# Patient Record
Sex: Female | Born: 1935
Health system: Southern US, Community
[De-identification: ages and names within clinical notes are randomized; demographics above are authoritative.]

## PROBLEM LIST (undated history)

## (undated) DIAGNOSIS — I10 Essential (primary) hypertension: Secondary | ICD-10-CM

## (undated) DIAGNOSIS — N200 Calculus of kidney: Secondary | ICD-10-CM

## (undated) DIAGNOSIS — I2510 Atherosclerotic heart disease of native coronary artery without angina pectoris: Secondary | ICD-10-CM

## (undated) DIAGNOSIS — Z8619 Personal history of other infectious and parasitic diseases: Secondary | ICD-10-CM

## (undated) DIAGNOSIS — M1990 Unspecified osteoarthritis, unspecified site: Secondary | ICD-10-CM

## (undated) DIAGNOSIS — R42 Dizziness and giddiness: Secondary | ICD-10-CM

## (undated) DIAGNOSIS — E785 Hyperlipidemia, unspecified: Secondary | ICD-10-CM

## (undated) DIAGNOSIS — C50919 Malignant neoplasm of unspecified site of unspecified female breast: Secondary | ICD-10-CM

## (undated) DIAGNOSIS — E119 Type 2 diabetes mellitus without complications: Secondary | ICD-10-CM

## (undated) DIAGNOSIS — K449 Diaphragmatic hernia without obstruction or gangrene: Secondary | ICD-10-CM

## (undated) DIAGNOSIS — I493 Ventricular premature depolarization: Secondary | ICD-10-CM

## (undated) DIAGNOSIS — K5790 Diverticulosis of intestine, part unspecified, without perforation or abscess without bleeding: Secondary | ICD-10-CM

## (undated) HISTORY — DX: Malignant neoplasm of unspecified site of unspecified female breast: C50.919

## (undated) HISTORY — DX: Atherosclerotic heart disease of native coronary artery without angina pectoris: I25.10

## (undated) HISTORY — DX: Calculus of kidney: N20.0

## (undated) HISTORY — PX: OTHER SURGICAL HISTORY: SHX169

## (undated) HISTORY — DX: Diaphragmatic hernia without obstruction or gangrene: K44.9

## (undated) HISTORY — PX: JOINT REPLACEMENT: SHX530

## (undated) HISTORY — DX: Ventricular premature depolarization: I49.3

## (undated) HISTORY — DX: Diverticulosis of intestine, part unspecified, without perforation or abscess without bleeding: K57.90

## (undated) HISTORY — DX: Essential (primary) hypertension: I10

## (undated) HISTORY — DX: Type 2 diabetes mellitus without complications: E11.9

## (undated) HISTORY — PX: CHOLECYSTECTOMY: SHX55

## (undated) HISTORY — DX: Hyperlipidemia, unspecified: E78.5

---

## 1999-02-13 DIAGNOSIS — C50919 Malignant neoplasm of unspecified site of unspecified female breast: Secondary | ICD-10-CM

## 1999-02-13 HISTORY — DX: Malignant neoplasm of unspecified site of unspecified female breast: C50.919

## 1999-02-13 HISTORY — PX: BREAST LUMPECTOMY: SHX2

## 1999-04-04 ENCOUNTER — Encounter: Payer: Self-pay | Admitting: Emergency Medicine

## 1999-04-04 ENCOUNTER — Emergency Department (HOSPITAL_COMMUNITY): Admission: EM | Admit: 1999-04-04 | Discharge: 1999-04-04 | Payer: Self-pay | Admitting: Emergency Medicine

## 1999-06-21 ENCOUNTER — Encounter: Admission: RE | Admit: 1999-06-21 | Discharge: 1999-07-18 | Payer: Self-pay | Admitting: Orthopedic Surgery

## 1999-10-18 ENCOUNTER — Encounter: Admission: RE | Admit: 1999-10-18 | Discharge: 1999-10-18 | Payer: Self-pay

## 1999-10-24 ENCOUNTER — Other Ambulatory Visit: Admission: RE | Admit: 1999-10-24 | Discharge: 1999-10-24 | Payer: Self-pay

## 1999-10-24 ENCOUNTER — Encounter: Admission: RE | Admit: 1999-10-24 | Discharge: 1999-10-24 | Payer: Self-pay | Admitting: Gynecology

## 1999-10-31 ENCOUNTER — Encounter: Admission: RE | Admit: 1999-10-31 | Discharge: 1999-10-31 | Payer: Self-pay | Admitting: General Surgery

## 1999-10-31 ENCOUNTER — Encounter: Payer: Self-pay | Admitting: General Surgery

## 1999-11-01 ENCOUNTER — Encounter: Payer: Self-pay | Admitting: General Surgery

## 1999-11-01 ENCOUNTER — Ambulatory Visit (HOSPITAL_BASED_OUTPATIENT_CLINIC_OR_DEPARTMENT_OTHER): Admission: RE | Admit: 1999-11-01 | Discharge: 1999-11-01 | Payer: Self-pay | Admitting: General Surgery

## 1999-11-01 ENCOUNTER — Encounter (INDEPENDENT_AMBULATORY_CARE_PROVIDER_SITE_OTHER): Payer: Self-pay | Admitting: *Deleted

## 1999-11-15 ENCOUNTER — Encounter: Admission: RE | Admit: 1999-11-15 | Discharge: 2000-02-13 | Payer: Self-pay | Admitting: Radiation Oncology

## 1999-11-17 ENCOUNTER — Ambulatory Visit (HOSPITAL_COMMUNITY): Admission: RE | Admit: 1999-11-17 | Discharge: 1999-11-17 | Payer: Self-pay | Admitting: *Deleted

## 1999-11-17 ENCOUNTER — Encounter: Payer: Self-pay | Admitting: *Deleted

## 1999-11-20 ENCOUNTER — Encounter: Payer: Self-pay | Admitting: *Deleted

## 1999-11-20 ENCOUNTER — Ambulatory Visit (HOSPITAL_COMMUNITY): Admission: RE | Admit: 1999-11-20 | Discharge: 1999-11-20 | Payer: Self-pay | Admitting: *Deleted

## 1999-11-21 ENCOUNTER — Encounter: Admission: RE | Admit: 1999-11-21 | Discharge: 2000-02-19 | Payer: Self-pay | Admitting: Dentistry

## 1999-11-22 ENCOUNTER — Encounter (HOSPITAL_COMMUNITY): Payer: Self-pay | Admitting: Dentistry

## 1999-12-04 ENCOUNTER — Ambulatory Visit (HOSPITAL_BASED_OUTPATIENT_CLINIC_OR_DEPARTMENT_OTHER): Admission: RE | Admit: 1999-12-04 | Discharge: 1999-12-04 | Payer: Self-pay | Admitting: General Surgery

## 1999-12-04 ENCOUNTER — Encounter: Payer: Self-pay | Admitting: General Surgery

## 2000-05-04 ENCOUNTER — Encounter: Admission: RE | Admit: 2000-05-04 | Discharge: 2000-08-02 | Payer: Self-pay | Admitting: Radiation Oncology

## 2000-05-14 ENCOUNTER — Encounter: Admission: RE | Admit: 2000-05-14 | Discharge: 2000-05-14 | Payer: Self-pay | Admitting: Radiation Oncology

## 2000-05-27 ENCOUNTER — Ambulatory Visit (HOSPITAL_BASED_OUTPATIENT_CLINIC_OR_DEPARTMENT_OTHER): Admission: RE | Admit: 2000-05-27 | Discharge: 2000-05-27 | Payer: Self-pay | Admitting: General Surgery

## 2000-10-03 ENCOUNTER — Other Ambulatory Visit: Admission: RE | Admit: 2000-10-03 | Discharge: 2000-10-03 | Payer: Self-pay | Admitting: Obstetrics and Gynecology

## 2000-10-21 ENCOUNTER — Encounter: Admission: RE | Admit: 2000-10-21 | Discharge: 2000-10-21 | Payer: Self-pay | Admitting: Surgery

## 2000-10-21 ENCOUNTER — Encounter (HOSPITAL_COMMUNITY): Payer: Self-pay | Admitting: Oncology

## 2000-11-15 ENCOUNTER — Encounter (HOSPITAL_COMMUNITY): Payer: Self-pay | Admitting: Oncology

## 2000-11-15 ENCOUNTER — Ambulatory Visit (HOSPITAL_COMMUNITY): Admission: RE | Admit: 2000-11-15 | Discharge: 2000-11-15 | Payer: Self-pay | Admitting: Oncology

## 2001-02-12 HISTORY — PX: BREAST LUMPECTOMY WITH AXILLARY LYMPH NODE DISSECTION: SHX5756

## 2001-07-16 ENCOUNTER — Ambulatory Visit (HOSPITAL_COMMUNITY): Admission: RE | Admit: 2001-07-16 | Discharge: 2001-07-16 | Payer: Self-pay | Admitting: *Deleted

## 2001-07-30 ENCOUNTER — Encounter: Payer: Self-pay | Admitting: Orthopedic Surgery

## 2001-07-30 ENCOUNTER — Inpatient Hospital Stay (HOSPITAL_COMMUNITY): Admission: RE | Admit: 2001-07-30 | Discharge: 2001-08-07 | Payer: Self-pay | Admitting: Orthopedic Surgery

## 2001-09-19 ENCOUNTER — Encounter (HOSPITAL_COMMUNITY): Payer: Self-pay | Admitting: Oncology

## 2001-09-19 ENCOUNTER — Ambulatory Visit (HOSPITAL_COMMUNITY): Admission: RE | Admit: 2001-09-19 | Discharge: 2001-09-19 | Payer: Self-pay | Admitting: Oncology

## 2001-11-03 ENCOUNTER — Encounter (HOSPITAL_COMMUNITY): Payer: Self-pay | Admitting: Oncology

## 2001-11-03 ENCOUNTER — Encounter: Admission: RE | Admit: 2001-11-03 | Discharge: 2001-11-03 | Payer: Self-pay | Admitting: Oncology

## 2002-09-22 ENCOUNTER — Ambulatory Visit (HOSPITAL_COMMUNITY): Admission: RE | Admit: 2002-09-22 | Discharge: 2002-09-22 | Payer: Self-pay | Admitting: Oncology

## 2002-09-22 ENCOUNTER — Encounter (HOSPITAL_COMMUNITY): Payer: Self-pay | Admitting: Oncology

## 2002-10-02 ENCOUNTER — Encounter (HOSPITAL_COMMUNITY): Payer: Self-pay | Admitting: Oncology

## 2002-10-02 ENCOUNTER — Ambulatory Visit (HOSPITAL_COMMUNITY): Admission: RE | Admit: 2002-10-02 | Discharge: 2002-10-02 | Payer: Self-pay | Admitting: Oncology

## 2002-11-11 ENCOUNTER — Encounter (HOSPITAL_COMMUNITY): Payer: Self-pay | Admitting: Oncology

## 2002-11-11 ENCOUNTER — Encounter: Admission: RE | Admit: 2002-11-11 | Discharge: 2002-11-11 | Payer: Self-pay | Admitting: Oncology

## 2003-09-21 ENCOUNTER — Ambulatory Visit (HOSPITAL_COMMUNITY): Admission: RE | Admit: 2003-09-21 | Discharge: 2003-09-21 | Payer: Self-pay | Admitting: Oncology

## 2003-11-15 ENCOUNTER — Encounter: Admission: RE | Admit: 2003-11-15 | Discharge: 2003-11-15 | Payer: Self-pay | Admitting: Oncology

## 2004-03-17 ENCOUNTER — Ambulatory Visit: Payer: Self-pay | Admitting: Oncology

## 2004-09-15 ENCOUNTER — Ambulatory Visit: Payer: Self-pay | Admitting: Oncology

## 2004-09-18 ENCOUNTER — Ambulatory Visit (HOSPITAL_COMMUNITY): Admission: RE | Admit: 2004-09-18 | Discharge: 2004-09-18 | Payer: Self-pay | Admitting: Oncology

## 2004-11-14 ENCOUNTER — Encounter: Admission: RE | Admit: 2004-11-14 | Discharge: 2004-11-14 | Payer: Self-pay | Admitting: Oncology

## 2005-03-28 ENCOUNTER — Ambulatory Visit: Payer: Self-pay | Admitting: Oncology

## 2005-04-17 ENCOUNTER — Ambulatory Visit (HOSPITAL_COMMUNITY): Admission: RE | Admit: 2005-04-17 | Discharge: 2005-04-17 | Payer: Self-pay | Admitting: Oncology

## 2005-10-09 ENCOUNTER — Ambulatory Visit: Payer: Self-pay | Admitting: Oncology

## 2005-10-11 LAB — CBC WITH DIFFERENTIAL/PLATELET
Basophils Absolute: 0 10*3/uL (ref 0.0–0.1)
EOS%: 3 % (ref 0.0–7.0)
HGB: 12.8 g/dL (ref 11.6–15.9)
LYMPH%: 24.7 % (ref 14.0–48.0)
MCH: 31.8 pg (ref 26.0–34.0)
MCV: 92.7 fL (ref 81.0–101.0)
MONO%: 7.7 % (ref 0.0–13.0)
RBC: 4.04 10*6/uL (ref 3.70–5.32)
RDW: 13.5 % (ref 11.3–14.5)

## 2005-10-11 LAB — COMPREHENSIVE METABOLIC PANEL
AST: 22 U/L (ref 0–37)
Albumin: 4.4 g/dL (ref 3.5–5.2)
Alkaline Phosphatase: 93 U/L (ref 39–117)
BUN: 19 mg/dL (ref 6–23)
Potassium: 4 mEq/L (ref 3.5–5.3)
Total Bilirubin: 0.5 mg/dL (ref 0.3–1.2)

## 2005-10-17 ENCOUNTER — Ambulatory Visit (HOSPITAL_COMMUNITY): Admission: RE | Admit: 2005-10-17 | Discharge: 2005-10-17 | Payer: Self-pay | Admitting: Oncology

## 2005-11-16 ENCOUNTER — Encounter: Admission: RE | Admit: 2005-11-16 | Discharge: 2005-11-16 | Payer: Self-pay | Admitting: Oncology

## 2006-04-08 ENCOUNTER — Ambulatory Visit: Payer: Self-pay | Admitting: Oncology

## 2006-04-11 LAB — CBC WITH DIFFERENTIAL/PLATELET
Basophils Absolute: 0 10*3/uL (ref 0.0–0.1)
Eosinophils Absolute: 0.1 10*3/uL (ref 0.0–0.5)
HCT: 38.3 % (ref 34.8–46.6)
HGB: 13.3 g/dL (ref 11.6–15.9)
LYMPH%: 25.3 % (ref 14.0–48.0)
MCV: 90.6 fL (ref 81.0–101.0)
MONO#: 0.3 10*3/uL (ref 0.1–0.9)
MONO%: 5.6 % (ref 0.0–13.0)
NEUT#: 3.2 10*3/uL (ref 1.5–6.5)
NEUT%: 66.3 % (ref 39.6–76.8)
Platelets: 311 10*3/uL (ref 145–400)
RBC: 4.22 10*6/uL (ref 3.70–5.32)

## 2006-04-11 LAB — COMPREHENSIVE METABOLIC PANEL
Alkaline Phosphatase: 105 U/L (ref 39–117)
BUN: 17 mg/dL (ref 6–23)
CO2: 24 mEq/L (ref 19–32)
Glucose, Bld: 190 mg/dL — ABNORMAL HIGH (ref 70–99)
Sodium: 141 mEq/L (ref 135–145)
Total Bilirubin: 0.7 mg/dL (ref 0.3–1.2)
Total Protein: 7 g/dL (ref 6.0–8.3)

## 2006-04-11 LAB — LACTATE DEHYDROGENASE: LDH: 138 U/L (ref 94–250)

## 2006-10-08 ENCOUNTER — Ambulatory Visit: Payer: Self-pay | Admitting: Oncology

## 2006-10-10 LAB — CBC WITH DIFFERENTIAL/PLATELET
BASO%: 0.3 % (ref 0.0–2.0)
Eosinophils Absolute: 0.2 10*3/uL (ref 0.0–0.5)
HCT: 37 % (ref 34.8–46.6)
MCHC: 35.5 g/dL (ref 32.0–36.0)
MONO#: 0.4 10*3/uL (ref 0.1–0.9)
NEUT#: 3.1 10*3/uL (ref 1.5–6.5)
NEUT%: 61.9 % (ref 39.6–76.8)
Platelets: 334 10*3/uL (ref 145–400)
RBC: 4.07 10*6/uL (ref 3.70–5.32)
WBC: 5 10*3/uL (ref 3.9–10.0)
lymph#: 1.4 10*3/uL (ref 0.9–3.3)

## 2006-10-10 LAB — COMPREHENSIVE METABOLIC PANEL
ALT: 18 U/L (ref 0–35)
CO2: 23 mEq/L (ref 19–32)
Calcium: 10.3 mg/dL (ref 8.4–10.5)
Chloride: 103 mEq/L (ref 96–112)
Glucose, Bld: 147 mg/dL — ABNORMAL HIGH (ref 70–99)
Sodium: 141 mEq/L (ref 135–145)
Total Protein: 6.9 g/dL (ref 6.0–8.3)

## 2006-10-10 LAB — LACTATE DEHYDROGENASE: LDH: 130 U/L (ref 94–250)

## 2006-10-17 ENCOUNTER — Ambulatory Visit (HOSPITAL_COMMUNITY): Admission: RE | Admit: 2006-10-17 | Discharge: 2006-10-17 | Payer: Self-pay | Admitting: Oncology

## 2006-11-18 ENCOUNTER — Encounter: Admission: RE | Admit: 2006-11-18 | Discharge: 2006-11-18 | Payer: Self-pay | Admitting: Oncology

## 2007-02-13 HISTORY — PX: KNEE ARTHROPLASTY: SHX992

## 2007-04-16 ENCOUNTER — Inpatient Hospital Stay (HOSPITAL_COMMUNITY): Admission: RE | Admit: 2007-04-16 | Discharge: 2007-04-21 | Payer: Self-pay | Admitting: Orthopedic Surgery

## 2007-04-21 ENCOUNTER — Encounter: Payer: Self-pay | Admitting: Internal Medicine

## 2007-05-21 ENCOUNTER — Ambulatory Visit: Payer: Self-pay | Admitting: Oncology

## 2007-05-23 LAB — CBC WITH DIFFERENTIAL/PLATELET
BASO%: 0.3 % (ref 0.0–2.0)
EOS%: 2.6 % (ref 0.0–7.0)
HGB: 12.1 g/dL (ref 11.6–15.9)
MCH: 31.3 pg (ref 26.0–34.0)
MCHC: 34.7 g/dL (ref 32.0–36.0)
RDW: 13.8 % (ref 11.3–14.5)
lymph#: 1.3 10*3/uL (ref 0.9–3.3)

## 2007-05-23 LAB — COMPREHENSIVE METABOLIC PANEL
ALT: 14 U/L (ref 0–35)
AST: 15 U/L (ref 0–37)
Albumin: 4.5 g/dL (ref 3.5–5.2)
Calcium: 9.7 mg/dL (ref 8.4–10.5)
Chloride: 103 mEq/L (ref 96–112)
Potassium: 4.2 mEq/L (ref 3.5–5.3)

## 2007-05-28 ENCOUNTER — Encounter: Payer: Self-pay | Admitting: Orthopedic Surgery

## 2007-06-13 ENCOUNTER — Encounter: Payer: Self-pay | Admitting: Orthopedic Surgery

## 2007-07-14 ENCOUNTER — Encounter: Payer: Self-pay | Admitting: Orthopedic Surgery

## 2007-11-20 ENCOUNTER — Encounter: Admission: RE | Admit: 2007-11-20 | Discharge: 2007-11-20 | Payer: Self-pay

## 2007-11-27 ENCOUNTER — Ambulatory Visit: Payer: Self-pay | Admitting: Oncology

## 2007-12-01 ENCOUNTER — Ambulatory Visit (HOSPITAL_COMMUNITY): Admission: RE | Admit: 2007-12-01 | Discharge: 2007-12-01 | Payer: Self-pay | Admitting: Oncology

## 2007-12-01 LAB — COMPREHENSIVE METABOLIC PANEL
ALT: 23 U/L (ref 0–35)
CO2: 24 mEq/L (ref 19–32)
Calcium: 9.9 mg/dL (ref 8.4–10.5)
Chloride: 106 mEq/L (ref 96–112)
Creatinine, Ser: 0.94 mg/dL (ref 0.40–1.20)
Total Protein: 6.4 g/dL (ref 6.0–8.3)

## 2007-12-01 LAB — CBC WITH DIFFERENTIAL/PLATELET
BASO%: 0.3 % (ref 0.0–2.0)
Eosinophils Absolute: 0.2 10*3/uL (ref 0.0–0.5)
HCT: 35.4 % (ref 34.8–46.6)
HGB: 12.1 g/dL (ref 11.6–15.9)
MCHC: 34.1 g/dL (ref 32.0–36.0)
MONO#: 0.4 10*3/uL (ref 0.1–0.9)
NEUT#: 3.9 10*3/uL (ref 1.5–6.5)
NEUT%: 63.7 % (ref 39.6–76.8)
WBC: 6.1 10*3/uL (ref 3.9–10.0)
lymph#: 1.6 10*3/uL (ref 0.9–3.3)

## 2007-12-01 LAB — LACTATE DEHYDROGENASE: LDH: 123 U/L (ref 94–250)

## 2008-02-12 ENCOUNTER — Emergency Department (HOSPITAL_COMMUNITY): Admission: EM | Admit: 2008-02-12 | Discharge: 2008-02-12 | Payer: Self-pay | Admitting: Emergency Medicine

## 2008-02-26 ENCOUNTER — Ambulatory Visit (HOSPITAL_COMMUNITY): Admission: RE | Admit: 2008-02-26 | Discharge: 2008-02-26 | Payer: Self-pay

## 2008-02-26 ENCOUNTER — Ambulatory Visit: Payer: Self-pay | Admitting: Surgery

## 2008-05-27 ENCOUNTER — Ambulatory Visit: Payer: Self-pay | Admitting: Oncology

## 2008-05-31 LAB — COMPREHENSIVE METABOLIC PANEL
ALT: 25 U/L (ref 0–35)
AST: 18 U/L (ref 0–37)
Albumin: 4 g/dL (ref 3.5–5.2)
Alkaline Phosphatase: 93 U/L (ref 39–117)
BUN: 15 mg/dL (ref 6–23)
CO2: 24 mEq/L (ref 19–32)
Calcium: 9.3 mg/dL (ref 8.4–10.5)
Chloride: 106 mEq/L (ref 96–112)
Creatinine, Ser: 0.99 mg/dL (ref 0.40–1.20)
Glucose, Bld: 201 mg/dL — ABNORMAL HIGH (ref 70–99)
Potassium: 4.3 mEq/L (ref 3.5–5.3)
Sodium: 141 mEq/L (ref 135–145)
Total Bilirubin: 0.5 mg/dL (ref 0.3–1.2)
Total Protein: 6.1 g/dL (ref 6.0–8.3)

## 2008-05-31 LAB — CBC WITH DIFFERENTIAL/PLATELET
Basophils Absolute: 0 10*3/uL (ref 0.0–0.1)
Eosinophils Absolute: 0.2 10*3/uL (ref 0.0–0.5)
HCT: 34.2 % — ABNORMAL LOW (ref 34.8–46.6)
LYMPH%: 20.2 % (ref 14.0–49.7)
MCV: 92 fL (ref 79.5–101.0)
MONO#: 0.3 10*3/uL (ref 0.1–0.9)
MONO%: 6.1 % (ref 0.0–14.0)
NEUT#: 4 10*3/uL (ref 1.5–6.5)
NEUT%: 69.5 % (ref 38.4–76.8)
Platelets: 280 10*3/uL (ref 145–400)
WBC: 5.8 10*3/uL (ref 3.9–10.3)

## 2008-05-31 LAB — LACTATE DEHYDROGENASE: LDH: 126 U/L (ref 94–250)

## 2008-11-16 ENCOUNTER — Encounter: Admission: RE | Admit: 2008-11-16 | Discharge: 2008-11-16 | Payer: Self-pay | Admitting: Oncology

## 2009-02-08 ENCOUNTER — Emergency Department (HOSPITAL_COMMUNITY): Admission: EM | Admit: 2009-02-08 | Discharge: 2009-02-09 | Payer: Self-pay | Admitting: Emergency Medicine

## 2009-04-09 IMAGING — US US RENAL PORT
1 series · 14 of 17 positions shown · non-contrast
Comparison: None.

PORTABLE RENAL/URINARY TRACT ULTRASOUND:

CLINICAL DATA: Renal insufficiency
TECHNIQUE: Complete portable ultrasound examination of the urinary tract was
performed including evaluation of the kidneys, renal collecting systems, and
urinary bladder.

[Series 1: unknown · 0.27mm/px · 14 of 17 slices shown]
[im 1/17]
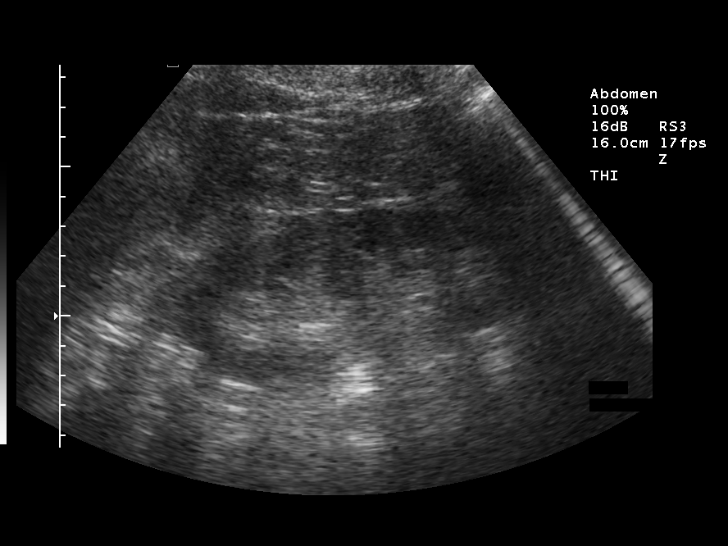
[im 2/17]
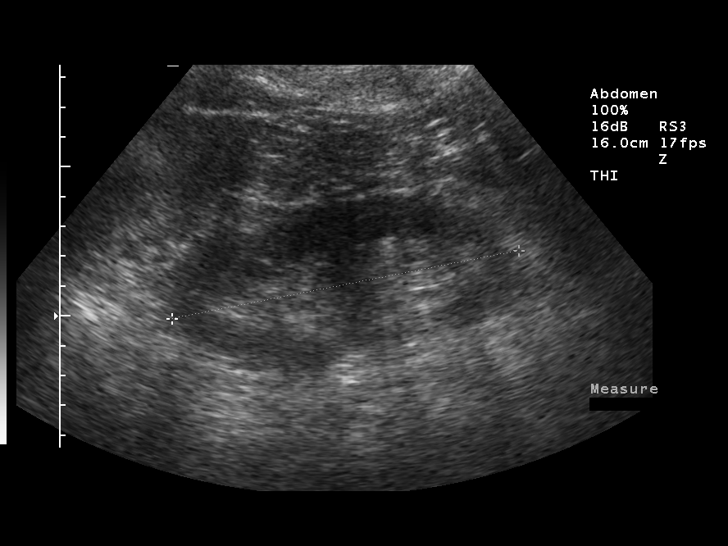
[im 4/17]
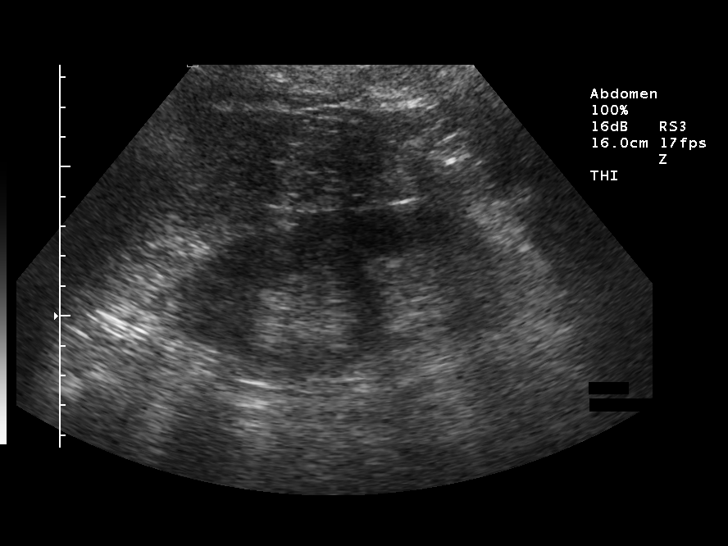
[im 5/17]
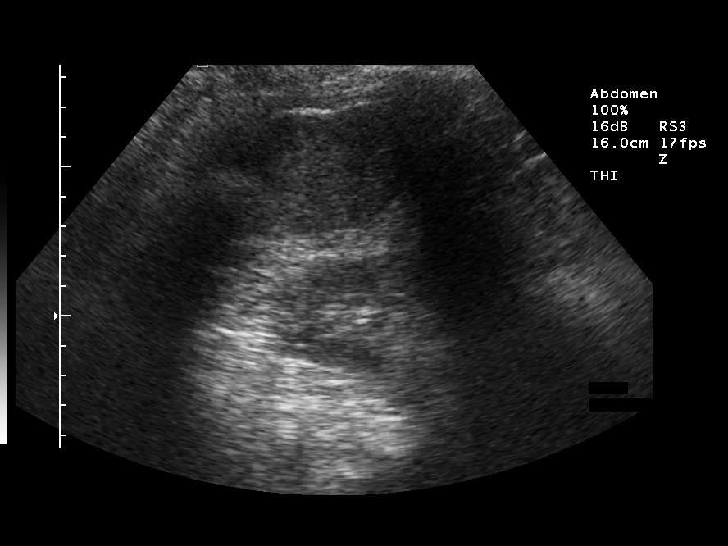
[im 6/17]
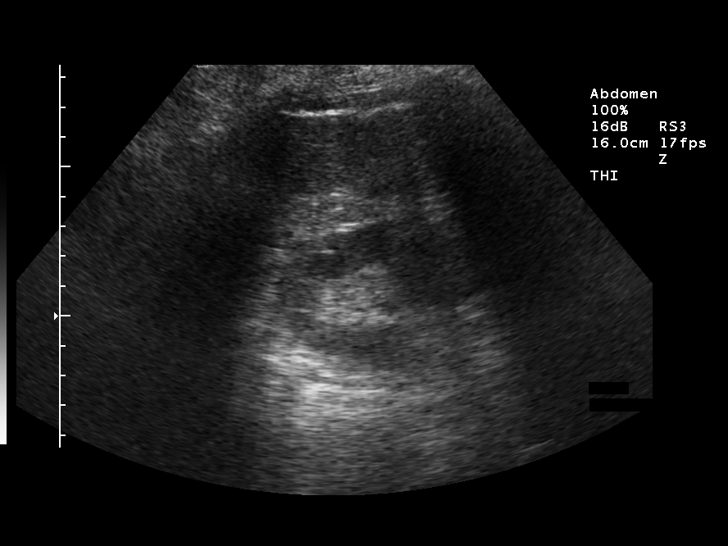
[im 7/17]
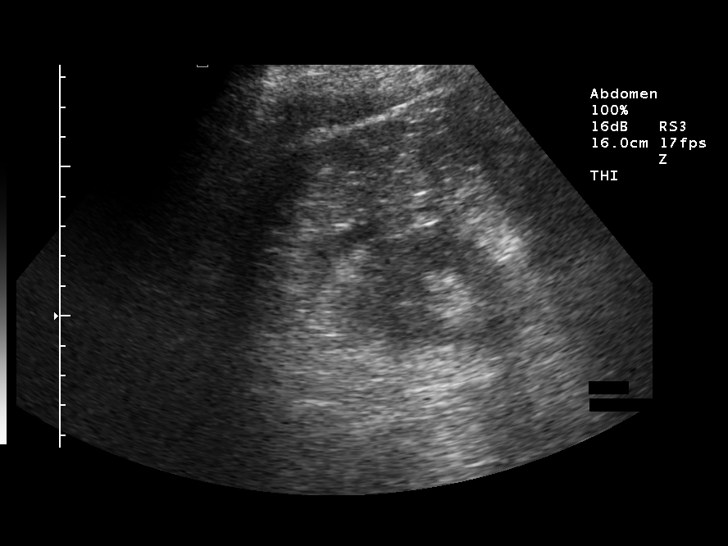
[im 8/17]
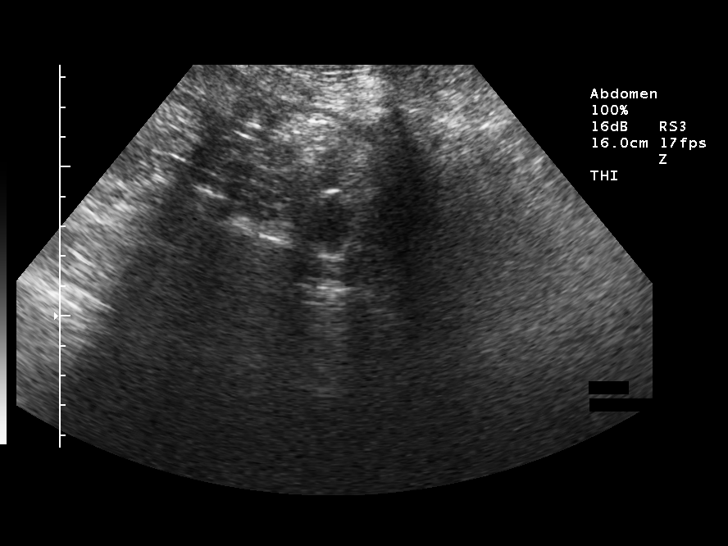
[im 10/17]
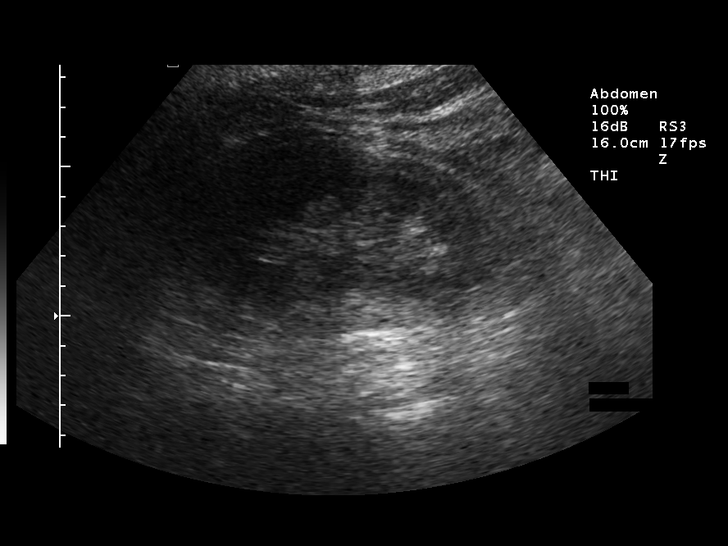
[im 11/17]
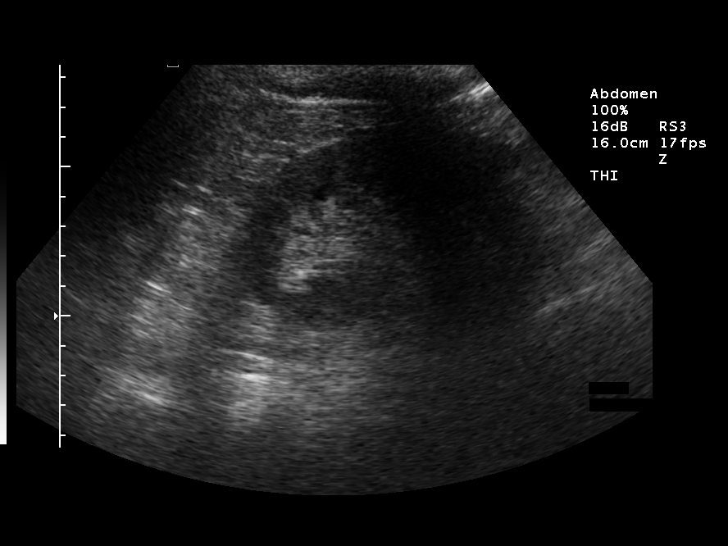
[im 12/17]
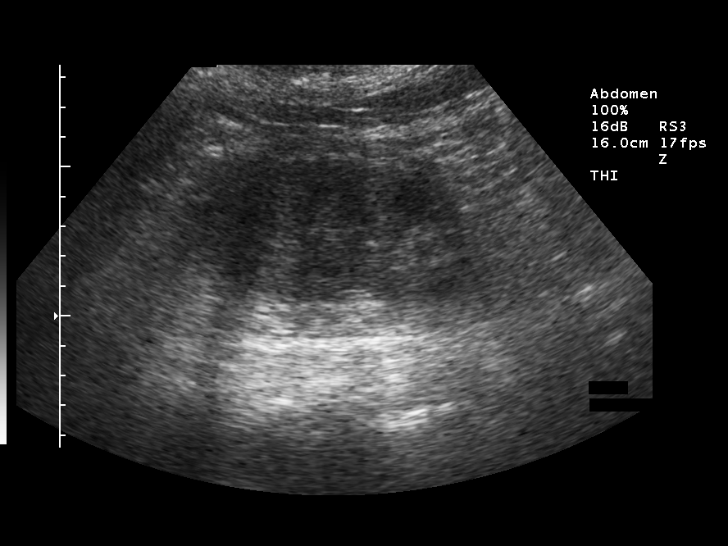
[im 13/17]
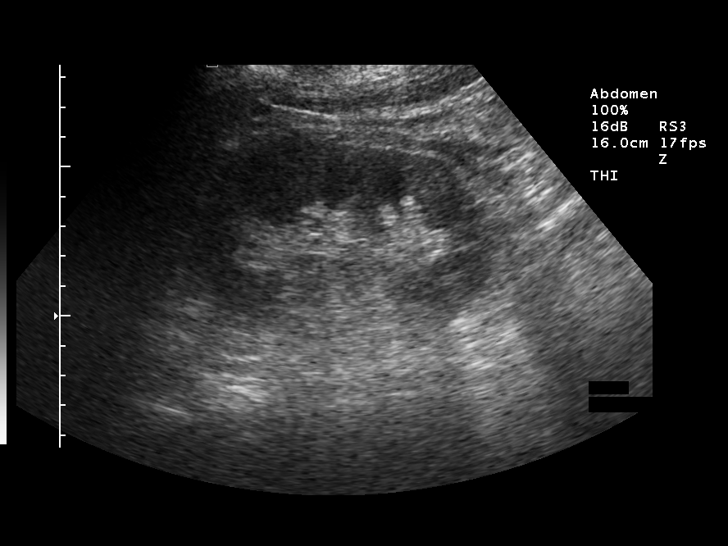
[im 14/17]
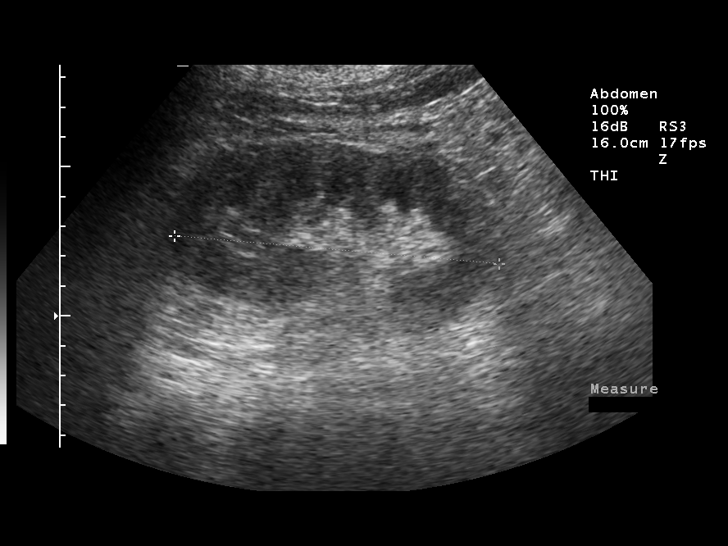
[im 16/17]
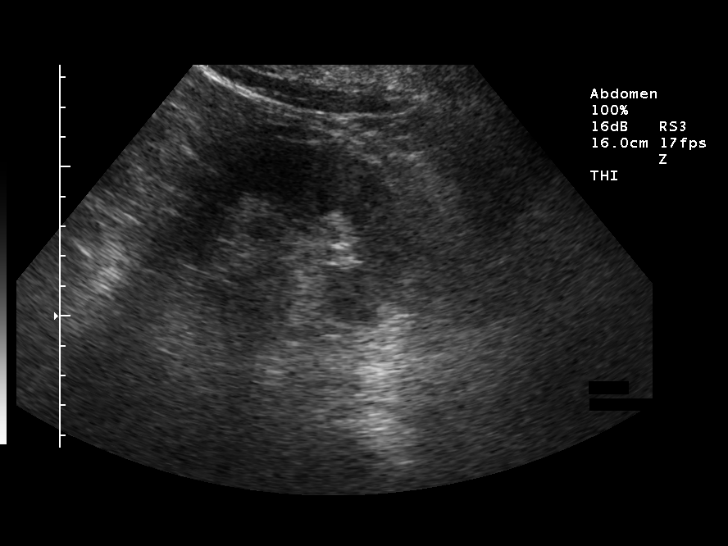
[im 17/17]
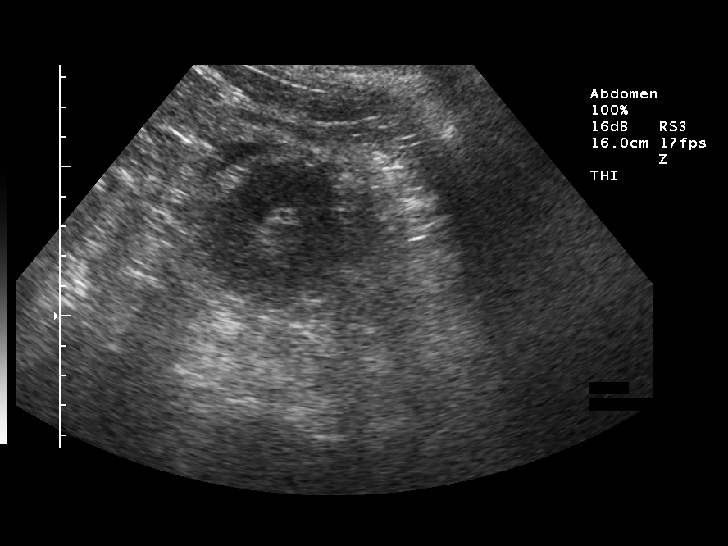

[14 of 17 positions shown; findings below may reference images not displayed]

FINDINGS: The right kidney measures 11.8 cm in long axis. Renal cortical
echogenicity is within normal limits. There is no hydronephrosis. No renal mass.

The left kidney measures 10.9 cm in long axis. Sonographic features are normal.

Midline imaging of the anatomic pelvis shows a Foley balloon with a decompressed
urinary bladder.
IMPRESSION: No evidence for hydronephrosis.

## 2009-05-26 ENCOUNTER — Ambulatory Visit: Payer: Self-pay | Admitting: Oncology

## 2009-05-30 LAB — CBC WITH DIFFERENTIAL/PLATELET
BASO%: 0.3 % (ref 0.0–2.0)
Eosinophils Absolute: 0.1 10*3/uL (ref 0.0–0.5)
MCHC: 34.3 g/dL (ref 31.5–36.0)
MONO#: 0.3 10*3/uL (ref 0.1–0.9)
NEUT#: 3.3 10*3/uL (ref 1.5–6.5)
RBC: 3.81 10*6/uL (ref 3.70–5.45)
RDW: 13.5 % (ref 11.2–14.5)
WBC: 5.1 10*3/uL (ref 3.9–10.3)
lymph#: 1.3 10*3/uL (ref 0.9–3.3)

## 2009-05-30 LAB — COMPREHENSIVE METABOLIC PANEL
ALT: 21 U/L (ref 0–35)
Albumin: 4.3 g/dL (ref 3.5–5.2)
Alkaline Phosphatase: 99 U/L (ref 39–117)
CO2: 25 mEq/L (ref 19–32)
Calcium: 9.4 mg/dL (ref 8.4–10.5)
Chloride: 102 mEq/L (ref 96–112)
Glucose, Bld: 158 mg/dL — ABNORMAL HIGH (ref 70–99)
Potassium: 4 mEq/L (ref 3.5–5.3)
Sodium: 139 mEq/L (ref 135–145)
Total Bilirubin: 0.5 mg/dL (ref 0.3–1.2)
Total Protein: 6.6 g/dL (ref 6.0–8.3)

## 2009-05-30 LAB — LACTATE DEHYDROGENASE: LDH: 125 U/L (ref 94–250)

## 2009-06-16 HISTORY — PX: CARDIOVASCULAR STRESS TEST: SHX262

## 2009-06-16 HISTORY — PX: TRANSTHORACIC ECHOCARDIOGRAM: SHX275

## 2009-07-13 ENCOUNTER — Ambulatory Visit (HOSPITAL_COMMUNITY): Admission: RE | Admit: 2009-07-13 | Discharge: 2009-07-13 | Payer: Self-pay | Admitting: Cardiovascular Disease

## 2009-07-13 HISTORY — PX: CARDIAC CATHETERIZATION: SHX172

## 2009-11-17 ENCOUNTER — Encounter: Admission: RE | Admit: 2009-11-17 | Discharge: 2009-11-17 | Payer: Self-pay | Admitting: Oncology

## 2010-05-01 LAB — URINE MICROSCOPIC-ADD ON

## 2010-05-01 LAB — URINE CULTURE: Colony Count: >=100000

## 2010-05-01 LAB — URINALYSIS, ROUTINE W REFLEX MICROSCOPIC
Bilirubin Urine: NEGATIVE
Glucose, UA: NEGATIVE mg/dL
Hgb urine dipstick: NEGATIVE
Ketones, ur: NEGATIVE mg/dL
Nitrite: NEGATIVE
Protein, ur: NEGATIVE mg/dL
Specific Gravity, Urine: 1.016 (ref 1.005–1.030)
Urobilinogen, UA: 0.2 mg/dL (ref 0.0–1.0)
pH: 5 (ref 5.0–8.0)

## 2010-05-15 LAB — CBC
HCT: 39.3 % (ref 36.0–46.0)
Hemoglobin: 13.4 g/dL (ref 12.0–15.0)
MCV: 95.2 fL (ref 78.0–100.0)
Platelets: 277 10*3/uL (ref 150–400)
WBC: 11.6 10*3/uL — ABNORMAL HIGH (ref 4.0–10.5)

## 2010-05-15 LAB — POCT I-STAT, CHEM 8
BUN: 16 mg/dL (ref 6–23)
Calcium, Ion: 1.25 mmol/L (ref 1.12–1.32)
Creatinine, Ser: 1 mg/dL (ref 0.4–1.2)
Glucose, Bld: 126 mg/dL — ABNORMAL HIGH (ref 70–99)
Hemoglobin: 13.3 g/dL (ref 12.0–15.0)
Sodium: 139 mEq/L (ref 135–145)
TCO2: 26 mmol/L (ref 0–100)

## 2010-05-15 LAB — URINE CULTURE: Colony Count: >=100000

## 2010-05-15 LAB — URINALYSIS, ROUTINE W REFLEX MICROSCOPIC
Bilirubin Urine: NEGATIVE
Glucose, UA: NEGATIVE mg/dL
Hgb urine dipstick: NEGATIVE
Ketones, ur: NEGATIVE mg/dL
Protein, ur: NEGATIVE mg/dL
Urobilinogen, UA: 0.2 mg/dL (ref 0.0–1.0)

## 2010-05-15 LAB — DIFFERENTIAL
Eosinophils Absolute: 0.1 10*3/uL (ref 0.0–0.7)
Eosinophils Relative: 1 % (ref 0–5)
Lymphocytes Relative: 12 % (ref 12–46)
Lymphs Abs: 1.4 10*3/uL (ref 0.7–4.0)
Monocytes Absolute: 0.6 10*3/uL (ref 0.1–1.0)

## 2010-05-15 LAB — URINE MICROSCOPIC-ADD ON

## 2010-06-01 ENCOUNTER — Other Ambulatory Visit (HOSPITAL_COMMUNITY): Payer: Self-pay | Admitting: Oncology

## 2010-06-01 ENCOUNTER — Encounter (HOSPITAL_BASED_OUTPATIENT_CLINIC_OR_DEPARTMENT_OTHER): Payer: Medicare Other | Admitting: Oncology

## 2010-06-01 DIAGNOSIS — Z853 Personal history of malignant neoplasm of breast: Secondary | ICD-10-CM

## 2010-06-01 DIAGNOSIS — C50919 Malignant neoplasm of unspecified site of unspecified female breast: Secondary | ICD-10-CM

## 2010-06-01 LAB — CBC WITH DIFFERENTIAL/PLATELET
EOS%: 2.2 % (ref 0.0–7.0)
Eosinophils Absolute: 0.1 10*3/uL (ref 0.0–0.5)
LYMPH%: 19.9 % (ref 14.0–49.7)
MCH: 32.5 pg (ref 25.1–34.0)
MCV: 96 fL (ref 79.5–101.0)
MONO%: 6.9 % (ref 0.0–14.0)
NEUT#: 3.9 10*3/uL (ref 1.5–6.5)
Platelets: 329 10*3/uL (ref 145–400)
RBC: 3.79 10*6/uL (ref 3.70–5.45)
RDW: 14 % (ref 11.2–14.5)

## 2010-06-01 LAB — COMPREHENSIVE METABOLIC PANEL
AST: 21 U/L (ref 0–37)
Alkaline Phosphatase: 130 U/L — ABNORMAL HIGH (ref 39–117)
BUN: 17 mg/dL (ref 6–23)
Glucose, Bld: 177 mg/dL — ABNORMAL HIGH (ref 70–99)
Potassium: 3.8 mEq/L (ref 3.5–5.3)
Sodium: 142 mEq/L (ref 135–145)
Total Bilirubin: 0.6 mg/dL (ref 0.3–1.2)

## 2010-06-27 NOTE — H&P (Signed)
[name], [name]                  ACCOUNT NO.:  [id number]   MEDICAL RECORD NO.:  [id number]          PATIENT TYPE:  INP   LOCATION:  1620                         FACILITY:  [facility]   PHYSICIAN:  Ollen Gross, M.D.    DATE OF BIRTH:  11/18/1929   DATE OF ADMISSION:  04/16/2007  DATE OF DISCHARGE:                              HISTORY & PHYSICAL   CHIEF COMPLAINT:  Right knee pain.   HISTORY OF PRESENT ILLNESS:  The patient is a 77 year old female who has  been seen by Dr. Lequita Halt.  She has known end-stage arthritis.  She has  been treated conservatively in the past utilizing medications and even  injections.  Unfortunately, she has gone on to have increased pain over  time.  It was felt she had reached the point where she would benefit  undergoing surgical intervention.  Risk and benefits discussed.  The  patient was subsequently admitted to hospital.  She has been seen by her  medical physician, Dr. Vear Clock and felt to be stable for upcoming  surgery.   ALLERGIES:  No known drug allergies.   CURRENT MEDICATIONS:  Benazepril/hydrochlorothiazide, metformin,  Lipitor.   PAST MEDICAL HISTORY:  Hypertension, hypercholesterolemia, hiatal  hernia, history of dysphasia secondary to esophageal strictures,  diabetes mellitus, history of breast cancer.   PAST SURGICAL HISTORY:  Gallbladder surgery, left breast lumpectomy  secondary to cancer, knee replacement on the left per Dr. Priscille Kluver in  2003, also EGD with esophageal dilatation.   FAMILY HISTORY:  Father with history of diabetes.  Mother with history  of heart disease.   SOCIAL HISTORY:  Widowed, retired, nonsmoker, no alcohol.  Two children.  Lives alone, wants look into a skilled nursing facility postoperatively.   REVIEW OF SYSTEMS:  GENERAL:  No fevers, chills, night sweats.  NEUROLOGIC:  No seizure, syncope.  She does get occasional dizziness.  No blackout spells.  RESPIRATORY:  No shortness breath, productive cough or  hemoptysis.  CARDIOVASCULAR:  Chest pain or orthopnea.  GASTROINTESTINAL:  History of difficulty swallowing secondary dysphagia  and esophageal strictures in the past.  No nausea, vomiting, diarrhea,  constipation.  GENITOURINARY:  No dysuria, hematuria or discharge.  MUSCULOSKELETAL:  Right knee.   PHYSICAL EXAMINATION:  VITAL SIGNS:  Pulse 76, respirations 12, blood  pressure 132/68.  GENERAL:  A 77 year old white female, well nourished, well developed, no  acute distress, alert, oriented and cooperative, good historian.  HEENT:  Normocephalic, atraumatic.  Pupils are round and reactive.  Oropharynx clear.  EOMs intact.  NECK:  Supple.  CHEST:  Clear.  HEART:  Regular rate and rhythm, no murmur, S1 and S2.  ABDOMEN:  Soft, nontender, bowel sounds present, __________  EXTREMITIES:  Right knee varus malalignment deformity.  Range of motion  about 150, marked crepitus, tender more medial than lateral.   IMPRESSION:  1. Osteoarthritis, right knee.  2. Hypertension.  3. Hypercholesterolemia.  4. Hiatal hernia.  5. History of dysphagia secondary to esophageal stricture status post      dilatation.  6. Diabetes mellitus.  7. History of breast cancer status  post lumpectomy.   PLAN:  The patient admitted to [facility] to undergo right  total knee arthroplasty.  Surgery will be performed by Dr. Ollen Gross.      Alexzandrew L. Perkins, P.A.C.      Ollen Gross, M.D.  Electronically Signed    ALP/MEDQ  D:  04/17/2007  T:  04/17/2007  Job:  [id number]   cc:   Loma Sender  Fax: [phone]   Ollen Gross, M.D.  Fax: [phone]

## 2010-06-27 NOTE — Op Note (Signed)
NAMEFIDELIS, LOTH                  ACCOUNT NO.:  [id number]   MEDICAL RECORD NO.:  [id number]          PATIENT TYPE:  INP   LOCATION:  1620                         FACILITY:  [facility]   PHYSICIAN:  Ollen Gross, M.D.    DATE OF BIRTH:  [birth year]   DATE OF PROCEDURE:  04/16/2007  DATE OF DISCHARGE:                               OPERATIVE REPORT   PREOPERATIVE DIAGNOSIS:  Osteoarthritis, right knee.   POSTOPERATIVE DIAGNOSIS:  Osteoarthritis, right knee.   PROCEDURE:  Right total knee arthroplasty.   SURGEON:  Ollen Gross, M.D.   ASSISTANT:  Alexzandrew L. Perkins, P.A.C.   ANESTHESIA:  General with postop Marcaine pain pump.   ESTIMATED BLOOD LOSS:  300 mL.   DRAINS:  None.   TOURNIQUET TIME:  No tourniquet utilized.   COMPLICATIONS:  None.   CONDITION:  Stable to recovery.   BRIEF CLINICAL NOTE:  Ms. Lafountain is a 77 year old female with end stage  arthritis of the right knee with progressive worsening pain and  dysfunction.  She has failed nonoperative management including  injections and presents for total knee arthroplasty.   PROCEDURE IN DETAIL:  After the successful administration of general  anesthetic, a tourniquet was placed high on the right thigh, and the  right lower extremity prepped and draped in the usual sterile fashion.  The extremity was wrapped in an Esmarch, knee flexed, and tourniquet  inflated to 300 mmHg.  A midline incision was made with a 10 blade  through the subcutaneous tissue to the level of the extensor mechanism.  A fresh blade was used to make a medial parapatellar arthrotomy.  Soft  tissue of the proximal medial tibia was subperiosteally elevated to the  joint line with the knife and to the semimembranosus bursa with a Cobb  elevator.  The soft tissue laterally was elevated with attention being  paid to avoid the patellar tendon on tibial tubercle.  ?The patella was  subluxed laterally, knee flexed 90 degrees, and ACL and PCL were  removed.  At this point, it was obvious that the tourniquet was not  functioning.  We thus deflated the tourniquet and went without it for  the rest of the case.  A drill was used to create a starting hole in the  distal femur.  The canal was thoroughly irrigated.  The 5 degrees right  valgus alignment guide was placed and referencing off the posterior  condyles, rotation was marked and a block pinned to remove 10 mm off the  distal femur.  Distal femoral resection is made with an oscillating saw.  Sizing blocks are placed, size 3 is the most appropriate.  Rotation is  marked off the epicondylar axis.  Size 3 cutting block is placed and the  anterior, posterior, and chamfer cuts were made.   The tibia was subluxed forward and the menisci removed.  The  extramedullary tibial alignment guide was placed referencing proximally  at the medial aspect of the tibial tubercle and distally along the  second metatarsal axis and the tibial crest.  The block was pinned to  remove  10 mm from the non-deficient lateral side.  Tibial resection is  made with an oscillating saw.  The size 3 is the most appropriate tibial  component and the proximal tibia is prepared with the modular drill and  keel punch for a size 3.  Femoral preparation is completed with an  intercondylar cut.   Size 3 mobile bearing tibial trial, size 3 posterior stabilized femoral  trial, and a 10 mm posterior stabilized rotating platform insert trial  is placed.  With the 10, she hyperextended a little bit and had some AP  play.  We went to 12.5 which allowed for full extension with excellent  varus valgus anterior and posterior balance.  The patella was then  everted and measured to be 23 mm.  Freehand resection was taken to 13  mm, 35 template is placed, lug holes were drilled, trial patella was  placed, and it tracks normally.  Osteophytes were removed off the  posterior femur with the trial in place.  All trials were removed and   the cut bone surfaces are prepared with pulsatile lavage.  The cement  was mixed and once ready for implantation, the size 3 mobile bearing  tibial tray, size 3 posterior stabilized femur, and 35 patella are  cemented into place and the patella was held with a clamp.  The trial  12.5 insert was placed, the knee held in full extension, and all  extruded cement removed.  Once the cement was fully hardened, then the  wound was copiously irrigated with saline solution and the permanent  12.5 mm posterior stabilized rotating platform insert is placed in the  tibial tray.  We then injected FloSeal in the medial and lateral gutters  and suprapatellar area.  A moist sponge is placed and held for about two  minutes.  It is then removed and the bleeding was essentially stopped.  There was minimal bleeding identified.  We then thoroughly irrigated  again with saline and then closed the arthrotomy with interrupted #1  PDS.  Flexion against gravity to 135 degrees.  The subcu is then closed  with interrupted 2-0 Vicryl and subcuticular running 4-0 Monocryl.  The  catheter for the Marcaine pain pump is placed and the pump is initiated.  Steri-Strips and a bulky sterile dressing were applied.  She is then  placed into a knee immobilizer, awakened, and transferred to recovery in  stable condition.      Ollen Gross, M.D.  Electronically Signed     FA/MEDQ  D:  04/16/2007  T:  04/16/2007  Job:  [id number]

## 2010-06-27 NOTE — Consult Note (Signed)
NAMESTEFANIE, [name]                  ACCOUNT NO.:  [id number]   MEDICAL RECORD NO.:  [id number]          PATIENT TYPE:  INP   LOCATION:  1620                         FACILITY:  [facility]   PHYSICIAN:  James L. Deterding, M.D.DATE OF BIRTH:  1929/02/15   DATE OF CONSULTATION:  DATE OF DISCHARGE:                                 CONSULTATION   REASON FOR CONSULT:  Acute kidney injury.   HISTORY OF PRESENT ILLNESS:  This 77 year old female with a history of  diabetes mellitus about 7 years, history of hypertension over 20 years,  breast cancer 6 or 7 years ago, history of left total replacement 6  years ago.  Now with a right total knee replacement for osteoarthritis.  She has a history of also hyperlipidemia.   HOME MEDICATIONS:  1. Benazepril and hydrochlorothiazide 1 per day.  2. Metformin 850 mg twice a day.  3. Lipitor 10 mg a day.  4. Question if she is on Tamoxifen.   Creatinine was 0.89 on April 08, 2007 and now it is 3.1 on April 17, 2007.  She takes ibuprofen regularly, but has not taken it for 10 days  and home blood pressure intraoperatively was 162/100 systolic.  It  dropped to 100 postoperatively last p.m.   No family history of renal disease.  No family history of hearing  defect, inherited eye defects.  There is a family history of stones.  She had __________ in the past and happened over 20 years.  She has  occasional ankle edema, no PND, orthopnea, no chest pain.  Chest  nocturia x1.  She has had some itching now.   PAST MEDICAL HISTORY:  Includes the medicines above.  She has had a  mastectomy.  She has had gallbladder resection 1978.  The mastectomy was  in 1991.  She also had cataracts done also and both knees done.  She has  had a skin cancer resected.   FAMILY HISTORY:  She is 1 of 9 children.  Only 3 are alive now.  She had  2 brothers who died of stroke, one brother who died of heart attack, 1  sister who died of cancer, and another sister died of  complications of  broken hip.  Father died of diabetic complications at age 57.  Mother  died at age 55 of heart disease.  She is widowed, and retired from [employer] at the current time.  She is a nonsmoker, nondrinker.   REVIEW OF SYSTEMS:  HEENT:  She denies headaches.  Denies visual  difficulties.  She wears glasses.  No hearing difficulties.  LUNGS:  She  has had a nonproductive cough without sputum production.  She has had  difficulty swallowing, and frequently she had to have esophageal  dilation.  No diarrhea or constipation.  No history of hepatitis or  jaundice.  SKIN:  Skin cancer unremarkable.  MUSCULOSKELETAL:  She has  arthritis in her shoulders and the knees and some in her hands.  NEUROLOGICAL:  Negative.   PHYSICAL EXAMINATION:  She is oriented x3, very pleasant.  Blood glucose  is 140 to 211.  98.6 temp, 134/55 blood pressure supine.  Urine output  has been 400 mL in the last shift.  HEENT:  Shows no funduscopic abnormalities.  Pharynx unremarkable.  NECK:  Without thyromegaly or masses.  LUNGS:  Reveal a few rhonchi, slightly decreased breath sounds with no  rales or wheezes.  Normal to percussion.  ABDOMEN:  Positive bowel sounds, soft, no organomegaly.  Nontender.  SKIN:  Somewhat doughy.  No significant __________ or supraclavicular  posterior cervical adenopathy.  She in a CPM on her right leg and so  that is not evaluated.  Her distal pulses are 2+/4+.   Preop hemoglobin was 13, postop was 9 this morning.  White count of 9.3.  Platelets 380,000.  Preop creatinine on the 24th was 0.89.  Laboratory  today is sodium 143, potassium 4.5, chloride 106, bicarb 21, creatinine  3.1, BUN of 48, glucose of 148.   ASSESSMENT:  1. Acute kidney injury.  She had blood pressure decrease in the      setting of significant hypertension, therefore intraoperative plan      in the setting of an angiotensin-converting enzyme inhibitor with      diabetes mellitus with fixed  neurovascular distance.  If this is      due to excessive narcotic sedation, __________ anesthesia in the      setting of significant blood loss with hemoglobin down to 13      tonight.  Now she is increasing volume and she appears to be      perfusing well.  She is acidemic and she has increased sodium      level, though her volume is okay at the current time.  We need to      rule out acute interstitial injury to the kidney from drugs and      anesthesia.  The treatment is supportive at this time with volume,      ACR, antihypertensives, and Metformin.  Rule out other causes.      Leave the Foley in and dose __________  and follow up her labs.  2. Hypertension.  Hold off meds.  3. Diabetes mellitus.  4. Status post total knee replacement with blood loss per orthopedics.  5. History of breast cancer.   PLAN:  1. IV fluids.  2. Urinalysis, urine sodium and creatinine.  3. Ultrasound.  4. Glucose control.  5. Followup labs.  6. Decrease sedation.  7. Leave her Foley in.           ______________________________  Llana Aliment. Deterding, M.D.     JLD/MEDQ  D:  04/17/2007  T:  04/17/2007  Job:  [id number]

## 2010-06-27 NOTE — Discharge Summary (Signed)
NAMEFREDDA, [name]                  ACCOUNT NO.:  [id number]   MEDICAL RECORD NO.:  [id number]          PATIENT TYPE:  INP   LOCATION:  1620                         [facility]ACILITY:  [facility]   PHYSICIAN:  Ollen Gross, M.D.    DATE OF BIRTH:  [birth year]   DATE OF ADMISSION:  04/16/2007  DATE OF DISCHARGE:  04/21/2007                               DISCHARGE SUMMARY   ADMITTING DIAGNOSES:  1. Osteoarthritis right knee.  2. Hypertension.  3. Hypercholesterolemia.  4. Hiatal hernia.  5. History of dysphagia secondary to esophageal strictures status post      dilatation.  6. Diabetes mellitus.  7. History of breast cancer status post lumpectomy.   DISCHARGE DIAGNOSES:  1. Osteoarthritis right knee status post right total knee replacement      arthroplasty.  2. Postop acute renal insufficiency/failure improved.  3. Postop blood loss anemia.  4. Status post transfusion without sequelae.   PROCEDURE:  On April 16, 2007, right total knee.  Surgeon was Dr.  Lequita Halt, assistant Alexzandrew L. Perkins, P.A.C.   CONSULTS:  Renal services, James L. Deterding, M.D.   BRIEF HISTORY:  Ms. Canny is a 77 year old female with end-stage  arthritis of the right knee with progressive worsening pain and  dysfunction who failed conservatives and now presents for total knee  arthroplasty.   LABORATORY DATA:  Preop CBC showed a white count of 5.2, red cell count  of 4.29, hemoglobin 13.3, hematocrit 38.7, platelets 307.  PT/INR preop  12.3 and 0.9 with a PTT of 23.  Chem panel slightly elevated glucose of  153.  Remaining chem panel all within normal limits.  Preop UA moderate  leukocyte esterase with 7-10 white cells, 0-2 cells, many bacteria  treated preoperatively with Cipro.  Serial CBCs on admission, hemoglobin  dropped down to 9 then 7.6 and given 2 units of blood and back up to 10,  last H and H 9.0 and 25.6.  Serial pro-times followed, Coumadin  protocol.  Last noted PT/INR 32.9 and 3.1.  Serial chem  panels were  followed.  BUN and creatinine which started out at normal level of 13  and 0.8 respectively shot up to 48 and 3.14 after 1 day thus a renal  consult was called.  She was given fluids.  Serial BMETs were followed.  The BUN came back down to 26 and last noted it had a normal level of 22.  Creatinine drifted down to 2.8 then 2.2, last noted to 1.8, improving.  Low albumin noted postop and it stayed low throughout the whole hospital  course, went up to 2.7, lowest level was 2.2.  Phosphorus was checked  which was normal.  Repeat UA on April 17, 2007, small blood, 0-2 red  cells, otherwise negative.   X-RAYS:  Chest x-ray October 17, 2006, no acute cardiopulmonary  process.   ULTRASOUND:  Renal ultrasound April 18, 2007, no evidence of  hydronephrosis.   EKG April 08, 2007, normal sinus rhythm, nonspecific ST and T-wave  abnormality, lateral T-wave abnormality has improved since last tracing  confirmed by Dr. Deloris Ping.  Nahser.   HOSPITAL COURSE:  The patient was admitted to [facility].  She  tolerated the procedure well and was later transferred to recovery room  on the orthopedic floor, started on PCA and p.o. analgesic pain control  following surgery given 24 hours postop IV antibiotics, had a tough  night with pain, seen in the morning on day 1.  Preop BUN and creatinine  were normal but her creatinine went up from 0.8 to 3.1, BUN shot up from  13 to 48.  She was not on an ACE inhibitor.  She was on metformin for  diabetes so we held that.  We also held her ACE inhibitor and started  increasing her fluids for the IV.  Her output was decent at 400 over 6  hours but we started bolus normal fluid due to the renal insufficiency  and possible failure.  Renal consult was called due to the significant  bump in her creatinine.  She was seen in consultation by Dr. Fayrene Fearing  Deterding who recommended IV fluids, UA which was normal, ultrasound to  rule out any  hydronephrosis and none was seen on the ultrasound.  By the  next day she was doing a little bit better.  Creatinine had slowly turn  around with fluid boluses.  It was down from 3.1 to 2.9.  Dressing  changed on day 2, incision looked good.  Unfortunately her hemoglobin  declined over the first 2 days down to 7.6.  She was given 2 units of  blood.  We discontinued the PCA.  We left her Foley to gravity.  She  tolerated the blood and her hemoglobin came back up to 10.  She started  getting up with therapy more over the weekend and it was felt that she  would require some skilled nursing facility rehab.  Discharge planning  got involved.  She wanted to look at [facility].  She was walking  about 50 feet over the weekend.  Renal function continued to improve on  a daily basis and on morning rounds on Monday after the weekend on April 21, 2007, her creatinine had improved.  Creatinine was back down to 1.8  getting closer to what her preop level was.  BUN was back down to a  normal level.  INR was up to 3.1, she was doing okay, stable and it was  decided the patient would be transferred to [facility].   DISCHARGE PLAN:  1. The patient will be transferred to [facility].  2. Discharge diagnoses please see above.  3. Discharge meds.  Current medications include Coumadin protocol,      please titrate the Coumadin level for target INR between 2.0 and      3.0.  She needs to be on Coumadin for 3 weeks from the date of      surgery of April 16, 2007.  Colace 100 mg p.o. b.i.d., Lipitor 10 mg      daily.  She was on an insulin protocol while in house and recommend      continued insulin sliding scale until her BUN and creatinine have      resolved completely then she can resume her metformin but hold      until her creatinine is normal.  NuIron 150 mg p.o. b.i.d.,      Protonix 40 mg daily, Percocet 5 mg one to two every 4 hours p.r.n.      pain, Tylenol 325 one or two every  4-6 hours as  needed for mild      pain, temperature or headache, Robaxin 500 mg p.o. q. 6-8 hours      p.r.n. spasm, Antivert 25 mg p.o. q. 8 hours p.r.n.  Diet low      sodium heart healthy diabetic diet, modified carb diet.   ACTIVITY:  She will be weightbearing as tolerated to the right lower  extremity, total knee protocol.  Range of motion and strength exercises,  PT and OT, gauge for ambulation, ADLs.  She may start showering however  do not submerge the incision under water.   FOLLOWUP:  She needs to follow up with Dr. Lequita Halt in 2 weeks at the  [facility] office [facility].  Please contact the  office at [phone] to help arrange appointment to transfer this patient  for followup care.   DISPOSITION:  [facility] waiting on confirmation of bed.   CONDITION ON DISCHARGE:  Improving.      Alexzandrew L. Perkins, P.A.C.      Ollen Gross, M.D.  Electronically Signed    ALP/MEDQ  D:  04/21/2007  T:  04/21/2007  Job:  [id number]   cc:   Ollen Gross, M.D.  Fax: [phone]   Loma Sender  Fax: [phone]   Llana Aliment. Deterding, M.D.  Fax: [phone]   [facility]

## 2010-06-30 NOTE — Op Note (Signed)
[place]. [facility]  Patient:    [name], [name]                         MRN: [id number] Proc. Date: 11/01/99 Adm. Date:  [id number] Attending:  Janalyn Rouse CC:         Dr. Loma Sender.   Operative Report  PREOPERATIVE DIAGNOSIS:  Carcinoma of the left breast.  POSTOPERATIVE DIAGNOSIS:  Carcinoma of the left breast.  OPERATION PERFORMED: 1. Left partial mastectomy with needle localization and specimen    mammography. 2. Left axillary sentinel lymph node biopsy.  SURGEON:  Rose Phi. Maple Hudson, M.D.  ANESTHESIA:  General.  INDICATIONS FOR PROCEDURE:  This 77 year old female had presented with an abnormal left breast mammogram.  Stereotactic core biopsy had shown an invasive carcinoma.  She was scheduled for partial mastectomy and sentinel lymph node biopsy.  DESCRIPTION OF PROCEDURE:  Prior to coming to the operating room the patient had 1 mCi of technetium sulfur colloid injected intradermally in the periareolar area.  After suitable general anesthesia was induced, the patient was placed in the supine position with the left arm extended on the arm board.  The left breast, axilla and upper shoulder were prepped and draped in the usual fashion. Scanning of the breast and the axilla with the hand held gamma probe revealed a hot spot in the axilla which I marked.  I then made a curved incision centered on the 3 oclock position where the previously placed wire had been placed.  We then widely excised the wire and surrounding tissue.  Hemostasis obtained with the cautery.  Specimen submitted to the radiology for specimen mammography which confirmed the removal of the lesion.  Then submitted to the pathologist for Touch Prep evaluation and margins.  While that was being done, a short transverse left axillary incision was made with dissection through the subcutaneous tissue to the clavipectoral fascia. The self-retaining retractor was inserted.   Using the Neoprobe as a guide, we dissected out a hot lymph node with counts that were in the 500 to 700 range. This was excised as a sentinel lymph node.  It looked like there were probably two nodes adjacent to it.  I then had one other hot spot that we identified and we got another lymph node which also had counts in the 400 to 500 range. Following removal of these, the axilla was cold.  The nodes were submitted as sentinel lymph nodes for evaluation for metastatic disease. While the pathologist was doing the reports, the incisions were closed with 3-0 Vicryl in the subcuticular, 4-0 Monocryl and Steri-Strips. Pathologist reported that the margins were free and the sentinel nodes were negative.  Dressings were applied and the patient transferred to the recovery room in satisfactory condition having tolerated the procedure well. DD:  11/01/99 TD:  11/02/99 Job: [id number]

## 2010-06-30 NOTE — Op Note (Signed)
[place]. [facility]  Patient:    [name], [name]                           MRN: [id number] Proc. Date: 05/27/00 Attending:  Rose Phi. Maple Hudson, M.D.                           Operative Report  PREOPERATIVE DIAGNOSIS:  Carcinoma of the breast.  POSTOPERATIVE DIAGNOSIS:  Carcinoma of the breast.  OPERATION PERFORMED:  Removal of Port-A-Cath.  SURGEON:  Rose Phi. Maple Hudson, M.D.  ANESTHESIA:  MAC.  DESCRIPTION OF PROCEDURE:  The patient was placed on the operating table and the upper chest wall prepped and draped in the usual fashion.  Local infiltration with 1% Xylocaine was carried out in the old Port-A-Cath incision.  The incision was then made and I exposed the implantable port.  I was able to identify the catheter and slid it out and with traction on the catheter, we divided the two  anchoring sutures.  The catheter then slipped out.  There was no bleeding.  Incision closed with running 5-0 nylon. Dressing applied.  The patient was transferred to the recovery room in satisfactory condition having tolerated the procedure well. DD:  05/27/00 TD:  05/27/00 Job: [id number]

## 2010-06-30 NOTE — Discharge Summary (Signed)
[place]. [facility]  Patient:    [name], [name] Visit Number: [id number] MRN: [id number]          Service Type: SUR Location: 5000 [id number] Attending Physician:  Carolan Shiver Ii Dictated by:   Jamelle Rushing, P.A.-C Admit Date:  07/30/2001 Disc. Date: 08/07/01                             Discharge Summary  ADMISSION DIAGNOSES: 1. End-stage osteoarthritis, right knee. 2. Borderline diabetes, diet-controlled. 3. Hypertension. 4. History of hiatal hernia. 5. History of breast cancer with left-sided mastectomy.  DISCHARGE DIAGNOSES: 1. Right total knee arthroplasty. 2. Symptomatic postoperative blood loss anemia. 3. Elevated blood glucose, status post surgery with history of borderline    diabetes. 4. Hypertension. 5. History of hiatal hernia. 6. History of breast cancer with mastectomy.  HISTORY OF PRESENT ILLNESS:  The patient is a 77 year old white female with a history of left knee problems over the last several years.  The patient started noticing a significant worsening of her left knee pain over the last 3-4 months.  The pain is primarily noted along the medial joint line and described as a sharp shooting pain with radiation up into the thigh.  The pain worsens with any long-term standing or ambulating.  She does have significant night pain.  She does have grinding.  The patient denies any specific injury. She is currently not using any assistive device.  X-ray reveals bone against bone in the medial compartment of the left knee.  DRUG ALLERGIES:  No known drug allergies.  CURRENT MEDICATIONS ON ADMISSION: 1. Tamoxifen 20 mg p.o. q.d. 2. Lotensin 10 mg p.o. qd 3. Lipitor 5 mg pop q.d. 4. Vicodin p.r.n.  SURGICAL PROCEDURE:  On July 30, 2001, the patient was taken to the OR by Dr. Erasmo Leventhal, assisted by Dr. Vear Clock and Maple Hudson, P.A.S. Under general anesthesia, the patient underwent a left LCS total knee replacement,  fully cemented.  Operative time was 1 hour 13 minutes. Tourniquet time was 47 minutes.  One Hemovac drain was left in place.  The patient tolerated the procedure well.  The patient did receive postoperative femoral nerve block for assistance in postop pain management.  The patient was transferred to the recovery room and then to the orthopedic floor in good condition.  CONSULTS:  The following routine consults were requested:  Physical therapy, occupational therapy, case management, rehab.  A medical consult by Dr. Baltazar Najjar was requested due to the fact that the patients primary care physician did not have hospital privileges.  It was to evaluate and treat patients elevated blood glucose levels.  HOSPITAL COURSE:  July 30, 2001, the patient was admitted to [facility] under the care of John L. Dorothyann Gibbs, M.D.  The patient was taken to the OR where a left total knee arthroplasty was performed.  No complications were incurred.  One Hemovac drain was left in place, and the patient was transferred to the recovery room and then to the orthopedic floor in good condition.  The patient was started on Arixtra 2.5 mg subcu q.d. for a total of 10 days for routine DVT prophylaxis.  The patient then incurred a total of eight days on the orthopedic floor in which the patient did incur the following events. 1. Elevated blood glucose.  The patients blood glucose was elevated postop to    the point of  being as high as 172, 191 in the PACU postsurgery.  A medical    consult was requested due to the fact that this patient with borderline    diabetic, and she reported to be just controlling it with diet in the past.    Dr. Leanord Hawking did evaluate her, and she was placed on an insulin sliding-scale    for her elevated blood glucose.  The patient was placed on Glucophage    500 mg p.o. b.i.d. once her blood sugars became a little bit more stable,    and she will be discharged on this medication  with recommendations to    follow up postdischarge with her primary care physician for further    evaluation. 2. Postoperative blood loss anemia.  The patients H&H gradually drifted down    over several days and, on postop day #5, she did drop to 7.9/23.5.  The    patient was complaining of being weak and tired, so she was typed and    crossed and transfused two units of packed red blood cells without any    difficulties.  On date of discharge, her H&H did improve to the point of    elevating to 11.9/35.5. 3. Slow progress with physical therapy.  The patient attempted to work    diligently with physical therapy but due to the fact of her age, multiple    medical issues involved, she did improve steadily to the point were she had    the CPM increased to 70 degrees.  She did work with physical therapy where    she was ambulating with some close supervision approximately 80-100 feet    but, due to the fact that she does live alone, and she is unable to take    care of herself at this particular point, arrangements were made for her    postdischarge care.  No beds were available on the subacute care unit, and    she was too highly progressed for that unit, so arrangements were made for    a skilled nursing facility.  Otherwise, from the general orthopedic standpoint, the patients pain was well controlled on p.o. pain medications.  She did have her appetite improved on a daily basis.  Her leg remained neurological, motor, vascularly intact.  Her wounds remained benign for any signs of infection, and she had no other significant complaints.  On postop day #8, arrangements were made for the patient to be discharged to [facility], and it was felt that the patient was both medically and orthopedically stable for transfer, and she was, in fact, transferred in good condition.  LABORATORY DATA: CBC on August 07, 2001:  WBC 5.5, hemoglobin 11.9, hematocrit 35.5,  platelets 298. August 01, 2001, routine chemistries:  Sodium 138, potassium 3.6, glucose 132, BUN 6 down from a high of 14, creatinine 0.8.  Finger glucose stick at 0600 on  the 26th was 137 EKG on July 25, 2001, was normal sinus rhythm.  ST and T wave abnormalities, consider anterolateral ischemia at a rate of 76 beats per minute.  parade axes 46 minus 12 and 132.  DISCHARGE MEDICATIONS FROM ORTHOPEDIC FLOOR:  1. Colace 100 mg p.o. b.i.d.  2. Trinsicon 1 tab p.o. t.i.d. with meals.  3. Zocor 10 mg p.o. q.d.  4. Tamoxifen 20 mg p.o. q.d.  5. Lotensin 10 m g p.o. q.d.  6. Arixtra 2.5 mg subcu q.24h. Today is day #8 of a total of 10 days.  7. Protonix 40 mg p.o. q.d.  8. Glucophage 500 mg p.o. b.i.d.  9. Laxative of enema of choice p.r.n. 10. Reglan 10 mg p.o. q.8h. p.r.n. 11. Percocet 1-2 tabs q.4-6h. p.r.n. pain. 12. Tylenol 650 mg p.o. q.4h. p.r.n. 13. Restoril 30 mg p.o. q.h.s. p.r.n. 14. Chloraseptic oral spray p.r.n.  DISCHARGE INSTRUCTIONS: Medications to be continued:  1. Colace 100 mg p.o. b.i.d.  2. Trinsicon 1 tab p.o. t.i.d. for a total of 15 days.  3. Zocor 10 mg p.o. q.d.  4. Tamoxifen 20 mg p.o. q.d.  5. Lotensin 10 mg p.o. q.d.  6. Arixtra 2.5 mg subcu x 2 more days and then 1 aspirin 325 mg p.o. q.d. x     30 days.  7. Protonix 40 mg p.o. q.d.  8. Glucophage 500 mg p.o. b.i.d. until patient has follow-up appointment with     her primary care physician for glucose level evaluation.  9. Laxative or enema of choice p.r.n. 10. Percocet 1-2 tabs p.o. q.4-6h. p.r.n. 11. Tylenol 650 mg p.o. q.4h. p.r.n. 12. Restoril 30 mg p.o. q.h.s. p.r.n.  ACTIVITY:  The patient may be weightbearing as tolerated, out of bed daily with physical therapy twice a day.  The patient may shower.  DIET:  The patient should be on a 2000 calorie ADA diet.  WOUND CARE:  The patient should have wound checked daily for any signs of infection.  Wound staples are to be removed one week from  the date of discharge from the hospital.  Please apply Steri-Strips.  SPECIAL INSTRUCTIONS:  The patient should have the use of a CPM for at least eight hours a day, initially start at 70 degrees flexion and increase by 5 degrees a day.  Ultimate goal is 115 degrees.  FOLLOW-UP:  The patient should have arrangements made for a follow-up appointment with Dr. Priscille Kluver two weeks from the date of discharge from [facility].  Please call [phone].  The patient should also have a follow-up appointment with her primary care physician for evaluation of her blood glucose levels within one month from date of discharge from [facility].  CONDITION ON DISCHARGE FROM [facility]:  Listed as improved and good. Dictated by:   Jamelle Rushing, P.A.-C Attending Physician:  Carolan Shiver Ii DD:  08/07/01 TD:  08/07/01 Job: [id number]

## 2010-06-30 NOTE — Op Note (Signed)
[place]. [facility]  Patient:    [name], [name]                         MRN: [id number] Proc. Date: 12/04/99 Adm. Date:  [id number] Disc. Date: [id number] Attending:  Janalyn Rouse                           Operative Report  PREOPERATIVE DIAGNOSIS:  Carcinoma of the left breast.  POSTOPERATIVE DIAGNOSIS:  Carcinoma of the left breast.  OPERATION PERFORMED:  Insertion of Port-A-Cath.  SURGEON:  Rose Phi. Maple Hudson, M.D.  ANESTHESIA:  MAC.  DESCRIPTION OF PROCEDURE:  The patient was placed on the operating table with a roll between the shoulder blades.  Her right upper chest and neck were prepped and draped in the usual sterile fashion.  Under local anesthesia, a right subclavian puncture was carried out without difficulty and the guide wire passed.  Proper positioning of the wire was confirmed by fluoroscopy.  Under local, a transverse incision was made on the anterior chest wall and a pocket developed for the implantable port. We then tunneled between the subclavian area and the newly developed pocket and then passed the catheter through.  The port had been flushed with heparinized saline it was then anchored in place in the previously prepared pocket with two 2-0 Prolene sutures.  The tip of the catheter was cut ____________ fourth interspace.  The dilator and peel-away sheath were passed over the wire and then the wire removed and then the dilator removed and catheter passed through the peel-away sheath.  The sheath was then removed and the proper position of the catheter tip was again confirmed by fluoroscopy.  The chest wall incision was closed with 3-0 Vicryl and a subcuticular 4-0 Monocryl and Steri-Strips used to close all the incisions.  The system was then accessed with a right angle Demetrios Isaacs point needle and the system flushed with heparin and she was to get her next chemotherapy treatment tomorrow.  Dressings were applied and the patient  transferred to the recovery room in satisfactory condition having tolerated the procedure well. DD:  12/04/99 TD:  12/04/99 Job: [id number]

## 2010-06-30 NOTE — Procedures (Signed)
[facility]  Patient:    [name], [name] Visit Number: [id number] MRN: [id number]          Service Type: END Location: ENDO Attending Physician:  Sabino Gasser Dictated by:   Sabino Gasser, M.D. Proc. Date: 07/16/01 Admit Date:  07/16/2001   CC:         Salome Spotted, M.D.  Samul Dada, M.D.  John L. Dorothyann Gibbs, M.D.   Procedure Report  PROCEDURE:  Upper endoscopy.  INDICATIONS:  Gastroesophageal reflux disease.  ANESTHESIA:  Demerol 60, Versed 7 mg.  DESCRIPTION OF PROCEDURE:  With patient mildly sedated in the left lateral decubitus position, the Olympus videoscopic endoscope was inserted into the mouth and passed under direct vision through the esophagus which appeared normal.  We entered into the stomach.  There was a slight tightness of the lower esophageal sphincter, but we entered into the stomach.  Fundus, body, antrum, duodenal bulb, and second portion of duodenum all were visualized and appeared normal.  From this point, the endoscope was slowly withdrawn, taking circumferential views of the entire duodenal mucosa until the endoscope then pulled back into the stomach and placed in retroflexion to view the stomach from below, and a hiatal hernia was seen and photographed.  From below, it was noted that there was a stricture.  It had been dilated by the passage of the endoscope, so there was a small amount of bleeding.  The endoscope was then straightened and withdrawn, taking circumferential views of the remaining gastric and esophageal mucosa.  The patients vital signs and pulse oximeter remained stable.  The patient tolerated the procedure well without apparent complications.  FINDINGS:  Changes of stricture, presumably secondary to reflux, dilated with the endoscope, otherwise unremarkable examination, and a hiatal hernia was seen.  PLAN:  Proceed to colonoscopy, Dictated by:   Sabino Gasser, M.D. Attending Physician:  Sabino Gasser DD:  07/16/01 TD:  07/17/01 Job: [id number]

## 2010-06-30 NOTE — Procedures (Signed)
[facility]  Patient:    [name], [name] Visit Number: [id number] MRN: [id number]          Service Type: END Location: ENDO Attending Physician:  Sabino Gasser Dictated by:   Sabino Gasser, M.D. Proc. Date: 07/16/01 Admit Date:  07/16/2001   CC:         Salome Spotted, M.D.  Samul Dada, M.D.  John L. Dorothyann Gibbs, M.D.   Procedure Report  PROCEDURE:  Colonoscopy.  INDICATIONS:  Colon cancer screening.  ANESTHESIA:  Demerol 40, Versed 3 mg.  DESCRIPTION OF PROCEDURE:  With the patient mildly sedated in the left lateral decubitus position, the Olympus videoscopic colonoscope was inserted into the rectum and passed under direct vision to the cecum, identified by the ileocecal valve and appendiceal orifice, both of which were photographed. From this point, the colonoscope was slowly withdrawn, taking circumferential views of the entire colonic mucosa until we reached the rectum which appeared normal on direct and showed hemorrhoids on retroflexed view.  The endoscope was straightened and withdrawn.  The patients vital signs and pulse oximeter remained stable.  The patient tolerated the procedure well without apparent complications.  FINDINGS: 1. Occasional diverticulum of sigmoid colon. 2. Internal hemorrhoids. 3. Otherwise unremarkable examination.  PLAN:  Follow up with me as needed. Dictated by:   Sabino Gasser, M.D. Attending Physician:  Sabino Gasser DD:  07/16/01 TD:  07/17/01 Job: [id number]

## 2010-06-30 NOTE — Op Note (Signed)
[place]. [facility]  Patient:    [name], [name] Visit Number: [id number] MRN: [id number]          Service Type: SUR Location: 5000 [id number] 01 Attending Physician:  Carolan Shiver Ii Dictated by:   Carlisle Beers. Dorothyann Gibbs, M.D. Admit Date:  07/30/2001                             Operative Report  PREOPERATIVE DIAGNOSIS:  Osteoarthritis, left knee.  POSTOPERATIVE DIAGNOSIS:  Osteoarthritis, left knee.  PROCEDURE:  Left LCS total knee replacement, cemented.  SURGEON:  John L. Dorothyann Gibbs, M.D.  ASSISTANTSJerolyn Shin. Tresa Res, M.D., and Lorin Picket, P.A.-S.  ANESTHESIA:  General.  OPERATIVE TIME:  1 hour 13 minutes.  PATHOLOGY:  The patient is a 77 year old with bone against bone medially and extensive wear elsewhere, with chronic pain unremitting with conservative measures.  DESCRIPTION OF PROCEDURE:  Under general anesthesia, the left leg is prepared with Duraprep and draped as a sterile field.  A midline incision is made, carried medial to parapatellar deep.  The patellar is everted, the femur is sized at a standard.  The first tibial guide is used for proximal tibial resection.  PCL is not preserved.  First femoral guide is used for an intercondylar drill hole.  The second femoral guide is made for anterior and posterior condylar resection with a flexion gap of 17.5 mm.  The intramedullary guide is then used and the distal femoral cuts made with a 17.5 extension gap.  Recessing guide is then used.  Lamina spreaders are inserted. Remnants of the cruciates and menisci are removed.  Osteophytes off the back of the femoral condyle are resected.  Attention is then turned to the tibia, which is sized at a 3, center peg hole with keel is placed.  Trial reduction of a 3 tibia, a 17.5 bearing, and standard femur reveals excellent fit and alignment but with some slight laxity.  A 20 bearing is inserted and the laxity is gone and excellent fit and alignment are  obtained.  The patella is osteotomized, three peg holes placed.  Permanent components are then obtained and bony surfaces are prepared with pulse irrigation and antibiotic solution. All components are then cemented in place after cement hardening, excess is removed.  Tourniquet is let down at 47 minutes.  Multiple small vessels are cauterized while excess cement is removed.  A Hemovac drain is then inserted. The wound is then closed in layers with #1 Tycron, 0 and 2-0 Vicryl, and skin clips.  The patient tolerated the procedure well and returned to recovery in good condition. Dictated by:   Carlisle Beers. Dorothyann Gibbs, M.D. Attending Physician:  Carolan Shiver Ii DD:  07/30/01 TD:  07/31/01 Job: [id number]

## 2010-06-30 NOTE — H&P (Signed)
[facility]  Patient:    [name], [name] Visit Number: [id number] MRN: [id number]          Service Type: END Location: ENDO Attending Physician:  Sabino Gasser Dictated by:   Jamelle Rushing, P.A. Admit Date:  07/16/2001 Discharge Date: 07/16/2001                           History and Physical  DATE OF BIRTH:  February 26, 1929  CHIEF COMPLAINT:  Left knee pain.  HISTORY OF PRESENT ILLNESS:  The patient is a 77 year old white female with a history of problems with her left knee for several years.  The patient has started to notice significantly worsening of her left knee pain over the last three to four months.  The pain has been primarily noted along the medial joint line.  It is described as a sharp, shooting pain with radiation up into the thigh.  The pain worsens with any long-term standing or walking.  She does have night pain.  She does have grinding in the knee.  The patient denies any specific injury.  She is currently not using any assistive device.  X-rays reveal bone against bone in the medial compartment, left knee.  DRUG ALLERGIES:  The patient denies any drug allergies.  CURRENT MEDICATIONS: 1. Tamoxifen 20 mg p.o. q.d. 2. Lotensin 10 mg p.o. q.d. 3. Lipitor 5 mg p.o. q.d. 4. Vicodin p.r.n.  PAST MEDICAL HISTORY: 1. Borderline diabetes currently being evaluated, otherwise controlled by    diet. 2. Hypertension. 3. Hiatal hernia. 4. History of breast cancer with mastectomy on the left.  PAST SURGICAL HISTORY: 1. Gallbladder removal in 1978. 2. Left breast mastectomy in 2001.  The patient denies any complications with the above-mentioned procedures.  SOCIAL HISTORY:  The patient is a 77 year old white female.  Denies any history f smoking or alcohol use.  The patient is widowed.  She does have two grown children.  She lives in a [place] house.  She is currently retired.  FAMILY PHYSICIAN:  Dr. Loma Sender at  [phone].  FAMILY MEDICAL HISTORY:  Mother is deceased from complications of hip fracture.  Father is deceased of unknown causes but did have diabetes.  The patient has two brothers deceased - one from an MI, the second from unknown causes.  Three sisters deceased - one from ovarian cancer, one from complications of Alzheimers, the third from unknown causes.  The patient has two brothers alive - one of them with a significant cardiac history.  One sister alive in good health.  REVIEW OF SYSTEMS:  Positive for upper partial plate.  She does use glasses for reading.  Otherwise, the review of systems categories are negative.  PHYSICAL EXAMINATION:  VITAL SIGNS:  Height 5 feet 3 inches, weight 180 pounds, pulse 88, respirations 12, temperature 98.6, blood pressure 148/70.  GENERAL:  This is a healthy-appearing, well-developed white female, short in stature, slightly centrally obese.  She ambulates without any significant limp.  She is able to get on and off the exam table without any difficulty.  HEENT:  Head was normocephalic, atraumatic, nontender over maxillary or frontal sinuses.  Pupils are equal, round and reactive, accommodating to light.  Extraocular movements intact.  Sclerae are not icteric.  Conjunctivae are pink and moist.  External ears were without deformities, canals patent, TMs pearly gray and intact.  Gross hearing is intact.  Nasal septum was midline.  Mucous  membranes pink and moist, no polyps noted.  Oral buccal mucosa was pink and moist without lesions.  Upper partial plate was in place; the rest of the dentition was in fair repair.  Uvula was midline.  The patient was able to swallow without any difficulty.  NECK:  Supple.  No palpable lymphadenopathy.  Thyroid gland was nontender. The patient had excellent range of motion of her cervical spine without any difficulty or tenderness and no tenderness along the entire spinous process region with percussion.  CHEST:   Lung sounds were clear and equal bilaterally.  HEART:  Regular rate and rhythm, S1 and S2 was auscultated.  ABDOMEN:  Round, slightly obese.  No tenderness to deep palpation.  Unable to palpate any hepatosplenomegaly.  CVA was nontender to percussion.  Bowel sounds were normal active throughout.  EXTREMITIES:  Upper extremities were symmetrically sized and shaped.  She had good range of motion of her shoulders, elbows and wrists without any difficulty.  Motor strength was 5/5.  Right and left hip had full range of motion without any mechanical symptoms or difficulty.  Left knee had significant medial joint line tenderness.  No signs of erythema or ecchymosis or palpable effusion.  She has about 5 degrees of valgus-varus laxity.  Range of motion was 10 to 105 degrees.  She had no anterior or posterior drawer.  Bilateral calves were nontender.  Ankles were symmetric with good dorsi and plantar flexion.  PERIPHERAL VASCULATURE:  Carotid pulses were 2+, no bruits; radial pulses 2+; posterior tibial pulses and dorsalis pedis pulses were 1+.  The patient had no lower extremity edema or venous stasis changes.  NEUROLOGIC:  The patient was conscious, alert, and appropriate, held an easy conversation with the examiner.  Cranial nerves II-XII were grossly intact. Deep tendon reflexes of the upper and lower extremities were symmetrical right to left.  BREAST, RECTAL, GENITOURINARY:  Deferred at this time.  IMPRESSION: 1. End-stage osteoarthritis, right knee. 2. Borderline diabetes, diet controlled. 3. Hypertension. 4. History of hiatal hernia. 5. History of breast cancer with left-sided mastectomy.  PLAN:  The patient will be admitted to [facility] under the care of Dr. Jonny Ruiz L. Rendall.  The patient will undergo all routine labs and tests prior to having a left total knee arthroplasty on July 30, 2001. Dictated by:   Jamelle Rushing, P.A. Attending Physician:  Sabino Gasser DD:   07/22/01 TD:  07/24/01 Job: [id number]

## 2010-06-30 NOTE — Consult Note (Signed)
. [facility]  Patient:    [name], [name] Visit Number: [id number] MRN: [id number]          Service Type: SUR Location: [id number] Attending Physician:  Carolan Shiver Ii Dictated by:   Terald Sleeper, M.D. Admit Date:  07/30/2001                            Consultation Report  REASON FOR CONSULTATION:  A 77 year old female from [place] I have been asked to see postoperatively by Dr. Priscille Kluver.  HISTORY OF PRESENT ILLNESS:  This patient was admitted and had a left total knee this morning for progressive symptoms related to osteoarthritis. She tells me she had increasing pain with ambulation over the last several months. She has no prior history of heart or lung disease. She is a nonsmoker. She had absolutely no cardiopulmonary limitations or problems that I can elicit.  PAST MEDICAL HISTORY: 1. Hypertension on Lotensin. 2. A partial mastectomy in the left breast on tamoxifen followed by Dr.    Arline Asp. 3. A known hiatal hernia. 4. Diabetes diet controlled. 5. Recent endoscopy and colonoscopy by Dr. Virginia Rochester for reasons that are unclear.    She was found to have a stricture of her esophagus secondary to presumed    gastroesophageal reflux disease.  MEDICATIONS PRIOR TO ADMISSION: 1. Lipitor 10 mg q.h.s. 2. Tamoxifen 20 mg q.d. 3. Lotensin 10 mg q.d. 4. Vicodin p.r.n.  SOCIAL HISTORY:  The patient lives an independent life in [place].  REVIEW OF SYSTEMS:  RESPIRATORY:  No cough, no sputum. CARDIOVASCULAR:  No chest pain. GI:  No nausea or no vomiting.  PHYSICAL EXAMINATION:  VITAL SIGNS:  Blood pressure 134/65, pulse 102, respirations 15, CBG is currently 191.  GENERAL:  The patient is in no distress although she did vomit once while I was in the room.  HEENT:  Normal.  RESPIRATORY:  Clear.  CARDIOVASCULAR:  Heart sounds are normal. There is no murmurs, no carotid bruits.  ABDOMEN:  Distended but nontender. There  are no masses.  EXTREMITIES:  Right leg has good pulses.  IMPRESSION: 1. She is stable postop. She will need deep vein thrombosis prophylaxis. 2. Hypertension. This seems stable at the moment. Resume her oral medications    when she is able. 3. She had a fairly extensive recent gastrointestinal workup earlier in June    for what she says are routine reasons although I wonder why she had and    endoscopy. I did note that they found a stricture. 4. Borderline diabetes currently no on any treatment although her CBG    currently is 191.  PLAN: 1. Will leave an insulin sliding scale. 2. I think we will need to address DVT prophylaxis and I will start this this    evening. 3. She has no reason to suspect active coronary artery disease although her    EKG done preop did show ST-T abnormalities in the anterior leads. These are    very nonspecific and not associated with any worrisome history at this    time.  I will follow her postoperatively. Many thanks for asking me to see her. Dictated by:   Terald Sleeper, M.D. Attending Physician:  Carolan Shiver Ii DD:  07/30/01 TD:  07/31/01 Job: [id number]

## 2010-10-10 ENCOUNTER — Other Ambulatory Visit (HOSPITAL_COMMUNITY): Payer: Self-pay | Admitting: Oncology

## 2010-10-10 DIAGNOSIS — Z1231 Encounter for screening mammogram for malignant neoplasm of breast: Secondary | ICD-10-CM

## 2010-11-03 LAB — COMPREHENSIVE METABOLIC PANEL
BUN: 13
CO2: 29
Calcium: 10
Chloride: 103
Creatinine, Ser: 0.89
GFR calc non Af Amer: >60
Total Bilirubin: 0.8

## 2010-11-03 LAB — CBC
HCT: 38.7
MCHC: 34.3
MCV: 90.3
RBC: 4.29
WBC: 5.2

## 2010-11-03 LAB — PROTIME-INR
INR: 0.9
Prothrombin Time: 12.3

## 2010-11-03 LAB — URINALYSIS, ROUTINE W REFLEX MICROSCOPIC
Bilirubin Urine: NEGATIVE
Ketones, ur: NEGATIVE
Nitrite: NEGATIVE
Protein, ur: NEGATIVE
Urobilinogen, UA: 0.2

## 2010-11-03 LAB — APTT: aPTT: 23 — ABNORMAL LOW

## 2010-11-03 LAB — URINE MICROSCOPIC-ADD ON

## 2010-11-06 LAB — COMPREHENSIVE METABOLIC PANEL
Albumin: 2.5 — ABNORMAL LOW
Alkaline Phosphatase: 115
BUN: 30 — ABNORMAL HIGH
Chloride: 112
Creatinine, Ser: 2.27 — ABNORMAL HIGH
Glucose, Bld: 131 — ABNORMAL HIGH
Potassium: 3.7
Total Bilirubin: 0.9
Total Protein: 5 — ABNORMAL LOW

## 2010-11-06 LAB — CBC
HCT: 25.8 — ABNORMAL LOW
HCT: 28.5 — ABNORMAL LOW
HCT: 28.9 — ABNORMAL LOW
Hemoglobin: 10 — ABNORMAL LOW
Hemoglobin: 10.1 — ABNORMAL LOW
Hemoglobin: 9 — ABNORMAL LOW
MCHC: 34.7
MCHC: 35.1
MCHC: 35.2
MCV: 89.1
MCV: 91
Platelets: 218
Platelets: 259
Platelets: 280
RBC: 3.24 — ABNORMAL LOW
RDW: 13.1
RDW: 13.7
RDW: 13.9
WBC: 7.7
WBC: 9.3

## 2010-11-06 LAB — BASIC METABOLIC PANEL
BUN: 48 — ABNORMAL HIGH
CO2: 24
Calcium: 7.7 — ABNORMAL LOW
Creatinine, Ser: 2.89 — ABNORMAL HIGH
Creatinine, Ser: 3.14 — ABNORMAL HIGH
GFR calc Af Amer: 17 — ABNORMAL LOW
GFR calc non Af Amer: 14 — ABNORMAL LOW
Glucose, Bld: 145 — ABNORMAL HIGH
Potassium: 4.5

## 2010-11-06 LAB — RENAL FUNCTION PANEL
Albumin: 2.2 — ABNORMAL LOW
BUN: 22
BUN: 26 — ABNORMAL HIGH
CO2: 23
CO2: 24
Calcium: 7.8 — ABNORMAL LOW
Calcium: 7.9 — ABNORMAL LOW
Calcium: 7.9 — ABNORMAL LOW
Chloride: 112
Chloride: 116 — ABNORMAL HIGH
Creatinine, Ser: 1.82 — ABNORMAL HIGH
Creatinine, Ser: 2.03 — ABNORMAL HIGH
GFR calc Af Amer: 29 — ABNORMAL LOW
GFR calc Af Amer: 33 — ABNORMAL LOW
GFR calc non Af Amer: 24 — ABNORMAL LOW
GFR calc non Af Amer: 27 — ABNORMAL LOW
Glucose, Bld: 114 — ABNORMAL HIGH
Glucose, Bld: 143 — ABNORMAL HIGH
Phosphorus: 2.8
Sodium: 142

## 2010-11-06 LAB — DIFFERENTIAL
Basophils Absolute: 0
Basophils Relative: 0
Monocytes Absolute: 0.7
Neutro Abs: 6
Neutrophils Relative %: 80 — ABNORMAL HIGH

## 2010-11-06 LAB — URINALYSIS, ROUTINE W REFLEX MICROSCOPIC
Ketones, ur: NEGATIVE
Leukocytes, UA: NEGATIVE
Nitrite: NEGATIVE
Protein, ur: NEGATIVE

## 2010-11-06 LAB — TYPE AND SCREEN
ABO/RH(D): O POS
Antibody Screen: NEGATIVE

## 2010-11-06 LAB — HEPATIC FUNCTION PANEL
ALT: 10
AST: 26
Albumin: 2.6 — ABNORMAL LOW
Bilirubin, Direct: 0.1

## 2010-11-06 LAB — PROTIME-INR
INR: 1.5
INR: 1.7 — ABNORMAL HIGH
INR: 2 — ABNORMAL HIGH
Prothrombin Time: 13.7
Prothrombin Time: 20.2 — ABNORMAL HIGH
Prothrombin Time: 23.5 — ABNORMAL HIGH

## 2010-11-06 LAB — IRON AND TIBC
Saturation Ratios: 11 — ABNORMAL LOW
TIBC: 229 — ABNORMAL LOW

## 2010-11-06 LAB — CREATININE, URINE, RANDOM: Creatinine, Urine: 65.2

## 2010-11-06 LAB — PHOSPHORUS: Phosphorus: 2.8

## 2010-11-17 LAB — URINALYSIS, ROUTINE W REFLEX MICROSCOPIC
Bilirubin Urine: NEGATIVE
Glucose, UA: NEGATIVE mg/dL
Nitrite: POSITIVE — AB
Specific Gravity, Urine: 1.018 (ref 1.005–1.030)
pH: 5.5 (ref 5.0–8.0)

## 2010-11-17 LAB — URINE MICROSCOPIC-ADD ON

## 2010-11-17 LAB — POCT I-STAT, CHEM 8
Calcium, Ion: 1.21 mmol/L (ref 1.12–1.32)
Glucose, Bld: 108 mg/dL — ABNORMAL HIGH (ref 70–99)
HCT: 41 % (ref 36.0–46.0)
Hemoglobin: 13.9 g/dL (ref 12.0–15.0)
Potassium: 4.2 mEq/L (ref 3.5–5.1)

## 2010-11-20 ENCOUNTER — Ambulatory Visit
Admission: RE | Admit: 2010-11-20 | Discharge: 2010-11-20 | Disposition: A | Discharge status: Home / Self Care | Payer: BC Managed Care – PPO | Source: Ambulatory Visit | Attending: Oncology | Admitting: Oncology

## 2010-11-20 DIAGNOSIS — Z1231 Encounter for screening mammogram for malignant neoplasm of breast: Secondary | ICD-10-CM

## 2011-02-01 IMAGING — CT CT ABDOMEN W/ CM
3 of 5 series · 15 of 32 positions shown, 19 images · IV contrast (agent unspecified)
Comparison: 04/18/2007

CT ABDOMEN

CLINICAL DATA: Left-sided abdominal pain.  Nausea.

CT ABDOMEN AND PELVIS WITH CONTRAST
TECHNIQUE: Multidetector CT imaging of the abdomen and pelvis was
performed using the standard protocol following bolus
administration of intravenous contrast.
Contrast: 100 ml [id number]

[Series 2: routine abdomen · axial · 0.85mm/px · z∈[-318,-88]mm · 3 of 93 slices shown, 7 images]
[im 24/93  soft-tissue]
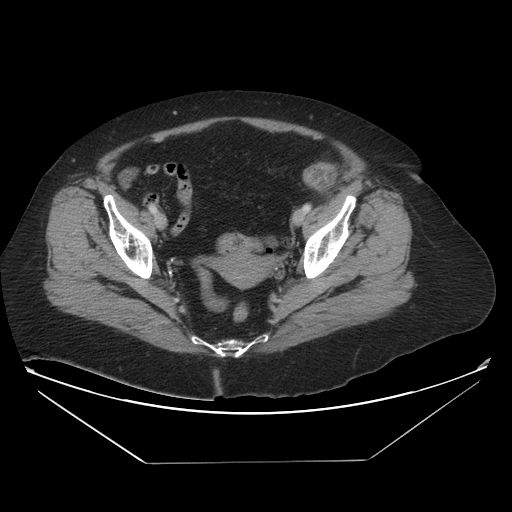
[im 24/93  lung]
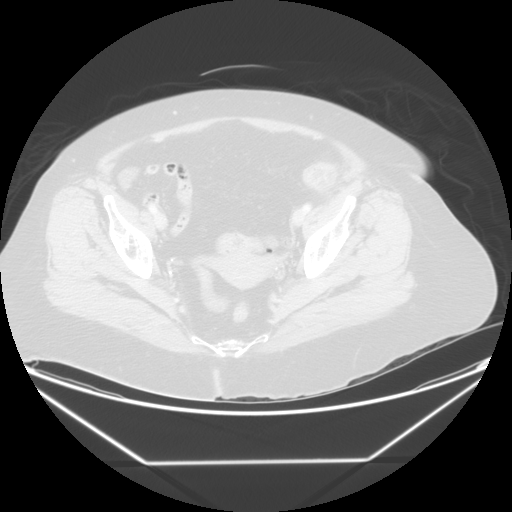
[im 24/93  bone]
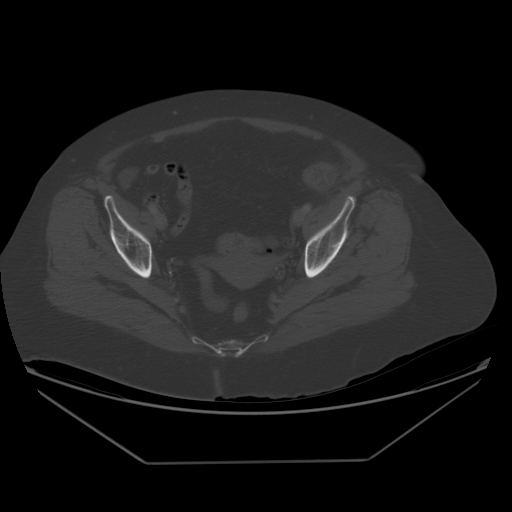
[im 47/93  soft-tissue]
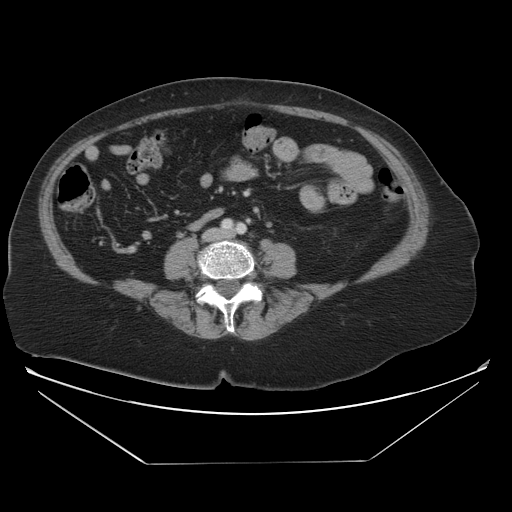
[im 47/93  lung]
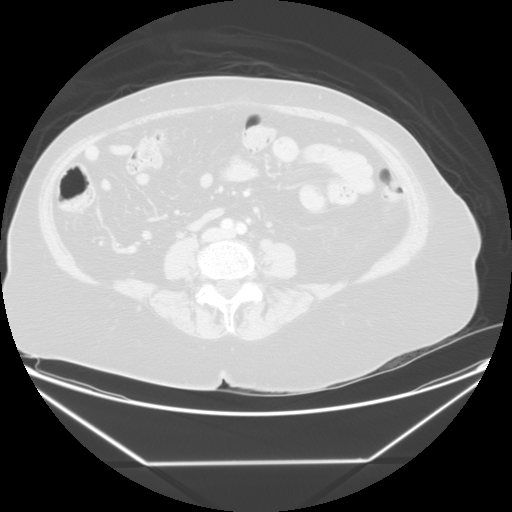
[im 70/93  soft-tissue]
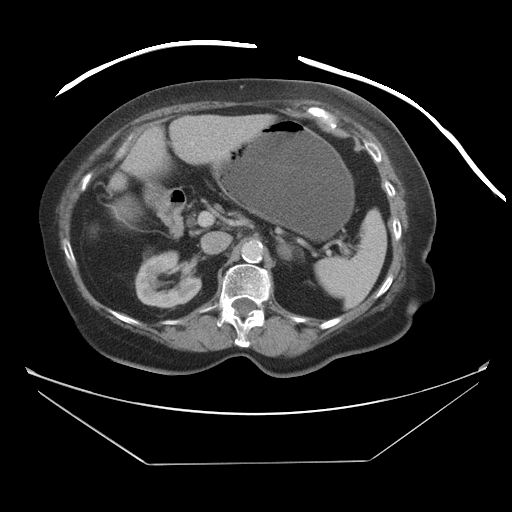
[im 70/93  lung]
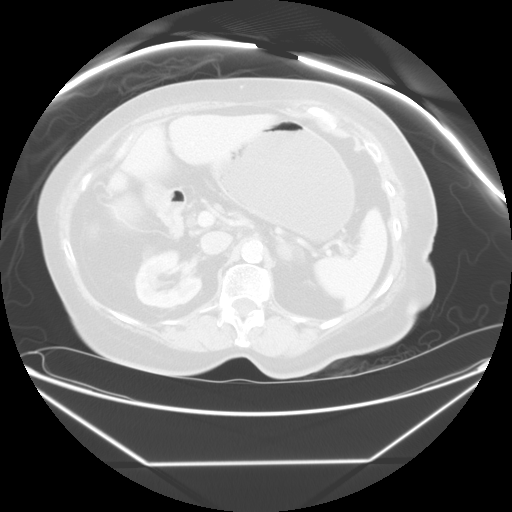

[Series 400: reformatted · sagittal · 0.93mm/px · 8 of 208 slices shown (1 of 2)]
[im 19/208  soft-tissue]
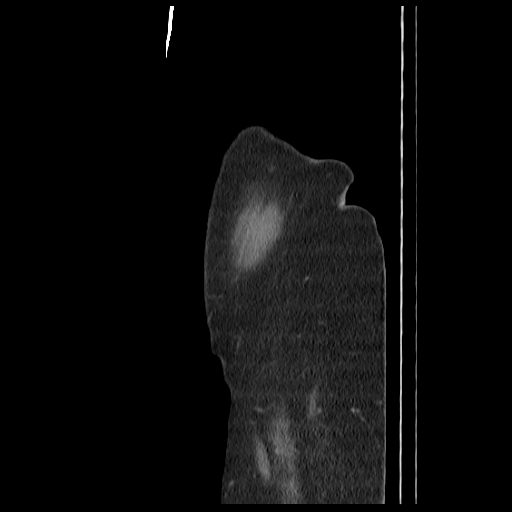
[im 38/208  soft-tissue]
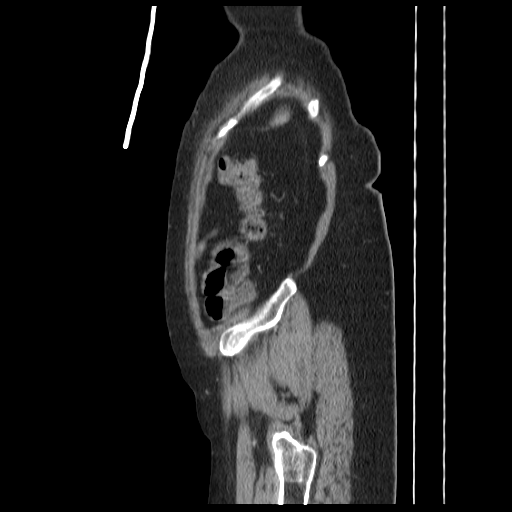
[im 76/208  soft-tissue]
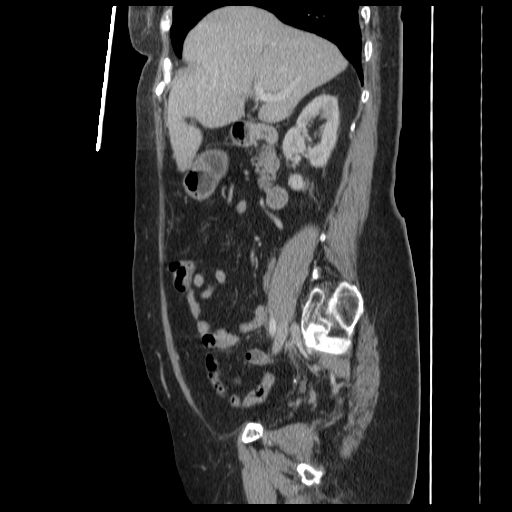
[im 95/208  soft-tissue]
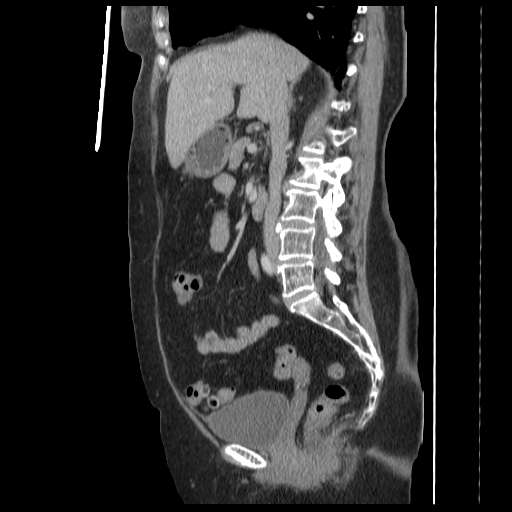
[im 113/208  soft-tissue]
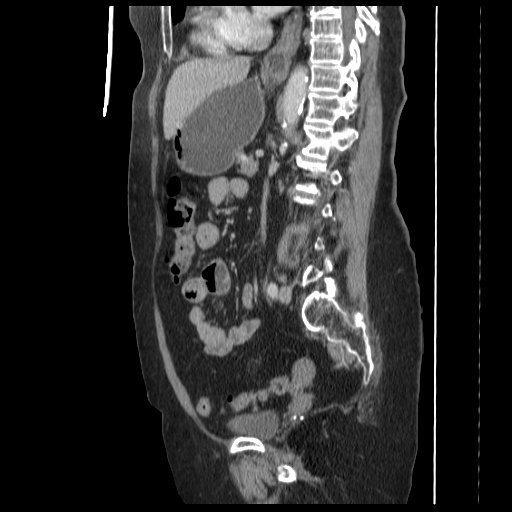
[im 132/208  soft-tissue]
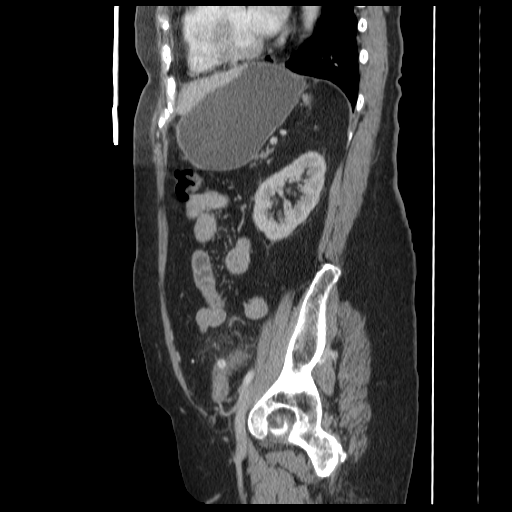
[im 170/208  soft-tissue]
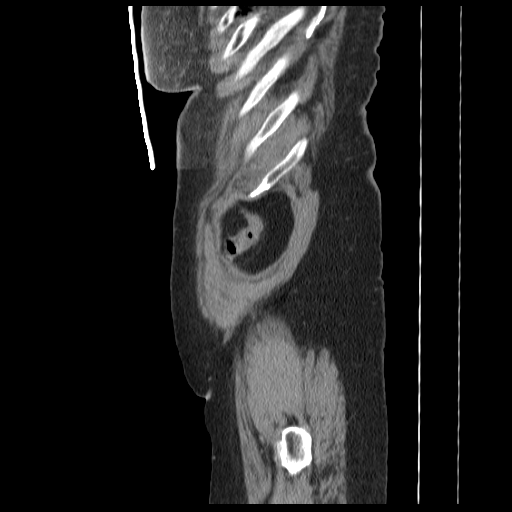
[im 189/208  soft-tissue]
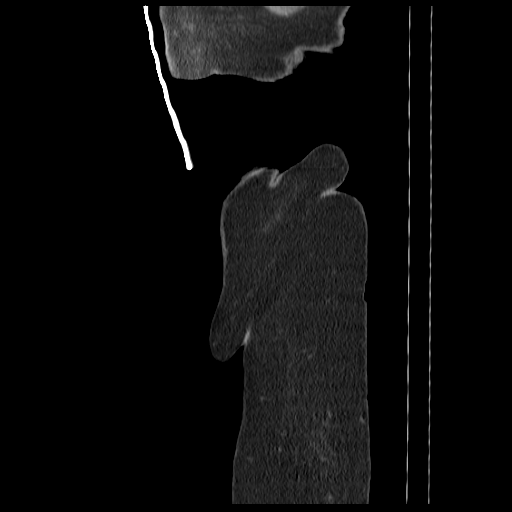

[Series 401: reformatted · coronal · 0.93mm/px · 4 of 160 slices shown (2 of 2)]
[im 18/160  soft-tissue]
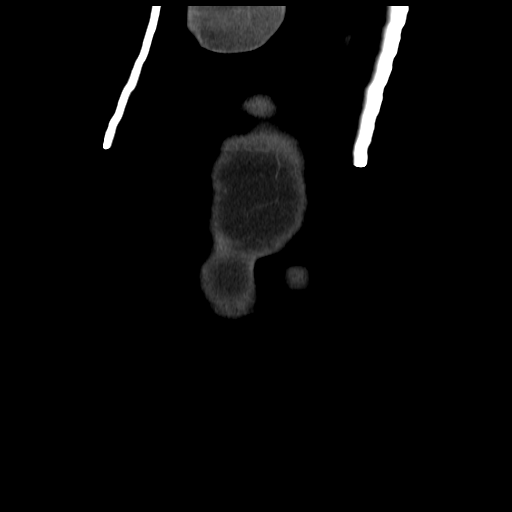
[im 36/160  soft-tissue]
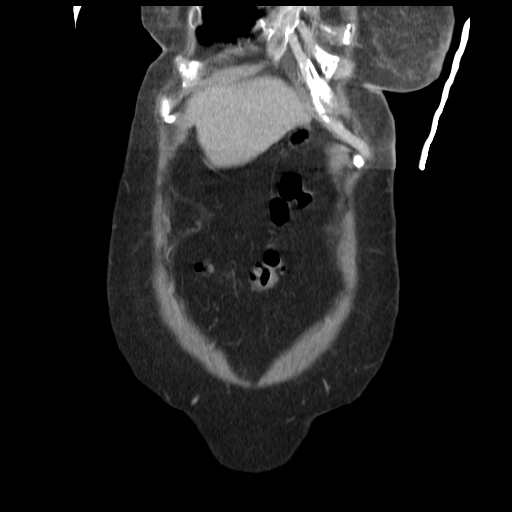
[im 54/160  soft-tissue]
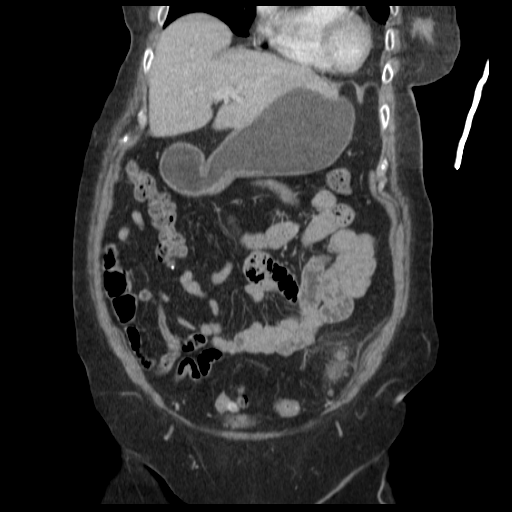
[im 71/160  soft-tissue]
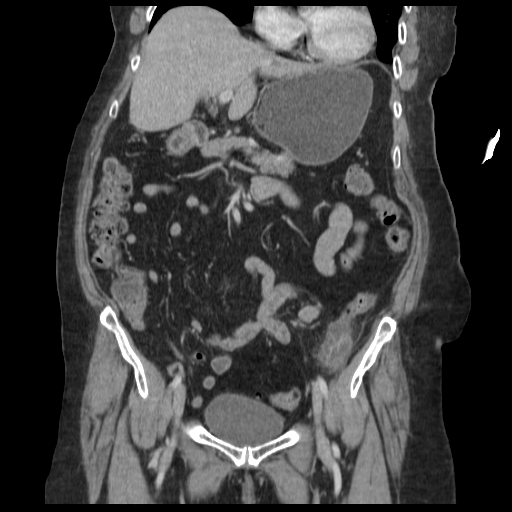

[15 of 32 positions shown; findings below may reference images not displayed]

FINDINGS: Subsegmental atelectasis noted in both lower lobes.

A small hiatal hernia is present.  Density in the left lateral
breast is likely incidental given the recent benign mammographic
assessment.

The liver, spleen, and pancreas appear unremarkable.  Thin mass of
the medial limb left adrenal gland measures 2.2 x 1.1 cm and has a
washout percentage of 39%, which is nonspecific.

There is a small peripheral cyst of the right kidney.  The left
kidney appears unremarkable.

Atherosclerosis of the abdominal aorta is present.

No pathologic retroperitoneal or porta hepatis adenopathy is
identified.

No dilated upper abdominal bowel noted.
IMPRESSION: 1.  Small hiatal hernia.
2.  Nonspecific small left adrenal mass is statistically likely
representing adenoma but technically nonspecific.

CT PELVIS
FINDINGS: There is abnormal mesenteric stranding around a
thickened segment of the proximal sigmoid colon, with multiple
diverticula, compatible with acute proximal sigmoid diverticulitis.
Given the somewhat focal and segmental nature, follow-up imaging or
endoscopy may be warranted to exclude the possibility of underlying
tumor.  However, benign diverticulitis is favored.

Numerous sigmoid diverticula are present.  No dilated small bowel
noted.  No free pelvic fluid or abscess is discretely identified.
No definite extraluminal gas noted.

Uterine contour appears normal.
IMPRESSION: 1.  Diverticulitis of the proximal sigmoid colon.  Follow-up
imaging or follow-up endoscopy may be warranted to exclude the
unlikely possibility of underlying tumor.

## 2011-05-03 DIAGNOSIS — E119 Type 2 diabetes mellitus without complications: Secondary | ICD-10-CM | POA: Diagnosis not present

## 2011-05-03 DIAGNOSIS — I10 Essential (primary) hypertension: Secondary | ICD-10-CM | POA: Diagnosis not present

## 2011-05-03 DIAGNOSIS — E782 Mixed hyperlipidemia: Secondary | ICD-10-CM | POA: Diagnosis not present

## 2011-08-02 DIAGNOSIS — I10 Essential (primary) hypertension: Secondary | ICD-10-CM | POA: Diagnosis not present

## 2011-08-02 DIAGNOSIS — E119 Type 2 diabetes mellitus without complications: Secondary | ICD-10-CM | POA: Diagnosis not present

## 2011-09-07 DIAGNOSIS — E785 Hyperlipidemia, unspecified: Secondary | ICD-10-CM | POA: Diagnosis not present

## 2011-09-07 DIAGNOSIS — I10 Essential (primary) hypertension: Secondary | ICD-10-CM | POA: Diagnosis not present

## 2011-10-11 DIAGNOSIS — E785 Hyperlipidemia, unspecified: Secondary | ICD-10-CM | POA: Diagnosis not present

## 2011-10-11 DIAGNOSIS — E119 Type 2 diabetes mellitus without complications: Secondary | ICD-10-CM | POA: Diagnosis not present

## 2011-10-11 DIAGNOSIS — E782 Mixed hyperlipidemia: Secondary | ICD-10-CM | POA: Diagnosis not present

## 2011-10-11 DIAGNOSIS — T887XXA Unspecified adverse effect of drug or medicament, initial encounter: Secondary | ICD-10-CM | POA: Diagnosis not present

## 2011-10-16 ENCOUNTER — Other Ambulatory Visit: Payer: Self-pay | Admitting: Oncology

## 2011-10-16 DIAGNOSIS — Z1231 Encounter for screening mammogram for malignant neoplasm of breast: Secondary | ICD-10-CM

## 2011-11-22 ENCOUNTER — Ambulatory Visit
Admission: RE | Admit: 2011-11-22 | Discharge: 2011-11-22 | Disposition: A | Discharge status: Home / Self Care | Payer: Medicare Other | Source: Ambulatory Visit | Attending: Oncology | Admitting: Oncology

## 2011-11-22 DIAGNOSIS — Z1231 Encounter for screening mammogram for malignant neoplasm of breast: Secondary | ICD-10-CM | POA: Diagnosis not present

## 2011-11-22 NOTE — Progress Notes (Signed)
Quick Note:  Please notify patient and call/fax these results to patient's doctors. ______ 

## 2011-11-26 ENCOUNTER — Telehealth: Payer: Self-pay | Admitting: Medical Oncology

## 2011-11-28 ENCOUNTER — Telehealth: Payer: Self-pay | Admitting: Medical Oncology

## 2011-11-28 NOTE — Telephone Encounter (Signed)
I called pt per Dr.Murinson to inform her that her mammogram is normal.

## 2011-12-25 DIAGNOSIS — Z85828 Personal history of other malignant neoplasm of skin: Secondary | ICD-10-CM | POA: Diagnosis not present

## 2011-12-25 DIAGNOSIS — L570 Actinic keratosis: Secondary | ICD-10-CM | POA: Diagnosis not present

## 2012-01-01 DIAGNOSIS — I10 Essential (primary) hypertension: Secondary | ICD-10-CM | POA: Diagnosis not present

## 2012-01-01 DIAGNOSIS — E782 Mixed hyperlipidemia: Secondary | ICD-10-CM | POA: Diagnosis not present

## 2012-01-17 DIAGNOSIS — E785 Hyperlipidemia, unspecified: Secondary | ICD-10-CM | POA: Diagnosis not present

## 2012-01-17 DIAGNOSIS — I10 Essential (primary) hypertension: Secondary | ICD-10-CM | POA: Diagnosis not present

## 2012-01-31 DIAGNOSIS — I10 Essential (primary) hypertension: Secondary | ICD-10-CM | POA: Diagnosis not present

## 2012-04-08 NOTE — Telephone Encounter (Signed)
Opened by accident

## 2012-04-17 DIAGNOSIS — E785 Hyperlipidemia, unspecified: Secondary | ICD-10-CM | POA: Diagnosis not present

## 2012-04-17 DIAGNOSIS — I10 Essential (primary) hypertension: Secondary | ICD-10-CM | POA: Diagnosis not present

## 2012-09-05 DIAGNOSIS — M169 Osteoarthritis of hip, unspecified: Secondary | ICD-10-CM | POA: Diagnosis not present

## 2012-09-05 DIAGNOSIS — M7500 Adhesive capsulitis of unspecified shoulder: Secondary | ICD-10-CM | POA: Diagnosis not present

## 2012-09-05 DIAGNOSIS — M1610 Unilateral primary osteoarthritis, unspecified hip: Secondary | ICD-10-CM | POA: Diagnosis not present

## 2012-10-01 DIAGNOSIS — M25559 Pain in unspecified hip: Secondary | ICD-10-CM | POA: Diagnosis not present

## 2012-10-06 DIAGNOSIS — M19019 Primary osteoarthritis, unspecified shoulder: Secondary | ICD-10-CM | POA: Diagnosis not present

## 2012-10-14 DIAGNOSIS — E785 Hyperlipidemia, unspecified: Secondary | ICD-10-CM | POA: Diagnosis not present

## 2012-10-14 DIAGNOSIS — I10 Essential (primary) hypertension: Secondary | ICD-10-CM | POA: Diagnosis not present

## 2012-10-27 ENCOUNTER — Other Ambulatory Visit: Payer: Self-pay

## 2012-10-27 DIAGNOSIS — Z1231 Encounter for screening mammogram for malignant neoplasm of breast: Secondary | ICD-10-CM

## 2012-11-04 DIAGNOSIS — M1610 Unilateral primary osteoarthritis, unspecified hip: Secondary | ICD-10-CM | POA: Diagnosis not present

## 2012-11-04 DIAGNOSIS — M169 Osteoarthritis of hip, unspecified: Secondary | ICD-10-CM | POA: Diagnosis not present

## 2012-11-07 DIAGNOSIS — M19019 Primary osteoarthritis, unspecified shoulder: Secondary | ICD-10-CM | POA: Diagnosis not present

## 2012-11-07 DIAGNOSIS — M169 Osteoarthritis of hip, unspecified: Secondary | ICD-10-CM | POA: Diagnosis not present

## 2012-11-07 DIAGNOSIS — M1610 Unilateral primary osteoarthritis, unspecified hip: Secondary | ICD-10-CM | POA: Diagnosis not present

## 2012-11-25 ENCOUNTER — Ambulatory Visit
Admission: RE | Admit: 2012-11-25 | Discharge: 2012-11-25 | Disposition: A | Discharge status: Home / Self Care | Payer: BC Managed Care – PPO | Source: Ambulatory Visit

## 2012-11-25 DIAGNOSIS — Z1231 Encounter for screening mammogram for malignant neoplasm of breast: Secondary | ICD-10-CM

## 2012-12-03 DIAGNOSIS — Z23 Encounter for immunization: Secondary | ICD-10-CM | POA: Diagnosis not present

## 2012-12-15 ENCOUNTER — Encounter: Payer: Self-pay | Admitting: Cardiovascular Disease

## 2012-12-15 ENCOUNTER — Ambulatory Visit (INDEPENDENT_AMBULATORY_CARE_PROVIDER_SITE_OTHER): Payer: Medicare Other | Admitting: Cardiovascular Disease

## 2012-12-15 VITALS — BP 148/72 | HR 81 | Resp 16 | Ht 63.0 in | Wt 169.3 lb

## 2012-12-15 DIAGNOSIS — I498 Other specified cardiac arrhythmias: Secondary | ICD-10-CM

## 2012-12-15 DIAGNOSIS — I2510 Atherosclerotic heart disease of native coronary artery without angina pectoris: Secondary | ICD-10-CM | POA: Diagnosis not present

## 2012-12-15 DIAGNOSIS — E785 Hyperlipidemia, unspecified: Secondary | ICD-10-CM

## 2012-12-15 DIAGNOSIS — R42 Dizziness and giddiness: Secondary | ICD-10-CM

## 2012-12-15 DIAGNOSIS — I10 Essential (primary) hypertension: Secondary | ICD-10-CM | POA: Diagnosis not present

## 2012-12-15 DIAGNOSIS — R002 Palpitations: Secondary | ICD-10-CM | POA: Diagnosis not present

## 2012-12-15 DIAGNOSIS — E119 Type 2 diabetes mellitus without complications: Secondary | ICD-10-CM

## 2012-12-15 NOTE — Patient Instructions (Signed)
Wear the Event Monitor for 30 days.  We will call with the test results as they come in.  Your physician recommends that you schedule a follow-up appointment in: 45 days after you have worn the Event Monitor for 30 days.

## 2012-12-16 ENCOUNTER — Encounter: Payer: Self-pay | Admitting: Cardiovascular Disease

## 2012-12-16 DIAGNOSIS — I498 Other specified cardiac arrhythmias: Secondary | ICD-10-CM | POA: Insufficient documentation

## 2012-12-16 DIAGNOSIS — E119 Type 2 diabetes mellitus without complications: Secondary | ICD-10-CM | POA: Insufficient documentation

## 2012-12-16 DIAGNOSIS — I2510 Atherosclerotic heart disease of native coronary artery without angina pectoris: Secondary | ICD-10-CM | POA: Insufficient documentation

## 2012-12-16 DIAGNOSIS — R7303 Prediabetes: Secondary | ICD-10-CM | POA: Insufficient documentation

## 2012-12-16 DIAGNOSIS — I10 Essential (primary) hypertension: Secondary | ICD-10-CM | POA: Insufficient documentation

## 2012-12-16 DIAGNOSIS — R42 Dizziness and giddiness: Secondary | ICD-10-CM | POA: Insufficient documentation

## 2012-12-16 DIAGNOSIS — E785 Hyperlipidemia, unspecified: Secondary | ICD-10-CM | POA: Insufficient documentation

## 2012-12-16 NOTE — Assessment & Plan Note (Signed)
Excellent control.   

## 2012-12-16 NOTE — Assessment & Plan Note (Signed)
He does not have any convincing symptoms to suggest coronary disease. Note that in 2011 her nuclear study showed a "false positive" inferior wall perfusion abnormality. Coronary angiography she did not have any stenoses in the vessels feeding the inferior wall, but had a incidentally found 50% stenosis in the LAD artery. Risk factor prevention is the main of therapy with lipid lowering and careful treatment of hyperglycemia. She is now borderline obese and is encouraged to show greater commitment to diet and exercise.

## 2012-12-16 NOTE — Assessment & Plan Note (Signed)
The blood pressure was borderline high on arrival today but when I rechecked it her blood pressure was 21308 mm Hg. no adjustments are made to her medications. Consider replacing the ACE inhibitor with a beta blocker for better arrhythmia control.

## 2012-12-16 NOTE — Assessment & Plan Note (Signed)
On previous visits I thought that her episodes of dizziness were most consistent with vertigo. The description that she revised today does not sound so convincing. The episodes are extremely brief and are not associated with changes in position of her body or her head. There is no associated "spinning sensation" and she does not have nausea. I wonder whether she may having random brief episodes of cerebral hyperperfusion related to arrhythmia. Her electrocardiogram today shows a very high frequency of premature ventricular beats. I've recommended that she wear an event monitor. We'll compare her symptoms with any arrhythmia recording. She is unaware of any palpitations and cannot tell that she is having very frequent PVCs today. I did not recommend any changes for her medications, but empirical therapy with a beta blocker can be tried after her event monitor is completed.

## 2012-12-16 NOTE — Progress Notes (Signed)
Patient ID: [name], female   DOB: [birth year], 79 y.o.   MRN: [id number]      Reason for office visit Presyncope  [name] is currently doing well. Recently she has began having more frequent episodes of dizziness, occasionally severe enough to be categorized as presyncope. The episodes generally happen when she is sitting down and are not associated with changes in position. She does not describe frank vertigo or nausea. The spells generally last for just a few seconds. They were bothering her a year ago that subsided and have only just recently restarted. She has never experienced full loss of consciousness. Denies chest pain or shortness of breath. She has occasional swelling in her legs that is consistently worse in the left ankle compared to the right. Labs checked by Dr. Vear Clock in September.   No Known Allergies  Current Outpatient Prescriptions  Medication Sig Dispense Refill  . aspirin EC 81 MG tablet Take 81 mg by mouth daily.      Marland Kitchen atorvastatin (LIPITOR) 10 MG tablet Take 10 mg by mouth daily.      . benazepril (LOTENSIN) 10 MG tablet Take 10 mg by mouth daily.      . meclizine (ANTIVERT) 25 MG tablet Take 25 mg by mouth 3 (three) times daily as needed.      . metFORMIN (GLUCOPHAGE) 850 MG tablet Take 850 mg by mouth 2 (two) times daily with a meal.       No current facility-administered medications for this visit.    Past Medical History  Diagnosis Date  . Chest pain   . Hypertension   . Hyperlipidemia   . Diabetes mellitus   . CAD (coronary artery disease)     Asymptomatic  . Hyperlipidemia 12/16/2012    Past Surgical History  Procedure Laterality Date  . Cardiac catheterization  07/13/2009    Recommendation - medical therapy  . Transthoracic echocardiogram  06/16/2009    EF >55%, no significant valvular abnormalities.  . Cardiovascular stress test  06/16/2009    Mild ischemia demonstrated in the basal inferior, mid inferior, and apical inferior regions.    . Gallbladder surgery  1986  . Breast lumpectomy with axillary lymph node dissection  2003    left    Family History  Problem Relation Age of Onset  . Heart attack Mother   . Cancer Mother   . Stroke Father   . Heart attack Brother   . Stroke Brother   . Stroke Brother     History   Social History  . Marital Status: Widowed    Spouse Name: N/A    Number of Children: N/A  . Years of Education: N/A   Occupational History  . Not on file.   Social History Main Topics  . Smoking status: Never Smoker   . Smokeless tobacco: Not on file  . Alcohol Use: No  . Drug Use: No  . Sexual Activity: Not on file   Other Topics Concern  . Not on file   Social History Narrative  . No narrative on file    Review of systems: The patient specifically denies any chest pain at rest or with exertion, dyspnea at rest or with exertion, orthopnea, paroxysmal nocturnal dyspnea, palpitations, focal neurological deficits, intermittent claudication, unexplained weight gain, cough, hemoptysis or wheezing.  The patient also denies abdominal pain, nausea, vomiting, dysphagia, diarrhea, constipation, polyuria, polydipsia, dysuria, hematuria, frequency, urgency, abnormal bleeding or bruising, fever, chills, unexpected weight changes, mood swings, change in  skin or hair texture, change in voice quality, auditory or visual problems, allergic reactions or rashes, new musculoskeletal complaints other than usual "aches and pains".   PHYSICAL EXAM BP 148/72  Pulse 81  Resp 16  Ht 5\' 3"  (1.6 m)  Wt 169 lb 4.8 oz (76.794 kg)  BMI 30.00 kg/m2  General: Alert, oriented x3, no distress Head: no evidence of trauma, PERRL, EOMI, no exophtalmos or lid lag, no myxedema, no xanthelasma; normal ears, nose and oropharynx Neck: normal jugular venous pulsations and no hepatojugular reflux; brisk carotid pulses without delay and no carotid bruits Chest: clear to auscultation, no signs of consolidation by percussion  or palpation, normal fremitus, symmetrical and full respiratory excursions Cardiovascular: normal position and quality of the apical impulse, bigeminal rhythm, normal first and second heart sounds, early peaking grade 2/6 systolic ejection murmur in the aortic focus, no rubs or gallops Abdomen: no tenderness or distention, no masses by palpation, no abnormal pulsatility or arterial bruits, normal bowel sounds, no hepatosplenomegaly Extremities: no clubbing, cyanosis or edema; 2+ radial, ulnar and brachial pulses bilaterally; 2+ right femoral, posterior tibial and dorsalis pedis pulses; 2+ left femoral, posterior tibial and dorsalis pedis pulses; no subclavian or femoral bruits Neurological: grossly nonfocal   EKG: Sinus rhythm with ventricular bigeminy. Slightly delayed R wave progression across the anterior precordium and borderline left axis deviation, otherwise normal tracing.  The PVCs are monomorphic with a left bundle branch block morphology and a leftward horizontal axis. A distinct retrograde P waves seen after each PVC  Lipid Panel  August 2013 total cholesterol 139, triglycerides 78, HDL 53, LDL 70 hemoglobin W4X 6.2%  BMET    Component Value Date/Time   NA 142 06/01/2010 0916   K 3.8 06/01/2010 0916   CL 101 06/01/2010 0916   CO2 25 06/01/2010 0916   GLUCOSE 177* 06/01/2010 0916   BUN 17 06/01/2010 0916   CREATININE 1.04 06/01/2010 0916   CALCIUM 10.1 06/01/2010 0916   GFRNONAA 27* 04/21/2007 0450   GFRAA  Value: 33        The eGFR has been calculated using the MDRD equation. This calculation has not been validated in all clinical* 04/21/2007 0450     ASSESSMENT AND PLAN Dizziness of unknown cause On previous visits I thought that her episodes of dizziness were most consistent with vertigo. The description that she revised today does not sound so convincing. The episodes are extremely brief and are not associated with changes in position of her body or her head. There is no associated  "spinning sensation" and she does not have nausea. I wonder whether she may having random brief episodes of cerebral hyperperfusion related to arrhythmia. Her electrocardiogram today shows a very high frequency of premature ventricular beats. I've recommended that she wear an event monitor. We'll compare her symptoms with any arrhythmia recording. She is unaware of any palpitations and cannot tell that she is having very frequent PVCs today. I did not recommend any changes for her medications, but empirical therapy with a beta blocker can be tried after her event monitor is completed.  CAD (coronary artery disease) He does not have any convincing symptoms to suggest coronary disease. Note that in 2011 her nuclear study showed a "false positive" inferior wall perfusion abnormality. Coronary angiography she did not have any stenoses in the vessels feeding the inferior wall, but had a incidentally found 50% stenosis in the LAD artery. Risk factor prevention is the main of therapy with lipid lowering and careful  treatment of hyperglycemia. She is now borderline obese and is encouraged to show greater commitment to diet and exercise.  Hyperlipidemia Her most recent lipid profile that I have available for review is more than a year-old which with excellent parameters. We'll obtain the more recent results from Dr. Vear Clock' office  HTN (hypertension) The blood pressure was borderline high on arrival today but when I rechecked it her blood pressure was 16109 mm Hg. no adjustments are made to her medications. Consider replacing the ACE inhibitor with a beta blocker for better arrhythmia control.   DM2 (diabetes mellitus, type 2) Excellent control   Orders Placed This Encounter  Procedures  . EKG 12-Lead   No orders of the defined types were placed in this encounter.    Junious Silk, MD, [facility] [phone] office [phone] pager

## 2012-12-16 NOTE — Assessment & Plan Note (Signed)
Her most recent lipid profile that I have available for review is more than a year-old which with excellent parameters. We'll obtain the more recent results from Dr. Vear Clock' office

## 2012-12-22 ENCOUNTER — Encounter: Payer: Self-pay | Admitting: Cardiovascular Disease

## 2012-12-24 ENCOUNTER — Telehealth: Payer: Self-pay | Admitting: Cardiovascular Disease

## 2012-12-24 NOTE — Telephone Encounter (Signed)
lmsg for pt to call and schedule her f/u appt w/ dr. Salena Saner.  Needs to see him after wearing monitor for 30 days...around week of December 15.

## 2013-01-13 DIAGNOSIS — E119 Type 2 diabetes mellitus without complications: Secondary | ICD-10-CM | POA: Diagnosis not present

## 2013-01-13 DIAGNOSIS — I10 Essential (primary) hypertension: Secondary | ICD-10-CM | POA: Diagnosis not present

## 2013-01-13 DIAGNOSIS — E785 Hyperlipidemia, unspecified: Secondary | ICD-10-CM | POA: Diagnosis not present

## 2013-01-23 ENCOUNTER — Telehealth: Payer: Self-pay | Admitting: Cardiovascular Disease

## 2013-01-23 ENCOUNTER — Ambulatory Visit (INDEPENDENT_AMBULATORY_CARE_PROVIDER_SITE_OTHER): Payer: Medicare Other | Admitting: Cardiovascular Disease

## 2013-01-23 VITALS — BP 140/70 | HR 60 | Resp 16 | Ht 63.0 in | Wt 166.0 lb

## 2013-01-23 DIAGNOSIS — I498 Other specified cardiac arrhythmias: Secondary | ICD-10-CM | POA: Diagnosis not present

## 2013-01-23 MED ORDER — ATENOLOL 25 MG PO TABS: 25.0000 mg | ORAL_TABLET | Freq: Every day | ORAL | Status: DC

## 2013-01-23 NOTE — Telephone Encounter (Signed)
Was in this morning to see Dr.Croitoru and he said that he was going to give her a prescription for Atenolol 25mg  and went to pick it up and wasn't there .Marland Kitchen Was'nt sure that we have her the pharmacy that she goes to which is [facility] [phone] Please call .Marland KitchenThanks

## 2013-01-23 NOTE — Telephone Encounter (Signed)
Returned call.  Left message to call back and Rx sent to pharmacy.  Stop benazepril.  Call back w/ questions.

## 2013-01-23 NOTE — Patient Instructions (Addendum)
  Stop taking your benazepril and start taking Atenolol 25 mg once daily. Make an appt. To see Dr. Royann Shivers in 3 Months

## 2013-02-02 ENCOUNTER — Telehealth: Payer: Self-pay | Admitting: Cardiovascular Disease

## 2013-02-02 NOTE — Telephone Encounter (Signed)
Call to daughter and no answer at work.  Call to home and left message to call back tomorrow before 4pm.  Call to pt and verified x 2.  Pt informed per MD and verbalized understanding.  Pt also given orthostatic precautions, advised increase water intake and elevate LEs when sitting or lying down.  Pt verbalized understanding and agreed w/ plan.  Pt will call back in 24-48 hrs w/ update.  Pt repeated dose of atenolol to take, which is 1/2 tab.

## 2013-02-02 NOTE — Telephone Encounter (Signed)
Returned call and pt verified x 2 w/ Arline Asp, pt's daughter.  Stated the medicine pt was given the other week has caused ankle swelling.  Stated pt also has dizziness and can't look up or down.  Stated HR is also low.  Not sure of pt's BP.  Stated pt's regular doctor's nurse lives near them and checked HR and did not tell her what the value was.  RN asked if pt has BP monitor at home to check and daughter stated she does, but would need help checking it.  Informed RN needs values of BP/HR to help give the best advice.  Daughter stated pt has gone to town w/ her son and she will try to contact her to see if she has the values the nurse got.  Stated the nurse seems to think the milligrams are too high.  Informed daughter RN can forward note to MD for advice, but w/o values of HR and BP it is difficult to advise over the phone.  Informed pt could also some in for BP check.  Daughter verbalized understanding and will call pt to get values if possible and call back.  RN did give orthostatic precautions and advised increase in water intake.  Message forwarded to Dr. Royann Shivers.

## 2013-02-02 NOTE — Telephone Encounter (Signed)
Have her take half of a tablet only - this is the smallest strength it comes in. May need to stop it, but would like to know her BP and HR before deciding that. Need to "wean off" anyway, so for time being just cut in half.

## 2013-02-02 NOTE — Telephone Encounter (Signed)
Please call-thinks she think the Atenolol is making her feet swelll and heart rate slower.

## 2013-02-03 ENCOUNTER — Telehealth: Payer: Self-pay | Admitting: Cardiovascular Disease

## 2013-02-03 NOTE — Telephone Encounter (Signed)
^^  Late Entry^^  Pt stated her niece checked HR b/c she didn't have a way to check BP and HR was in the 40s.  Pt denied having BP monitor at home.

## 2013-02-03 NOTE — Telephone Encounter (Signed)
Spoke to Ms [facility] states today's blood pressure was sitting 150/50; standing 158/56  Unable to obtain pulse rate.  Arline Asp states patient did not mention if she was dizzy or had problems with looking up or down.  Informed Arline Asp to continue with current treatment call if any problems

## 2013-02-03 NOTE — Telephone Encounter (Signed)
Wants to talk to nurse to give her BP readings on mother. Please call

## 2013-02-04 ENCOUNTER — Encounter: Payer: Self-pay | Admitting: Cardiovascular Disease

## 2013-02-04 NOTE — Progress Notes (Signed)
Patient ID: [name], female   DOB: 08-14-1929, 79 y.o.   MRN: [id number]      Reason for office visit Followup cardiac event monitor  [name] wore a 30 day event monitor for frequent dizziness.  The monitor did not show any evidence of sustained arrhythmia but does show very frequent PVCs, often a pattern of bigeminy or trigeminy. It is hard to say whether or not the arrhythmia is the direct cause of her dizziness. Her episodes of dizziness do occur randomly and lasts for only a few seconds which would be consistent with possible "effective bradycardia" from ventricular bigeminy.   No Known Allergies  Current Outpatient Prescriptions  Medication Sig Dispense Refill  . aspirin EC 81 MG tablet Take 81 mg by mouth daily.      Marland Kitchen atorvastatin (LIPITOR) 10 MG tablet Take 10 mg by mouth daily.      . meclizine (ANTIVERT) 25 MG tablet Take 25 mg by mouth 3 (three) times daily as needed.      . metFORMIN (GLUCOPHAGE) 850 MG tablet Take 850 mg by mouth 2 (two) times daily with a meal.      . atenolol (TENORMIN) 25 MG tablet Take 1 tablet (25 mg total) by mouth daily.  30 tablet  5   No current facility-administered medications for this visit.    Past Medical History  Diagnosis Date  . Chest pain   . Hypertension   . Hyperlipidemia   . Diabetes mellitus   . CAD (coronary artery disease)     Asymptomatic  . Hyperlipidemia 12/16/2012    Past Surgical History  Procedure Laterality Date  . Cardiac catheterization  07/13/2009    Recommendation - medical therapy  . Transthoracic echocardiogram  06/16/2009    EF >55%, no significant valvular abnormalities.  . Cardiovascular stress test  06/16/2009    Mild ischemia demonstrated in the basal inferior, mid inferior, and apical inferior regions.  . Gallbladder surgery  1986  . Breast lumpectomy with axillary lymph node dissection  2003    left    Family History  Problem Relation Age of Onset  . Heart attack Mother   . Cancer Mother     . Stroke Father   . Heart attack Brother   . Stroke Brother   . Stroke Brother     History   Social History  . Marital Status: Widowed    Spouse Name: N/A    Number of Children: N/A  . Years of Education: N/A   Occupational History  . Not on file.   Social History Main Topics  . Smoking status: Never Smoker   . Smokeless tobacco: Not on file  . Alcohol Use: No  . Drug Use: No  . Sexual Activity: Not on file   Other Topics Concern  . Not on file   Social History Narrative  . No narrative on file    Review of systems: The patient specifically denies any chest pain at rest or with exertion, dyspnea at rest or with exertion, orthopnea, paroxysmal nocturnal dyspnea, palpitations, focal neurological deficits, intermittent claudication, unexplained weight gain, cough, hemoptysis or wheezing.  The patient also denies abdominal pain, nausea, vomiting, dysphagia, diarrhea, constipation, polyuria, polydipsia, dysuria, hematuria, frequency, urgency, abnormal bleeding or bruising, fever, chills, unexpected weight changes, mood swings, change in skin or hair texture, change in voice quality, auditory or visual problems, allergic reactions or rashes, new musculoskeletal complaints other than usual "aches and pains".   PHYSICAL EXAM BP  140/70  Pulse 60  Ht 5\' 3"  (1.6 m)  Wt 166 lb (75.297 kg)  BMI 29.41 kg/m2 The patient specifically denies any chest pain at rest or with exertion, dyspnea at rest or with exertion, orthopnea, paroxysmal nocturnal dyspnea, palpitations, focal neurological deficits, intermittent claudication, unexplained weight gain, cough, hemoptysis or wheezing.  The patient also denies abdominal pain, nausea, vomiting, dysphagia, diarrhea, constipation, polyuria, polydipsia, dysuria, hematuria, frequency, urgency, abnormal bleeding or bruising, fever, chills, unexpected weight changes, mood swings, change in skin or hair texture, change in voice quality, auditory or  visual problems, allergic reactions or rashes, new musculoskeletal complaints other than usual "aches and pains".  PHYSICAL EXAM  BP 148/72  Pulse 81  Resp 16  Ht 5\' 3"  (1.6 m)  Wt 169 lb 4.8 oz (76.794 kg)  BMI 30.00 kg/m2  General: Alert, oriented x3, no distress  Head: no evidence of trauma, PERRL, EOMI, no exophtalmos or lid lag, no myxedema, no xanthelasma; normal ears, nose and oropharynx  Neck: normal jugular venous pulsations and no hepatojugular reflux; brisk carotid pulses without delay and no carotid bruits  Chest: clear to auscultation, no signs of consolidation by percussion or palpation, normal fremitus, symmetrical and full respiratory excursions  Cardiovascular: normal position and quality of the apical impulse, bigeminal rhythm, normal first and second heart sounds, early peaking grade 2/6 systolic ejection murmur in the aortic focus, no rubs or gallops  Abdomen: no tenderness or distention, no masses by palpation, no abnormal pulsatility or arterial bruits, normal bowel sounds, no hepatosplenomegaly  Extremities: no clubbing, cyanosis or edema; 2+ radial, ulnar and brachial pulses bilaterally; 2+ right femoral, posterior tibial and dorsalis pedis pulses; 2+ left femoral, posterior tibial and dorsalis pedis pulses; no subclavian or femoral bruits  Neurological: grossly nonfocal    EKG: Sinus rhythm with ventricular bigeminy. Slightly delayed R wave progression across the anterior precordium and borderline left axis deviation, otherwise normal tracing.   The PVCs are monomorphic with a left bundle branch block morphology and a leftward horizontal axis. A distinct retrograde P waves seen after each PVC   BMET    Component Value Date/Time   NA 142 06/01/2010 0916   K 3.8 06/01/2010 0916   CL 101 06/01/2010 0916   CO2 25 06/01/2010 0916   GLUCOSE 177* 06/01/2010 0916   BUN 17 06/01/2010 0916   CREATININE 1.04 06/01/2010 0916   CALCIUM 10.1 06/01/2010 0916   GFRNONAA 27*  04/21/2007 0450   GFRAA  Value: 33        The eGFR has been calculated using the MDRD equation. This calculation has not been validated in all clinical* 04/21/2007 0450     ASSESSMENT AND PLAN It is hard to know for sure whether her ventricular arrhythmia is responsible for her episodes of dizziness. In the past it  seemed more likely that she has true vertigo. We'll try empirical treatment with a low dose of a beta blocker to see if this improves her symptoms.  Junious Silk, MD, [facility] [phone] office [phone] pager

## 2013-02-23 ENCOUNTER — Telehealth: Payer: Self-pay | Admitting: Cardiovascular Disease

## 2013-02-23 NOTE — Telephone Encounter (Signed)
Message in Appt notes for provider to see triage note from today for information from Dr. Sallyanne Kuster.

## 2013-02-23 NOTE — Telephone Encounter (Signed)
When she comes in, please give samples of Bystolic 5 mg, to take 2.5 mg (1/2 tablet) daily INSTEAD of atenolol

## 2013-02-23 NOTE — Telephone Encounter (Signed)
Returned call and pt verified x 2.  Pt stated she went to the doctor this morning b/c she thought she had the flu.  Stated he told her she didn't have the flu and thinks it is her medicine.  Pt stated her medicine was changed and she is supposed to be taking a 1/2 of pill, atenolol 25 mg.  Pt denied having monitor to check BP at home.  Stated her nephew came to check it yesterday and she can't remember what he told her it was.  Pt stated it was good though.  Pt c/o feeling tired and having HAs.    Pt informed she will need to come in for BP check to determine if meds need to be adjusted.  Pt verbalized understanding and agreed w/ plan.  Appt offered as early as today and pt stated her son is off on Wednesday and Thursday and she wants an early AM appt.  First available early AM appt is on Thursday and pt scheduled appt for Thursday at [facility] w/ Kerin Ransom, PA-C.  Pt agreed to call back if symptoms worsen and she finds another ride for a sooner appt.

## 2013-02-23 NOTE — Telephone Encounter (Signed)
Please call-went to the doctor thought she had the flu.The doctor said she did not have the flu,thinks ishe having side effect from her Atenolol.

## 2013-02-24 ENCOUNTER — Ambulatory Visit (INDEPENDENT_AMBULATORY_CARE_PROVIDER_SITE_OTHER): Payer: Medicare Other | Admitting: Cardiovascular Disease

## 2013-02-24 VITALS — BP 160/90 | HR 72 | Ht 64.0 in | Wt 166.0 lb

## 2013-02-24 DIAGNOSIS — I2510 Atherosclerotic heart disease of native coronary artery without angina pectoris: Secondary | ICD-10-CM

## 2013-02-24 DIAGNOSIS — I10 Essential (primary) hypertension: Secondary | ICD-10-CM

## 2013-02-24 DIAGNOSIS — R42 Dizziness and giddiness: Secondary | ICD-10-CM | POA: Diagnosis not present

## 2013-02-24 DIAGNOSIS — I499 Cardiac arrhythmia, unspecified: Secondary | ICD-10-CM

## 2013-02-24 DIAGNOSIS — I498 Other specified cardiac arrhythmias: Secondary | ICD-10-CM

## 2013-02-24 MED ORDER — BENAZEPRIL HCL 10 MG PO TABS: 10.0000 mg | ORAL_TABLET | Freq: Every day | ORAL | Status: DC

## 2013-02-24 NOTE — Patient Instructions (Signed)
Your physician recommends that you schedule a follow-up appointment in: 1 year with Dr Timmie Foerster  STOP TAKING ATENOLOL  START BENAZEPRIL 10 MG DAILY

## 2013-02-26 ENCOUNTER — Encounter: Payer: Medicare Other | Admitting: Cardiology

## 2013-02-27 ENCOUNTER — Encounter: Payer: Self-pay | Admitting: Cardiovascular Disease

## 2013-02-27 NOTE — Progress Notes (Signed)
Patient ID: [name], female   DOB: Sep 14, 1929, 80 y.o.   MRN: [id number]      Reason for office visit Dizziness, PVCs  Mrs. Myszka wore a 30 day event monitor for frequent dizziness.  The monitor did not show any evidence of sustained arrhythmia but did show very frequent PVCs, often a pattern of bigeminy or trigeminy. It is hard to say whether or not the arrhythmia is the direct cause of her dizziness. Her episodes of dizziness do occur randomly and lasts for only a few seconds which would be consistent with possible "effective bradycardia" from ventricular bigeminy. Attempted palliative therapy with betablockers has actually made her feel worse - weak and depleted of energy. Even cutting the dose in half did not help. She has occasional PVCs while in the office, but appears oblivious to them    No Known Allergies  Current Outpatient Prescriptions  Medication Sig Dispense Refill  . aspirin EC 81 MG tablet Take 81 mg by mouth daily.      Marland Kitchen atorvastatin (LIPITOR) 10 MG tablet Take 10 mg by mouth daily.      . meclizine (ANTIVERT) 25 MG tablet Take 25 mg by mouth 3 (three) times daily as needed.      . metFORMIN (GLUCOPHAGE) 850 MG tablet Take 850 mg by mouth 2 (two) times daily with a meal.      . benazepril (LOTENSIN) 10 MG tablet Take 1 tablet (10 mg total) by mouth daily.  30 tablet  11   No current facility-administered medications for this visit.    Past Medical History  Diagnosis Date  . Chest pain   . Hypertension   . Hyperlipidemia   . Diabetes mellitus   . CAD (coronary artery disease)     Asymptomatic  . Hyperlipidemia 12/16/2012    Past Surgical History  Procedure Laterality Date  . Cardiac catheterization  07/13/2009    Recommendation - medical therapy  . Transthoracic echocardiogram  06/16/2009    EF >55%, no significant valvular abnormalities.  . Cardiovascular stress test  06/16/2009    Mild ischemia demonstrated in the basal inferior, mid inferior, and  apical inferior regions.  . Gallbladder surgery  1986  . Breast lumpectomy with axillary lymph node dissection  2003    left    Family History  Problem Relation Age of Onset  . Heart attack Mother   . Cancer Mother   . Stroke Father   . Heart attack Brother   . Stroke Brother   . Stroke Brother     History   Social History  . Marital Status: Widowed    Spouse Name: N/A    Number of Children: N/A  . Years of Education: N/A   Occupational History  . Not on file.   Social History Main Topics  . Smoking status: Never Smoker   . Smokeless tobacco: Not on file  . Alcohol Use: No  . Drug Use: No  . Sexual Activity: Not on file   Other Topics Concern  . Not on file   Social History Narrative  . No narrative on file    Review of systems: The patient specifically denies any chest pain at rest or with exertion, dyspnea at rest or with exertion, orthopnea, paroxysmal nocturnal dyspnea, palpitations, focal neurological deficits, intermittent claudication, unexplained weight gain, cough, hemoptysis or wheezing.  The patient also denies abdominal pain, nausea, vomiting, dysphagia, diarrhea, constipation, polyuria, polydipsia, dysuria, hematuria, frequency, urgency, abnormal bleeding or bruising, fever, chills,  unexpected weight changes, mood swings, change in skin or hair texture, change in voice quality, auditory or visual problems, allergic reactions or rashes, new musculoskeletal complaints other than usual "aches and pains".  PHYSICAL EXAM BP 160/90  Pulse 72  Ht 5' 4"  (1.626 m)  Wt 75.297 kg (166 lb)  BMI 28.48 kg/m2 General: Alert, oriented x3, no distress  Head: no evidence of trauma, PERRL, EOMI, no exophtalmos or lid lag, no myxedema, no xanthelasma; normal ears, nose and oropharynx  Neck: normal jugular venous pulsations and no hepatojugular reflux; brisk carotid pulses without delay and no carotid bruits  Chest: clear to auscultation, no signs of consolidation by  percussion or palpation, normal fremitus, symmetrical and full respiratory excursions  Cardiovascular: normal position and quality of the apical impulse, bigeminal rhythm, normal first and second heart sounds, early peaking grade 2/6 systolic ejection murmur in the aortic focus, no rubs or gallops  Abdomen: no tenderness or distention, no masses by palpation, no abnormal pulsatility or arterial bruits, normal bowel sounds, no hepatosplenomegaly  Extremities: no clubbing, cyanosis or edema; 2+ radial, ulnar and brachial pulses bilaterally; 2+ right femoral, posterior tibial and dorsalis pedis pulses; 2+ left femoral, posterior tibial and dorsalis pedis pulses; no subclavian or femoral bruits  Neurological: grossly nonfocal    EKG: Sinus rhythm with ventricular bigeminy. Slightly delayed R wave progression across the anterior precordium and borderline left axis deviation, otherwise normal tracing.  The PVCs are monomorphic with a left bundle branch block morphology and a leftward horizontal axis. A distinct retrograde P waves seen after each PVC  BMET    Component Value Date/Time   NA 142 06/01/2010 0916   K 3.8 06/01/2010 0916   CL 101 06/01/2010 0916   CO2 25 06/01/2010 0916   GLUCOSE 177* 06/01/2010 0916   BUN 17 06/01/2010 0916   CREATININE 1.04 06/01/2010 0916   CALCIUM 10.1 06/01/2010 0916   GFRNONAA 27* 04/21/2007 0450   GFRAA  Value: 33        The eGFR has been calculated using the MDRD equation. This calculation has not been validated in all clinical* 04/21/2007 0450     ASSESSMENT AND PLAN Empirical betablocker therapy has not helped and actually makes her feel worse. Will stop the betablocker. Findings today again suggest that the PVCs are not directly related to her complaints. Will put her back on the benazepril that she previously was taking for HTN.  Patient Instructions  Your physician recommends that you schedule a follow-up appointment in: 1 year with Dr Timmie Foerster  STOP TAKING  ATENOLOL  START BENAZEPRIL 10 MG DAILY     Orders Placed This Encounter  Procedures  . EKG 12-Lead   Meds ordered this encounter  Medications  . benazepril (LOTENSIN) 10 MG tablet    Sig: Take 1 tablet (10 mg total) by mouth daily.    Dispense:  30 tablet    Refill:  [address] [place], [facility] [phone] office [phone] pager

## 2013-03-05 DIAGNOSIS — M1610 Unilateral primary osteoarthritis, unspecified hip: Secondary | ICD-10-CM | POA: Diagnosis not present

## 2013-03-05 DIAGNOSIS — M169 Osteoarthritis of hip, unspecified: Secondary | ICD-10-CM | POA: Diagnosis not present

## 2013-03-17 DIAGNOSIS — L821 Other seborrheic keratosis: Secondary | ICD-10-CM | POA: Diagnosis not present

## 2013-03-17 DIAGNOSIS — D485 Neoplasm of uncertain behavior of skin: Secondary | ICD-10-CM | POA: Diagnosis not present

## 2013-03-17 DIAGNOSIS — Z85828 Personal history of other malignant neoplasm of skin: Secondary | ICD-10-CM | POA: Diagnosis not present

## 2013-03-17 DIAGNOSIS — L570 Actinic keratosis: Secondary | ICD-10-CM | POA: Diagnosis not present

## 2013-03-17 DIAGNOSIS — D235 Other benign neoplasm of skin of trunk: Secondary | ICD-10-CM | POA: Diagnosis not present

## 2013-04-13 DIAGNOSIS — Z85828 Personal history of other malignant neoplasm of skin: Secondary | ICD-10-CM | POA: Diagnosis not present

## 2013-04-13 DIAGNOSIS — C44121 Squamous cell carcinoma of skin of unspecified eyelid, including canthus: Secondary | ICD-10-CM | POA: Diagnosis not present

## 2013-04-14 DIAGNOSIS — E119 Type 2 diabetes mellitus without complications: Secondary | ICD-10-CM | POA: Diagnosis not present

## 2013-04-14 DIAGNOSIS — I10 Essential (primary) hypertension: Secondary | ICD-10-CM | POA: Diagnosis not present

## 2013-04-23 ENCOUNTER — Ambulatory Visit: Payer: Medicare Other | Admitting: Cardiovascular Disease

## 2013-05-01 DIAGNOSIS — Z85828 Personal history of other malignant neoplasm of skin: Secondary | ICD-10-CM | POA: Diagnosis not present

## 2013-05-01 DIAGNOSIS — C44121 Squamous cell carcinoma of skin of unspecified eyelid, including canthus: Secondary | ICD-10-CM | POA: Diagnosis not present

## 2013-07-14 DIAGNOSIS — E785 Hyperlipidemia, unspecified: Secondary | ICD-10-CM | POA: Diagnosis not present

## 2013-07-14 DIAGNOSIS — I10 Essential (primary) hypertension: Secondary | ICD-10-CM | POA: Diagnosis not present

## 2013-07-14 DIAGNOSIS — T887XXA Unspecified adverse effect of drug or medicament, initial encounter: Secondary | ICD-10-CM | POA: Diagnosis not present

## 2013-10-13 DIAGNOSIS — I10 Essential (primary) hypertension: Secondary | ICD-10-CM | POA: Diagnosis not present

## 2013-10-13 DIAGNOSIS — E119 Type 2 diabetes mellitus without complications: Secondary | ICD-10-CM | POA: Diagnosis not present

## 2013-10-13 DIAGNOSIS — E782 Mixed hyperlipidemia: Secondary | ICD-10-CM | POA: Diagnosis not present

## 2013-10-22 ENCOUNTER — Other Ambulatory Visit: Payer: Self-pay

## 2013-10-22 DIAGNOSIS — Z1231 Encounter for screening mammogram for malignant neoplasm of breast: Secondary | ICD-10-CM

## 2013-11-27 ENCOUNTER — Ambulatory Visit
Admission: RE | Admit: 2013-11-27 | Discharge: 2013-11-27 | Disposition: A | Discharge status: Home / Self Care | Payer: Medicare Other | Source: Ambulatory Visit

## 2013-11-27 DIAGNOSIS — Z1231 Encounter for screening mammogram for malignant neoplasm of breast: Secondary | ICD-10-CM | POA: Diagnosis not present

## 2014-01-12 DIAGNOSIS — I10 Essential (primary) hypertension: Secondary | ICD-10-CM | POA: Diagnosis not present

## 2014-02-24 ENCOUNTER — Ambulatory Visit (INDEPENDENT_AMBULATORY_CARE_PROVIDER_SITE_OTHER): Payer: Medicare Other | Admitting: Cardiovascular Disease

## 2014-02-24 ENCOUNTER — Encounter: Payer: Self-pay | Admitting: Cardiovascular Disease

## 2014-02-24 VITALS — BP 141/81 | HR 74 | Resp 16 | Ht 63.0 in | Wt 163.5 lb

## 2014-02-24 DIAGNOSIS — I10 Essential (primary) hypertension: Secondary | ICD-10-CM

## 2014-02-24 DIAGNOSIS — I2510 Atherosclerotic heart disease of native coronary artery without angina pectoris: Secondary | ICD-10-CM | POA: Diagnosis not present

## 2014-02-24 DIAGNOSIS — E785 Hyperlipidemia, unspecified: Secondary | ICD-10-CM | POA: Diagnosis not present

## 2014-02-24 DIAGNOSIS — I498 Other specified cardiac arrhythmias: Secondary | ICD-10-CM

## 2014-02-24 NOTE — Patient Instructions (Signed)
Dr. Sallyanne Kuster recommends that you schedule a follow-up appointment in: as needed

## 2014-02-24 NOTE — Progress Notes (Signed)
Patient ID: [name], female   DOB: 12/05/1929, 81 y.o.   MRN: [id number]     Reason for office visit PVCs, coronary risk factor management  [name] feels well and is no longer troubled by dizziness. No other cardiovascular complaints today.  She has incessant PVCs on exam (5 of 12 beats on her 10 seconds ECG), but is completely unaware of them today, making it less likely that they were responsible for past complaints of dizziness.  Coronary angio in 2011 showed mild luminal irregularities, no flow limiting stenoses. Normal echo same year.  No Known Allergies  Current Outpatient Prescriptions  Medication Sig Dispense Refill  . aspirin EC 81 MG tablet Take 81 mg by mouth daily.    Marland Kitchen atorvastatin (LIPITOR) 10 MG tablet Take 10 mg by mouth daily.    . benazepril-hydrochlorthiazide (LOTENSIN HCT) 10-12.5 MG per tablet Take 1 tablet by mouth daily.     . meclizine (ANTIVERT) 25 MG tablet Take 25 mg by mouth 3 (three) times daily as needed.    . metFORMIN (GLUCOPHAGE) 850 MG tablet Take 850 mg by mouth 2 (two) times daily with a meal.     No current facility-administered medications for this visit.    Past Medical History  Diagnosis Date  . Chest pain   . Hypertension   . Hyperlipidemia   . Diabetes mellitus   . CAD (coronary artery disease)     Asymptomatic  . Hyperlipidemia 12/16/2012    Past Surgical History  Procedure Laterality Date  . Cardiac catheterization  07/13/2009    Recommendation - medical therapy  . Transthoracic echocardiogram  06/16/2009    EF >55%, no significant valvular abnormalities.  . Cardiovascular stress test  06/16/2009    Mild ischemia demonstrated in the basal inferior, mid inferior, and apical inferior regions.  . Gallbladder surgery  1986  . Breast lumpectomy with axillary lymph node dissection  2003    left    Family History  Problem Relation Age of Onset  . Heart attack Mother   . Cancer Mother   . Stroke Father   . Heart attack  Brother   . Stroke Brother   . Stroke Brother     History   Social History  . Marital Status: Widowed    Spouse Name: N/A    Number of Children: N/A  . Years of Education: N/A   Occupational History  . Not on file.   Social History Main Topics  . Smoking status: Never Smoker   . Smokeless tobacco: Not on file  . Alcohol Use: No  . Drug Use: No  . Sexual Activity: Not on file   Other Topics Concern  . Not on file   Social History Narrative    Review of systems: The patient specifically denies any chest pain at rest or with exertion, dyspnea at rest or with exertion, orthopnea, paroxysmal nocturnal dyspnea, syncope, palpitations, focal neurological deficits, intermittent claudication, lower extremity edema, unexplained weight gain, cough, hemoptysis or wheezing.  The patient also denies abdominal pain, nausea, vomiting, dysphagia, diarrhea, constipation, polyuria, polydipsia, dysuria, hematuria, frequency, urgency, abnormal bleeding or bruising, fever, chills, unexpected weight changes, mood swings, change in skin or hair texture, change in voice quality, auditory or visual problems, allergic reactions or rashes, new musculoskeletal complaints other than usual "aches and pains".   PHYSICAL EXAM BP 141/81 mmHg  Pulse 74  Resp 16  Ht 5' 3"  (1.6 m)  Wt 163 lb 8 oz (74.163 kg)  BMI 28.97 kg/m2  General: Alert, oriented x3, no distress Head: no evidence of trauma, PERRL, EOMI, no exophtalmos or lid lag, no myxedema, no xanthelasma; normal ears, nose and oropharynx Neck: normal jugular venous pulsations and no hepatojugular reflux; brisk carotid pulses without delay and no carotid bruits Chest: clear to auscultation, no signs of consolidation by percussion or palpation, normal fremitus, symmetrical and full respiratory excursions Cardiovascular: normal position and quality of the apical impulse, regular rhythm baseline with very frequent ectopy, normal first and second heart  sounds, no murmurs, rubs or gallops Abdomen: no tenderness or distention, no masses by palpation, no abnormal pulsatility or arterial bruits, normal bowel sounds, no hepatosplenomegaly Extremities: no clubbing, cyanosis or edema; 2+ radial, ulnar and brachial pulses bilaterally; 2+ right femoral, posterior tibial and dorsalis pedis pulses; 2+ left femoral, posterior tibial and dorsalis pedis pulses; no subclavian or femoral bruits Neurological: grossly nonfocal   EKG: NSR with frequent monomorphic PVCs, subtly inverted T waves in V3-V6  Lipid Panel REPORTS LIPIDS WITH PCP WERE OK LAST MONTHS  BMET    Component Value Date/Time   NA 142 06/01/2010 0916   K 3.8 06/01/2010 0916   CL 101 06/01/2010 0916   CO2 25 06/01/2010 0916   GLUCOSE 177* 06/01/2010 0916   BUN 17 06/01/2010 0916   CREATININE 1.04 06/01/2010 0916   CALCIUM 10.1 06/01/2010 0916   GFRNONAA 27* 04/21/2007 0450   GFRAA * 04/21/2007 0450    33        The eGFR has been calculated using the MDRD equation. This calculation has not been validated in all clinical     ASSESSMENT AND PLAN  In the absence of significant structural heart disease or symptoms, there is no indication for specific therapy for her PVCs. Empirical beta blockers were tried last year and made her feel worse.  Orders Placed This Encounter  Procedures  . EKG 12-Lead   Meds ordered this encounter  Medications  . benazepril-hydrochlorthiazide (LOTENSIN HCT) 10-12.5 MG per tablet    Sig: Take 1 tablet by mouth daily.     Holli Humbles, MD, [place] [phone] office [phone] pager

## 2014-02-25 ENCOUNTER — Encounter: Payer: Self-pay | Admitting: Cardiovascular Disease

## 2014-03-16 DIAGNOSIS — X32XXXA Exposure to sunlight, initial encounter: Secondary | ICD-10-CM | POA: Diagnosis not present

## 2014-03-16 DIAGNOSIS — D045 Carcinoma in situ of skin of trunk: Secondary | ICD-10-CM | POA: Diagnosis not present

## 2014-03-16 DIAGNOSIS — D485 Neoplasm of uncertain behavior of skin: Secondary | ICD-10-CM | POA: Diagnosis not present

## 2014-03-16 DIAGNOSIS — L089 Local infection of the skin and subcutaneous tissue, unspecified: Secondary | ICD-10-CM | POA: Diagnosis not present

## 2014-03-16 DIAGNOSIS — L989 Disorder of the skin and subcutaneous tissue, unspecified: Secondary | ICD-10-CM | POA: Diagnosis not present

## 2014-03-16 DIAGNOSIS — Z85828 Personal history of other malignant neoplasm of skin: Secondary | ICD-10-CM | POA: Diagnosis not present

## 2014-03-16 DIAGNOSIS — L570 Actinic keratosis: Secondary | ICD-10-CM | POA: Diagnosis not present

## 2014-03-30 DIAGNOSIS — D045 Carcinoma in situ of skin of trunk: Secondary | ICD-10-CM | POA: Diagnosis not present

## 2014-07-02 DIAGNOSIS — M1612 Unilateral primary osteoarthritis, left hip: Secondary | ICD-10-CM | POA: Diagnosis not present

## 2014-07-19 ENCOUNTER — Telehealth: Payer: Self-pay

## 2014-07-19 NOTE — Telephone Encounter (Signed)
Patient is cleared for Left Hip: THA w/wo autograft/allograft by Dr Gaynelle Arabian scheduled for 11/03/2014 per Dr Sallyanne Kuster - low risk for major CV complications, Note very frequent asymptomatic PVCs that do not require therapy.  Clearance form faxed to Abigail Butts at [facility].

## 2014-07-27 DIAGNOSIS — Z85828 Personal history of other malignant neoplasm of skin: Secondary | ICD-10-CM | POA: Diagnosis not present

## 2014-07-27 DIAGNOSIS — L309 Dermatitis, unspecified: Secondary | ICD-10-CM | POA: Diagnosis not present

## 2014-07-27 DIAGNOSIS — I10 Essential (primary) hypertension: Secondary | ICD-10-CM | POA: Diagnosis not present

## 2014-07-27 DIAGNOSIS — D044 Carcinoma in situ of skin of scalp and neck: Secondary | ICD-10-CM | POA: Diagnosis not present

## 2014-08-06 DIAGNOSIS — M1612 Unilateral primary osteoarthritis, left hip: Secondary | ICD-10-CM | POA: Diagnosis not present

## 2014-10-19 ENCOUNTER — Ambulatory Visit: Payer: Self-pay | Admitting: Orthopedic Surgery

## 2014-10-19 NOTE — Progress Notes (Signed)
Preoperative surgical orders have been place into the Epic hospital system for [place] on 10/19/2014, 7:45 AM  by Mickel Crow for surgery on 11-03-2014.  Preop Total Hip - Anterior Approach orders including IV Tylenol, and IV Decadron as long as there are no contraindications to the above medications. Arlee Muslim, PA-C

## 2014-10-28 ENCOUNTER — Encounter (HOSPITAL_COMMUNITY): Payer: Self-pay

## 2014-10-28 ENCOUNTER — Encounter (HOSPITAL_COMMUNITY)
Admission: RE | Admit: 2014-10-28 | Discharge: 2014-10-28 | Disposition: A | Discharge status: Home / Self Care | Payer: Medicare Other | Source: Ambulatory Visit | Attending: Orthopedic Surgery | Admitting: Orthopedic Surgery

## 2014-10-28 DIAGNOSIS — Z01818 Encounter for other preprocedural examination: Secondary | ICD-10-CM | POA: Insufficient documentation

## 2014-10-28 DIAGNOSIS — M1612 Unilateral primary osteoarthritis, left hip: Secondary | ICD-10-CM | POA: Diagnosis not present

## 2014-10-28 HISTORY — DX: Personal history of other infectious and parasitic diseases: Z86.19

## 2014-10-28 HISTORY — DX: Dizziness and giddiness: R42

## 2014-10-28 HISTORY — DX: Unspecified osteoarthritis, unspecified site: M19.90

## 2014-10-28 LAB — PROTIME-INR
INR: 1 (ref 0.00–1.49)
Prothrombin Time: 13.4 seconds (ref 11.6–15.2)

## 2014-10-28 LAB — COMPREHENSIVE METABOLIC PANEL
ALBUMIN: 4.1 g/dL (ref 3.5–5.0)
ALK PHOS: 121 U/L (ref 38–126)
ALT: 27 U/L (ref 14–54)
ANION GAP: 9 (ref 5–15)
AST: 32 U/L (ref 15–41)
BILIRUBIN TOTAL: 0.7 mg/dL (ref 0.3–1.2)
BUN: 18 mg/dL (ref 6–20)
CALCIUM: 9.5 mg/dL (ref 8.9–10.3)
CO2: 25 mmol/L (ref 22–32)
Chloride: 105 mmol/L (ref 101–111)
Creatinine, Ser: 1.19 mg/dL — ABNORMAL HIGH (ref 0.44–1.00)
GFR, EST AFRICAN AMERICAN: 48 mL/min — AB (ref >60)
GFR, EST NON AFRICAN AMERICAN: 41 mL/min — AB (ref >60)
GLUCOSE: 123 mg/dL — AB (ref 65–99)
Potassium: 4.6 mmol/L (ref 3.5–5.1)
Sodium: 139 mmol/L (ref 135–145)
TOTAL PROTEIN: 6.9 g/dL (ref 6.5–8.1)

## 2014-10-28 LAB — URINALYSIS, ROUTINE W REFLEX MICROSCOPIC
Bilirubin Urine: NEGATIVE
Glucose, UA: NEGATIVE mg/dL
HGB URINE DIPSTICK: NEGATIVE
Ketones, ur: NEGATIVE mg/dL
Nitrite: NEGATIVE
Protein, ur: NEGATIVE mg/dL
SPECIFIC GRAVITY, URINE: 1.023 (ref 1.005–1.030)
UROBILINOGEN UA: 0.2 mg/dL (ref 0.0–1.0)
pH: 5 (ref 5.0–8.0)

## 2014-10-28 LAB — CBC
HEMATOCRIT: 36.3 % (ref 36.0–46.0)
HEMOGLOBIN: 12.2 g/dL (ref 12.0–15.0)
MCH: 32.6 pg (ref 26.0–34.0)
MCHC: 33.6 g/dL (ref 30.0–36.0)
MCV: 97.1 fL (ref 78.0–100.0)
Platelets: 280 10*3/uL (ref 150–400)
RBC: 3.74 MIL/uL — ABNORMAL LOW (ref 3.87–5.11)
RDW: 14 % (ref 11.5–15.5)
WBC: 5.8 10*3/uL (ref 4.0–10.5)

## 2014-10-28 LAB — URINE MICROSCOPIC-ADD ON

## 2014-10-28 LAB — SURGICAL PCR SCREEN
MRSA, PCR: NEGATIVE
Staphylococcus aureus: NEGATIVE

## 2014-10-28 LAB — APTT: aPTT: 24 seconds (ref 24–37)

## 2014-10-28 NOTE — Patient Instructions (Addendum)
YOUR PROCEDURE IS SCHEDULED ON :11/02/13  REPORT TO [facility] MAIN ENTRANCE FOLLOW SIGNS TO EAST ELEVATOR - GO TO 3rd FLOOR CHECK IN AT 3 EAST NURSES STATION (SHORT STAY) AT: 6:30 AM  CALL THIS NUMBER IF YOU HAVE PROBLEMS THE MORNING OF SURGERY [phone]  REMEMBER:ONLY 1 PER PERSON MAY GO TO SHORT STAY WITH YOU TO GET READY THE MORNING OF YOUR SURGERY  DO NOT EAT FOOD OR DRINK LIQUIDS AFTER MIDNIGHT  TAKE THESE MEDICINES THE MORNING OF SURGERY: NONE  YOU MAY NOT HAVE ANY METAL ON YOUR BODY INCLUDING HAIR PINS AND PIERCING'S. DO NOT WEAR JEWELRY, MAKEUP, LOTIONS, POWDERS OR PERFUMES. DO NOT WEAR NAIL POLISH. DO NOT SHAVE 48 HRS PRIOR TO SURGERY. MEN MAY SHAVE FACE AND NECK.  DO NOT [place]. [facility] IS NOT RESPONSIBLE FOR VALUABLES.  CONTACTS, DENTURES OR PARTIALS MAY NOT BE WORN TO SURGERY. LEAVE SUITCASE IN CAR. CAN BE BROUGHT TO ROOM AFTER SURGERY.  PATIENTS DISCHARGED THE DAY OF SURGERY WILL NOT BE ALLOWED TO DRIVE HOME.  PLEASE READ OVER THE FOLLOWING INSTRUCTION SHEETS _________________________________________________________________________________                                            [facility] - PREPARING FOR SURGERY  Before surgery, you can play an important role.  Because skin is not sterile, your skin needs to be as free of germs as possible.  You can reduce the number of germs on your skin by washing with CHG (chlorahexidine gluconate) soap before surgery.  CHG is an antiseptic cleaner which kills germs and bonds with the skin to continue killing germs even after washing. Please DO NOT use if you have an allergy to CHG or antibacterial soaps.  If your skin becomes reddened/irritated stop using the CHG and inform your nurse when you arrive at Short Stay. Do not shave (including legs and underarms) for at least 48 hours prior to the first CHG shower.  You may shave your face. Please follow these instructions  carefully:   1.  Shower with CHG Soap the night before surgery and the  morning of Surgery.   2.  If you choose to wash your hair, wash your hair first as usual with your  normal  Shampoo.   3.  After you shampoo, rinse your hair and body thoroughly to remove the  shampoo.                                         4.  Use CHG as you would any other liquid soap.  You can apply chg directly  to the skin and wash . Gently wash with scrungie or clean wascloth    5.  Apply the CHG Soap to your body ONLY FROM THE NECK DOWN.   Do not use on open                           Wound or open sores. Avoid contact with eyes, ears mouth and genitals (private parts).                        Genitals (private parts) with your normal soap.  6.  Wash thoroughly, paying special attention to the area where your surgery  will be performed.   7.  Thoroughly rinse your body with warm water from the neck down.   8.  DO NOT shower/wash with your normal soap after using and rinsing off  the CHG Soap .                9.  Pat yourself dry with a clean towel.             10.  Wear clean night clothes to bed after shower             11.  Place clean sheets on your bed the night of your first shower and do not  sleep with pets.  Day of Surgery : Do not apply any lotions/deodorants the morning of surgery.  Please wear clean clothes to the hospital/surgery center.  FAILURE TO FOLLOW THESE INSTRUCTIONS MAY RESULT IN THE CANCELLATION OF YOUR SURGERY    PATIENT SIGNATURE_________________________________  ______________________________________________________________________     WHAT IS A BLOOD TRANSFUSION? Blood Transfusion Information  A transfusion is the replacement of blood or some of its parts. Blood is made up of multiple cells which provide different functions.  Red blood cells carry oxygen and are used for blood loss replacement.  White blood cells fight against infection.  Platelets  control bleeding.  Plasma helps clot blood.  Other blood products are available for specialized needs, such as hemophilia or other clotting disorders. BEFORE THE TRANSFUSION  Who gives blood for transfusions?   Healthy volunteers who are fully evaluated to make sure their blood is safe. This is blood bank blood. Transfusion therapy is the safest it has ever been in the practice of medicine. Before blood is taken from a donor, a complete history is taken to make sure that person has no history of diseases nor engages in risky social behavior (examples are intravenous drug use or sexual activity with multiple partners). The donor's travel history is screened to minimize risk of transmitting infections, such as malaria. The donated blood is tested for signs of infectious diseases, such as HIV and hepatitis. The blood is then tested to be sure it is compatible with you in order to minimize the chance of a transfusion reaction. If you or a relative donates blood, this is often done in anticipation of surgery and is not appropriate for emergency situations. It takes many days to process the donated blood. RISKS AND COMPLICATIONS Although transfusion therapy is very safe and saves many lives, the main dangers of transfusion include:  1. Getting an infectious disease. 2. Developing a transfusion reaction. This is an allergic reaction to something in the blood you were given. Every precaution is taken to prevent this. The decision to have a blood transfusion has been considered carefully by your caregiver before blood is given. Blood is not given unless the benefits outweigh the risks. AFTER THE TRANSFUSION  Right after receiving a blood transfusion, you will usually feel much better and more energetic. This is especially true if your red blood cells have gotten low (anemic). The transfusion raises the level of the red blood cells which carry oxygen, and this usually causes an energy increase.  The nurse  administering the transfusion will monitor you carefully for complications. HOME CARE INSTRUCTIONS  No special instructions are needed after a transfusion. You may find your energy is better. Speak with your caregiver about any limitations on activity for underlying diseases you may  have. SEEK MEDICAL CARE IF:   Your condition is not improving after your transfusion.  You develop redness or irritation at the intravenous (IV) site. SEEK IMMEDIATE MEDICAL CARE IF:  Any of the following symptoms occur over the next 12 hours:  Shaking chills.  You have a temperature by mouth above 102 F (38.9 C), not controlled by medicine.  Chest, back, or muscle pain.  People around you feel you are not acting correctly or are confused.  Shortness of breath or difficulty breathing.  Dizziness and fainting.  You get a rash or develop hives.  You have a decrease in urine output.  Your urine turns a dark color or changes to pink, red, or brown. Any of the following symptoms occur over the next 10 days:  You have a temperature by mouth above 102 F (38.9 C), not controlled by medicine.  Shortness of breath.  Weakness after normal activity.  The white part of the eye turns yellow (jaundice).  You have a decrease in the amount of urine or are urinating less often.  Your urine turns a dark color or changes to pink, red, or brown. Document Released: 01/27/2000 Document Revised: 04/23/2011 Document Reviewed: 09/15/2007 ExitCare Patient Information 2014 [place].  _______________________________________________________________________  Incentive Spirometer  An incentive spirometer is a tool that can help keep your lungs clear and active. This tool measures how well you are filling your lungs with each breath. Taking long deep breaths may help reverse or decrease the chance of developing breathing (pulmonary) problems (especially infection) following:  A long period of time when you are  unable to move or be active. BEFORE THE PROCEDURE   If the spirometer includes an indicator to show your best effort, your nurse or respiratory therapist will set it to a desired goal.  If possible, sit up straight or lean slightly forward. Try not to slouch.  Hold the incentive spirometer in an upright position. INSTRUCTIONS FOR USE  3. Sit on the edge of your bed if possible, or sit up as far as you can in bed or on a chair. 4. Hold the incentive spirometer in an upright position. 5. Breathe out normally. 6. Place the mouthpiece in your mouth and seal your lips tightly around it. 7. Breathe in slowly and as deeply as possible, raising the piston or the ball toward the top of the column. 8. Hold your breath for 3-5 seconds or for as long as possible. Allow the piston or ball to fall to the bottom of the column. 9. Remove the mouthpiece from your mouth and breathe out normally. 10. Rest for a few seconds and repeat Steps 1 through 7 at least 10 times every 1-2 hours when you are awake. Take your time and take a few normal breaths between deep breaths. 11. The spirometer may include an indicator to show your best effort. Use the indicator as a goal to work toward during each repetition. 12. After each set of 10 deep breaths, practice coughing to be sure your lungs are clear. If you have an incision (the cut made at the time of surgery), support your incision when coughing by placing a pillow or rolled up towels firmly against it. Once you are able to get out of bed, walk around indoors and cough well. You may stop using the incentive spirometer when instructed by your caregiver.  RISKS AND COMPLICATIONS  Take your time so you do not get dizzy or light-headed.  If you are in pain, you  may need to take or ask for pain medication before doing incentive spirometry. It is harder to take a deep breath if you are having pain. AFTER USE  Rest and breathe slowly and easily.  It can be helpful to  keep track of a log of your progress. Your caregiver can provide you with a simple table to help with this. If you are using the spirometer at home, follow these instructions: [place] IF:   You are having difficultly using the spirometer.  You have trouble using the spirometer as often as instructed.  Your pain medication is not giving enough relief while using the spirometer.  You develop fever of 100.5 F (38.1 C) or higher. SEEK IMMEDIATE MEDICAL CARE IF:   You cough up bloody sputum that had not been present before.  You develop fever of 102 F (38.9 C) or greater.  You develop worsening pain at or near the incision site. MAKE SURE YOU:   Understand these instructions.  Will watch your condition.  Will get help right away if you are not doing well or get worse. Document Released: 06/11/2006 Document Revised: 04/23/2011 Document Reviewed: 08/12/2006 [facility] Patient Information 2014 [place], [place].   ________________________________________________________________________

## 2014-10-28 NOTE — Progress Notes (Signed)
Abnormal UA faxed to Dr.Aluisio 

## 2014-11-02 ENCOUNTER — Ambulatory Visit: Payer: Self-pay | Admitting: Orthopedic Surgery

## 2014-11-02 NOTE — H&P (Signed)
[name] DOB: 08/16/1929 Widowed / Language: English / Race: White Female Date of Admission:  11/03/2014 CC:  Left Hip Pain History of Present Illness The patient is a 81 year old female who comes in for a preoperative History and Physical. The patient is scheduled for a left total hip arthroplasty (anterior) to be performed by Dr. Dione Plover. Aluisio, MD at [facility] on 11/03/2014. The patient is a 81 year old female who presented for follow up of their hip. The patient is being followed for their left hip pain (and (L) groin pain) and osteoarthritis. Symptoms reported include: pain at night (having trouble sleeping. Pt reports having to get up multiple times a night to ambulate to get a little relief), stiffness, pain with weightbearing and difficulty ambulating (and difficulty standing). The patient feels that they are doing poorly and report their pain level to be severe. The following medication has been used for pain control: ibuprofen (qpm). The patient has reported symptom improvement with: activity modification, NSAIDs and rest while they have not gotten any relief of their symptoms with: Cortisone injections. The hip had gotten much worse over time. She recently saw Richardson Landry and it was noted that she had bone on bone arthritis, which had progressed significantly. Symptoms are much worse than they were. She is at a stage now where hips taken over her life. She is ready to proceed with surgery.  They have been treated conservatively in the past for the above stated problem and despite conservative measures, they continue to have progressive pain and severe functional limitations and dysfunction. They have failed non-operative management including home exercise, medications. It is felt that they would benefit from undergoing total joint replacement. Risks and benefits of the procedure have been discussed with the patient and they elect to proceed with surgery. There are no active  contraindications to surgery such as ongoing infection or rapidly progressive neurological disease.  Problem List/Past Medical Primary osteoarthritis of left hip (M16.12) Osteoarthritis, Shoulder (715.91) High blood pressure Hypercholesterolemia Breast Cancer Left-sided Diabetes Mellitus, Type II Shingles Vertigo  Allergies No Known Drug Allergies  Family History Diabetes Mellitus brother Cancer brother Heart disease in female family member before age 3 Osteoarthritis mother Heart Disease First Degree Relatives. mother and brother Cerebrovascular Accident mother and brother Depression sister  Social History Tobacco / smoke exposure no Pain Contract no Illicit drug use no Tobacco use Never smoker. never smoker Number of flights of stairs before winded 2-3 Marital status widowed Living situation live alone Current work status retired Engineer, agricultural (Currently) no Drug/Alcohol Rehab (Previously) no Children 2 Alcohol use never consumed alcohol Exercise Exercises rarely; does other  Medication History Lipitor (Oral) Specific dose unknown - Active. Aspirin (81MG  Tablet, Oral) Active. MetFORMIN HCl (Oral) Specific dose unknown - Active. Benazepril-Hydrochlorothiazide (Oral) Specific dose unknown - Active.  Past Surgical History Breast Biopsy left Total Knee Replacement bilateral; 2003, 2012 Gallbladder Surgery Date: 1975. open Breast Cancer Surgery - Left Side Date: 2001.  Review of Systems General Not Present- Chills, Fatigue, Fever, Memory Loss, Night Sweats, Weight Gain and Weight Loss. Skin Not Present- Eczema, Hives, Itching, Lesions and Rash. HEENT Not Present- Dentures, Double Vision, Headache, Hearing Loss, Tinnitus and Visual Loss. Respiratory Not Present- Allergies, Chronic Cough, Coughing up blood, Shortness of breath at rest and Shortness of breath with exertion. Cardiovascular Not Present- Chest Pain, Difficulty  Breathing Lying Down, Murmur, Palpitations, Racing/skipping heartbeats and Swelling. Gastrointestinal Not Present- Abdominal Pain, Bloody Stool, Constipation, Diarrhea, Difficulty Swallowing,  Heartburn, Jaundice, Loss of appetitie, Nausea and Vomiting. Female Genitourinary Not Present- Blood in Urine, Discharge, Flank Pain, Incontinence, Painful Urination, Urgency, Urinary frequency, Urinary Retention, Urinating at Night and Weak urinary stream. Musculoskeletal Present- Morning Stiffness and Muscle Pain. Not Present- Back Pain, Joint Pain, Joint Swelling, Muscle Weakness and Spasms. Neurological Not Present- Blackout spells, Difficulty with balance, Dizziness, Paralysis, Tremor and Weakness. Psychiatric Not Present- Insomnia.  Vitals  Weight: 160 lb Height: 64in Weight was reported by patient. Height was reported by patient. Body Surface Area: 1.78 m Body Mass Index: 27.46 kg/m  BP: 142/78 (Sitting, Right Arm, Standard)  Physical Exam General Mental Status -Alert, cooperative and good historian. General Appearance-pleasant, Not in acute distress. Orientation-Oriented X3. Build & Nutrition-Well nourished and Well developed.  Head and Neck Head-normocephalic, atraumatic . Neck Global Assessment - supple, no bruit auscultated on the right, no bruit auscultated on the left. Note: Upper dentures  Eye Vision-Wears corrective lenses. Pupil - Bilateral-Regular and Round. Motion - Bilateral-EOMI.  Chest and Lung Exam Auscultation Breath sounds - clear at anterior chest wall and clear at posterior chest wall. Adventitious sounds - No Adventitious sounds.  Cardiovascular Auscultation Rhythm - Regular rate and rhythm. Heart Sounds - S1 WNL and S2 WNL. Murmurs & Other Heart Sounds - Auscultation of the heart reveals - No Murmurs.  Abdomen Palpation/Percussion Tenderness - Abdomen is non-tender to palpation. Rigidity (guarding) - Abdomen is  soft. Auscultation Auscultation of the abdomen reveals - Bowel sounds normal.  Female Genitourinary Note: Not done, not pertinent to present illness   Musculoskeletal Note: On exam, she is alert and oriented, no apparent distress. Evaluation of her left hip, flexion 90, no internal rotation, about 10 to 20 of external rotation, 20 abduction. Right hip has normal range of motion.  RADIOGRAPHS Radiographs, show severe bone on bone arthritis of left hip with marginal osteophyte formation and subchondral cystic formation.  Assessment & Plan  Primary osteoarthritis of left hip (M16.12) Note:Surgical Plans: Left Total Hip Replacement - Anterior Approach  Disposition: Home if able to make arrangements  PCP: Dr. Hardin Negus - Patient has been seen preoperatively and felt to be stable for surgery. Cards: Dr. Sallyanne Kuster - "Low risk for major CV complications. Note very frequent asymptomatic PVC's that do not require therapy."  Topical TXA  Anesthesia Issues: NONE  Comments: AVOID LEFT ARM FOR IV'S AND BP'S  Signed electronically by Joelene Millin, III PA-C

## 2014-11-03 ENCOUNTER — Inpatient Hospital Stay (HOSPITAL_COMMUNITY): Payer: Medicare Other

## 2014-11-03 ENCOUNTER — Encounter (HOSPITAL_COMMUNITY)
Admission: RE | Disposition: A | Discharge status: Skilled Nursing Facility | Payer: Self-pay | Source: Ambulatory Visit | Attending: Orthopedic Surgery

## 2014-11-03 ENCOUNTER — Inpatient Hospital Stay (HOSPITAL_COMMUNITY): Payer: Medicare Other | Admitting: Anesthesiology

## 2014-11-03 ENCOUNTER — Inpatient Hospital Stay (HOSPITAL_COMMUNITY)
Admission: RE | Admit: 2014-11-03 | Discharge: 2014-11-05 | DRG: 470 | Disposition: A | Discharge status: Skilled Nursing Facility | Payer: Medicare Other | Source: Ambulatory Visit | Attending: Orthopedic Surgery | Admitting: Orthopedic Surgery

## 2014-11-03 ENCOUNTER — Encounter (HOSPITAL_COMMUNITY): Payer: Self-pay | Admitting: *Deleted

## 2014-11-03 DIAGNOSIS — Z8619 Personal history of other infectious and parasitic diseases: Secondary | ICD-10-CM | POA: Diagnosis not present

## 2014-11-03 DIAGNOSIS — Z853 Personal history of malignant neoplasm of breast: Secondary | ICD-10-CM | POA: Diagnosis not present

## 2014-11-03 DIAGNOSIS — Z79899 Other long term (current) drug therapy: Secondary | ICD-10-CM | POA: Diagnosis not present

## 2014-11-03 DIAGNOSIS — M6281 Muscle weakness (generalized): Secondary | ICD-10-CM | POA: Diagnosis not present

## 2014-11-03 DIAGNOSIS — Z471 Aftercare following joint replacement surgery: Secondary | ICD-10-CM | POA: Diagnosis not present

## 2014-11-03 DIAGNOSIS — M25552 Pain in left hip: Secondary | ICD-10-CM | POA: Diagnosis not present

## 2014-11-03 DIAGNOSIS — Z01812 Encounter for preprocedural laboratory examination: Secondary | ICD-10-CM

## 2014-11-03 DIAGNOSIS — E785 Hyperlipidemia, unspecified: Secondary | ICD-10-CM | POA: Diagnosis present

## 2014-11-03 DIAGNOSIS — I10 Essential (primary) hypertension: Secondary | ICD-10-CM | POA: Diagnosis not present

## 2014-11-03 DIAGNOSIS — I2510 Atherosclerotic heart disease of native coronary artery without angina pectoris: Secondary | ICD-10-CM | POA: Diagnosis present

## 2014-11-03 DIAGNOSIS — E119 Type 2 diabetes mellitus without complications: Secondary | ICD-10-CM | POA: Diagnosis present

## 2014-11-03 DIAGNOSIS — M169 Osteoarthritis of hip, unspecified: Secondary | ICD-10-CM | POA: Diagnosis present

## 2014-11-03 DIAGNOSIS — Z7901 Long term (current) use of anticoagulants: Secondary | ICD-10-CM | POA: Diagnosis not present

## 2014-11-03 DIAGNOSIS — M1612 Unilateral primary osteoarthritis, left hip: Principal | ICD-10-CM | POA: Diagnosis present

## 2014-11-03 DIAGNOSIS — Z7982 Long term (current) use of aspirin: Secondary | ICD-10-CM

## 2014-11-03 DIAGNOSIS — Z96642 Presence of left artificial hip joint: Secondary | ICD-10-CM | POA: Diagnosis not present

## 2014-11-03 DIAGNOSIS — Z96653 Presence of artificial knee joint, bilateral: Secondary | ICD-10-CM | POA: Diagnosis present

## 2014-11-03 DIAGNOSIS — R2689 Other abnormalities of gait and mobility: Secondary | ICD-10-CM | POA: Diagnosis not present

## 2014-11-03 DIAGNOSIS — Z96649 Presence of unspecified artificial hip joint: Secondary | ICD-10-CM

## 2014-11-03 HISTORY — PX: TOTAL HIP ARTHROPLASTY: SHX124

## 2014-11-03 LAB — GLUCOSE, CAPILLARY
GLUCOSE-CAPILLARY: 101 mg/dL — AB (ref 65–99)
Glucose-Capillary: 145 mg/dL — ABNORMAL HIGH (ref 65–99)
Glucose-Capillary: 154 mg/dL — ABNORMAL HIGH (ref 65–99)
Glucose-Capillary: 149 mg/dL — ABNORMAL HIGH (ref 65–99)

## 2014-11-03 LAB — TYPE AND SCREEN
ABO/RH(D): O POS
Antibody Screen: NEGATIVE

## 2014-11-03 SURGERY — ARTHROPLASTY, HIP, TOTAL, ANTERIOR APPROACH
Anesthesia: Spinal | Site: Hip | Laterality: Left

## 2014-11-03 MED ORDER — LACTATED RINGERS IV SOLN: INTRAVENOUS | Status: DC

## 2014-11-03 MED ORDER — PHENOL 1.4 % MT LIQD: 1.0000 | OROMUCOSAL | Status: DC | PRN

## 2014-11-03 MED ORDER — METHOCARBAMOL 500 MG PO TABS
500.0000 mg | ORAL_TABLET | Freq: Four times a day (QID) | ORAL | Status: DC | PRN
  Administered 2014-11-05: 500 mg via ORAL
  Filled 2014-11-03: qty 1

## 2014-11-03 MED ORDER — MENTHOL 3 MG MT LOZG: 1.0000 | LOZENGE | OROMUCOSAL | Status: DC | PRN

## 2014-11-03 MED ORDER — CEFAZOLIN SODIUM-DEXTROSE 2-3 GM-% IV SOLR
2.0000 g | INTRAVENOUS | Status: AC
  Administered 2014-11-03: 2 g via INTRAVENOUS

## 2014-11-03 MED ORDER — ACETAMINOPHEN 325 MG PO TABS
650.0000 mg | ORAL_TABLET | Freq: Four times a day (QID) | ORAL | Status: DC | PRN
  Administered 2014-11-04 – 2014-11-05 (×3): 650 mg via ORAL
  Filled 2014-11-03 (×3): qty 2

## 2014-11-03 MED ORDER — FENTANYL CITRATE (PF) 100 MCG/2ML IJ SOLN
INTRAMUSCULAR | Status: AC
  Filled 2014-11-03: qty 4

## 2014-11-03 MED ORDER — MECLIZINE HCL 25 MG PO TABS
25.0000 mg | ORAL_TABLET | Freq: Three times a day (TID) | ORAL | Status: DC | PRN
  Filled 2014-11-03: qty 1

## 2014-11-03 MED ORDER — CEFAZOLIN SODIUM-DEXTROSE 2-3 GM-% IV SOLR
2.0000 g | Freq: Four times a day (QID) | INTRAVENOUS | Status: AC
  Administered 2014-11-03 (×2): 2 g via INTRAVENOUS
  Filled 2014-11-03 (×2): qty 50

## 2014-11-03 MED ORDER — BUPIVACAINE HCL (PF) 0.25 % IJ SOLN
INTRAMUSCULAR | Status: AC
  Filled 2014-11-03: qty 30

## 2014-11-03 MED ORDER — ONDANSETRON HCL 4 MG/2ML IJ SOLN
INTRAMUSCULAR | Status: DC | PRN
  Administered 2014-11-03: 4 mg via INTRAVENOUS

## 2014-11-03 MED ORDER — PROPOFOL 10 MG/ML IV BOLUS
INTRAVENOUS | Status: DC | PRN
Start: 2014-11-03 — End: 2014-11-03
  Administered 2014-11-03 (×2): 5 mg via INTRAVENOUS
  Administered 2014-11-03 (×3): 10 mg via INTRAVENOUS

## 2014-11-03 MED ORDER — DIPHENHYDRAMINE HCL 12.5 MG/5ML PO ELIX: 12.5000 mg | ORAL_SOLUTION | ORAL | Status: DC | PRN

## 2014-11-03 MED ORDER — MORPHINE SULFATE (PF) 2 MG/ML IV SOLN
1.0000 mg | INTRAVENOUS | Status: DC | PRN
  Administered 2014-11-03: 1 mg via INTRAVENOUS
  Filled 2014-11-03 (×3): qty 1

## 2014-11-03 MED ORDER — ACETAMINOPHEN 10 MG/ML IV SOLN
INTRAVENOUS | Status: AC
  Filled 2014-11-03: qty 100

## 2014-11-03 MED ORDER — ACETAMINOPHEN 650 MG RE SUPP: 650.0000 mg | Freq: Four times a day (QID) | RECTAL | Status: DC | PRN

## 2014-11-03 MED ORDER — DEXAMETHASONE SODIUM PHOSPHATE 10 MG/ML IJ SOLN
10.0000 mg | Freq: Once | INTRAMUSCULAR | Status: AC
  Administered 2014-11-04: 10 mg via INTRAVENOUS
  Filled 2014-11-03: qty 1

## 2014-11-03 MED ORDER — SODIUM CHLORIDE 0.9 % IR SOLN
Status: DC | PRN
  Administered 2014-11-03: 1000 mL

## 2014-11-03 MED ORDER — MEPERIDINE HCL 50 MG/ML IJ SOLN: 6.2500 mg | INTRAMUSCULAR | Status: DC | PRN

## 2014-11-03 MED ORDER — PROPOFOL 10 MG/ML IV BOLUS
INTRAVENOUS | Status: AC
  Filled 2014-11-03: qty 20

## 2014-11-03 MED ORDER — LACTATED RINGERS IV SOLN
INTRAVENOUS | Status: DC | PRN
  Administered 2014-11-03: 10:00:00 via INTRAVENOUS

## 2014-11-03 MED ORDER — ONDANSETRON HCL 4 MG PO TABS: 4.0000 mg | ORAL_TABLET | Freq: Four times a day (QID) | ORAL | Status: DC | PRN

## 2014-11-03 MED ORDER — ACETAMINOPHEN 10 MG/ML IV SOLN
1000.0000 mg | Freq: Once | INTRAVENOUS | Status: AC
  Administered 2014-11-03: 1000 mg via INTRAVENOUS
  Filled 2014-11-03: qty 100

## 2014-11-03 MED ORDER — POLYETHYLENE GLYCOL 3350 17 G PO PACK: 17.0000 g | PACK | Freq: Every day | ORAL | Status: DC | PRN

## 2014-11-03 MED ORDER — ACETAMINOPHEN 500 MG PO TABS
1000.0000 mg | ORAL_TABLET | Freq: Four times a day (QID) | ORAL | Status: AC
  Administered 2014-11-03 – 2014-11-04 (×4): 1000 mg via ORAL
  Filled 2014-11-03 (×4): qty 2

## 2014-11-03 MED ORDER — ONDANSETRON HCL 4 MG/2ML IJ SOLN: 4.0000 mg | Freq: Four times a day (QID) | INTRAMUSCULAR | Status: DC | PRN

## 2014-11-03 MED ORDER — DOCUSATE SODIUM 100 MG PO CAPS
100.0000 mg | ORAL_CAPSULE | Freq: Two times a day (BID) | ORAL | Status: DC
  Administered 2014-11-03 – 2014-11-05 (×4): 100 mg via ORAL

## 2014-11-03 MED ORDER — ATORVASTATIN CALCIUM 10 MG PO TABS
10.0000 mg | ORAL_TABLET | Freq: Every day | ORAL | Status: DC
  Administered 2014-11-04 – 2014-11-05 (×2): 10 mg via ORAL
  Filled 2014-11-03 (×2): qty 1

## 2014-11-03 MED ORDER — APIXABAN 2.5 MG PO TABS
2.5000 mg | ORAL_TABLET | Freq: Two times a day (BID) | ORAL | Status: DC
  Administered 2014-11-04 – 2014-11-05 (×3): 2.5 mg via ORAL
  Filled 2014-11-03 (×5): qty 1

## 2014-11-03 MED ORDER — CEFAZOLIN SODIUM-DEXTROSE 2-3 GM-% IV SOLR
INTRAVENOUS | Status: AC
  Filled 2014-11-03: qty 50

## 2014-11-03 MED ORDER — TRANEXAMIC ACID 1000 MG/10ML IV SOLN
2000.0000 mg | INTRAVENOUS | Status: DC | PRN
  Administered 2014-11-03: 2000 mg via INTRAVENOUS

## 2014-11-03 MED ORDER — BUPIVACAINE HCL (PF) 0.75 % IJ SOLN
INTRAMUSCULAR | Status: DC | PRN
  Administered 2014-11-03: 15 mg

## 2014-11-03 MED ORDER — LACTATED RINGERS IV SOLN
INTRAVENOUS | Status: DC | PRN
  Administered 2014-11-03 (×2): via INTRAVENOUS

## 2014-11-03 MED ORDER — METOCLOPRAMIDE HCL 10 MG PO TABS: 5.0000 mg | ORAL_TABLET | Freq: Three times a day (TID) | ORAL | Status: DC | PRN

## 2014-11-03 MED ORDER — 0.9 % SODIUM CHLORIDE (POUR BTL) OPTIME
TOPICAL | Status: DC | PRN
  Administered 2014-11-03: 1000 mL

## 2014-11-03 MED ORDER — LIDOCAINE HCL (CARDIAC) 20 MG/ML IV SOLN
INTRAVENOUS | Status: AC
  Filled 2014-11-03: qty 5

## 2014-11-03 MED ORDER — FLEET ENEMA 7-19 GM/118ML RE ENEM: 1.0000 | ENEMA | Freq: Once | RECTAL | Status: DC | PRN

## 2014-11-03 MED ORDER — DEXTROSE 5 % IV SOLN
500.0000 mg | Freq: Four times a day (QID) | INTRAVENOUS | Status: DC | PRN
  Filled 2014-11-03: qty 5

## 2014-11-03 MED ORDER — FENTANYL CITRATE (PF) 100 MCG/2ML IJ SOLN: 25.0000 ug | INTRAMUSCULAR | Status: DC | PRN

## 2014-11-03 MED ORDER — CHLORHEXIDINE GLUCONATE 4 % EX LIQD: 60.0000 mL | Freq: Once | CUTANEOUS | Status: DC

## 2014-11-03 MED ORDER — METOCLOPRAMIDE HCL 5 MG/ML IJ SOLN: 5.0000 mg | Freq: Three times a day (TID) | INTRAMUSCULAR | Status: DC | PRN

## 2014-11-03 MED ORDER — SODIUM CHLORIDE 0.9 % IV SOLN
INTRAVENOUS | Status: DC
  Administered 2014-11-03: 14:00:00 via INTRAVENOUS

## 2014-11-03 MED ORDER — DEXAMETHASONE SODIUM PHOSPHATE 10 MG/ML IJ SOLN
INTRAMUSCULAR | Status: AC
  Filled 2014-11-03: qty 1

## 2014-11-03 MED ORDER — DEXAMETHASONE SODIUM PHOSPHATE 10 MG/ML IJ SOLN
10.0000 mg | Freq: Once | INTRAMUSCULAR | Status: AC
  Administered 2014-11-03: 10 mg via INTRAVENOUS

## 2014-11-03 MED ORDER — PHENYLEPHRINE HCL 10 MG/ML IJ SOLN
INTRAMUSCULAR | Status: DC | PRN
  Administered 2014-11-03 (×2): 80 ug via INTRAVENOUS
  Administered 2014-11-03: 40 ug via INTRAVENOUS
  Administered 2014-11-03: 80 ug via INTRAVENOUS
  Administered 2014-11-03: 40 ug via INTRAVENOUS

## 2014-11-03 MED ORDER — DOCUSATE SODIUM 100 MG PO CAPS: 100.0000 mg | ORAL_CAPSULE | Freq: Two times a day (BID) | ORAL | Status: DC

## 2014-11-03 MED ORDER — PROMETHAZINE HCL 25 MG/ML IJ SOLN: 6.2500 mg | INTRAMUSCULAR | Status: DC | PRN

## 2014-11-03 MED ORDER — METFORMIN HCL 850 MG PO TABS
850.0000 mg | ORAL_TABLET | Freq: Two times a day (BID) | ORAL | Status: DC
  Administered 2014-11-04 – 2014-11-05 (×3): 850 mg via ORAL
  Filled 2014-11-03 (×5): qty 1

## 2014-11-03 MED ORDER — INSULIN ASPART 100 UNIT/ML ~~LOC~~ SOLN
0.0000 [IU] | Freq: Three times a day (TID) | SUBCUTANEOUS | Status: DC
  Administered 2014-11-03: 3 [IU] via SUBCUTANEOUS
  Administered 2014-11-04: 2 [IU] via SUBCUTANEOUS
  Administered 2014-11-04: 3 [IU] via SUBCUTANEOUS

## 2014-11-03 MED ORDER — FENTANYL CITRATE (PF) 100 MCG/2ML IJ SOLN
INTRAMUSCULAR | Status: DC | PRN
  Administered 2014-11-03: 50 ug via INTRAVENOUS
  Administered 2014-11-03 (×2): 25 ug via INTRAVENOUS

## 2014-11-03 MED ORDER — BISACODYL 10 MG RE SUPP: 10.0000 mg | Freq: Every day | RECTAL | Status: DC | PRN

## 2014-11-03 MED ORDER — SODIUM CHLORIDE 0.9 % IV SOLN: INTRAVENOUS | Status: DC

## 2014-11-03 MED ORDER — PROPOFOL INFUSION 10 MG/ML OPTIME
INTRAVENOUS | Status: DC | PRN
  Administered 2014-11-03: 50 ug/kg/min via INTRAVENOUS

## 2014-11-03 MED ORDER — OXYCODONE HCL 5 MG PO TABS
5.0000 mg | ORAL_TABLET | ORAL | Status: DC | PRN
Start: 2014-11-03 — End: 2014-11-05
  Administered 2014-11-03: 5 mg via ORAL
  Administered 2014-11-03 – 2014-11-04 (×2): 10 mg via ORAL
  Filled 2014-11-03 (×3): qty 2
  Filled 2014-11-03: qty 1

## 2014-11-03 MED ORDER — TRAMADOL HCL 50 MG PO TABS: 50.0000 mg | ORAL_TABLET | Freq: Four times a day (QID) | ORAL | Status: DC | PRN

## 2014-11-03 MED ORDER — TRANEXAMIC ACID 1000 MG/10ML IV SOLN
2000.0000 mg | Freq: Once | INTRAVENOUS | Status: DC
  Filled 2014-11-03: qty 20

## 2014-11-03 SURGICAL SUPPLY — 47 items
BAG DECANTER FOR FLEXI CONT (MISCELLANEOUS) ×2 IMPLANT
BAG SPEC THK2 15X12 ZIP CLS (MISCELLANEOUS)
BAG ZIPLOCK 12X15 (MISCELLANEOUS) IMPLANT
BLADE EXTENDED COATED 6.5IN (ELECTRODE) ×2 IMPLANT
BLADE SAG 18X100X1.27 (BLADE) ×2 IMPLANT
CAPT HIP TOTAL 2 ×1 IMPLANT
COVER PERINEAL POST (MISCELLANEOUS) ×2 IMPLANT
DECANTER SPIKE VIAL GLASS SM (MISCELLANEOUS) ×2 IMPLANT
DRAPE C-ARM 42X120 X-RAY (DRAPES) ×2 IMPLANT
DRAPE STERI IOBAN 125X83 (DRAPES) ×2 IMPLANT
DRAPE U-SHAPE 47X51 STRL (DRAPES) ×6 IMPLANT
DRSG ADAPTIC 3X8 NADH LF (GAUZE/BANDAGES/DRESSINGS) ×2 IMPLANT
DRSG AQUACEL AG ADV 3.5X 4 (GAUZE/BANDAGES/DRESSINGS) ×1 IMPLANT
DRSG AQUACEL AG ADV 3.5X 6 (GAUZE/BANDAGES/DRESSINGS) ×1 IMPLANT
DRSG EMULSION OIL 3X16 NADH (GAUZE/BANDAGES/DRESSINGS) ×1 IMPLANT
DRSG MEPILEX BORDER 4X4 (GAUZE/BANDAGES/DRESSINGS) ×2 IMPLANT
DRSG MEPILEX BORDER 4X8 (GAUZE/BANDAGES/DRESSINGS) ×2 IMPLANT
DURAPREP 26ML APPLICATOR (WOUND CARE) ×2 IMPLANT
ELECT REM PT RETURN 9FT ADLT (ELECTROSURGICAL) ×2
ELECTRODE REM PT RTRN 9FT ADLT (ELECTROSURGICAL) ×1 IMPLANT
EVACUATOR 1/8 PVC DRAIN (DRAIN) ×2 IMPLANT
FACESHIELD WRAPAROUND (MASK) ×8 IMPLANT
FACESHIELD WRAPAROUND OR TEAM (MASK) ×4 IMPLANT
GLOVE BIO SURGEON STRL SZ7.5 (GLOVE) ×2 IMPLANT
GLOVE BIO SURGEON STRL SZ8 (GLOVE) ×4 IMPLANT
GLOVE BIOGEL PI IND STRL 8 (GLOVE) ×2 IMPLANT
GLOVE BIOGEL PI INDICATOR 8 (GLOVE) ×2
GOWN STRL REUS W/TWL LRG LVL3 (GOWN DISPOSABLE) ×2 IMPLANT
GOWN STRL REUS W/TWL XL LVL3 (GOWN DISPOSABLE) ×2 IMPLANT
KIT BASIN OR (CUSTOM PROCEDURE TRAY) ×2 IMPLANT
NDL SAFETY ECLIPSE 18X1.5 (NEEDLE) ×1 IMPLANT
NEEDLE HYPO 18GX1.5 SHARP (NEEDLE) ×2
PACK TOTAL JOINT (CUSTOM PROCEDURE TRAY) ×2 IMPLANT
PEN SKIN MARKING BROAD (MISCELLANEOUS) ×2 IMPLANT
STRIP CLOSURE SKIN 1/2X4 (GAUZE/BANDAGES/DRESSINGS) ×2 IMPLANT
SUT ETHIBOND NAB CT1 #1 30IN (SUTURE) ×2 IMPLANT
SUT MNCRL AB 4-0 PS2 18 (SUTURE) ×2 IMPLANT
SUT VIC AB 2-0 CT1 27 (SUTURE) ×4
SUT VIC AB 2-0 CT1 TAPERPNT 27 (SUTURE) ×2 IMPLANT
SUT VLOC 180 0 24IN GS25 (SUTURE) ×2 IMPLANT
SYR 30ML LL (SYRINGE) ×2 IMPLANT
SYR 50ML LL SCALE MARK (SYRINGE) ×1 IMPLANT
TOWEL OR 17X26 10 PK STRL BLUE (TOWEL DISPOSABLE) ×2 IMPLANT
TOWEL OR NON WOVEN STRL DISP B (DISPOSABLE) ×1 IMPLANT
TRAY FOLEY W/METER SILVER 14FR (SET/KITS/TRAYS/PACK) ×2 IMPLANT
TRAY FOLEY W/METER SILVER 16FR (SET/KITS/TRAYS/PACK) ×2 IMPLANT
YANKAUER SUCT BULB TIP 10FT TU (MISCELLANEOUS) ×1 IMPLANT

## 2014-11-03 NOTE — Anesthesia Postprocedure Evaluation (Signed)
  Anesthesia Post-op Note  Patient: [name]  Procedure(s) Performed: Procedure(s) (LRB): LEFT TOTAL HIP ARTHROPLASTY ANTERIOR APPROACH (Left)  Patient Location: PACU  Anesthesia Type: Spinal  Level of Consciousness: awake and alert   Airway and Oxygen Therapy: Patient Spontanous Breathing  Post-op Pain: mild  Post-op Assessment: Post-op Vital signs reviewed, Patient's Cardiovascular Status Stable, Respiratory Function Stable, Patent Airway and No signs of Nausea or vomiting  Last Vitals:  Filed Vitals:   11/03/14 1330  BP: 151/64  Pulse: 80  Temp: 37.4 C  Resp: 14    Post-op Vital Signs: stable   Complications: No apparent anesthesia complications

## 2014-11-03 NOTE — Evaluation (Signed)
Physical Therapy Evaluation Patient Details Name: [name] MRN: [id number] DOB: 1929/05/14 Today's Date: 11/03/2014   History of Present Illness  81 yo female s/p L THA 11/03/14  Clinical Impression  On eval, pt required Min assist for mobility-walked ~5 feet with RW. Pain rated 9/10 with activity. Discussed d/c plan-pt does not have any assistance set up for during the day. Daughter states she can help at night. Recommend HHPT vs SNF, depending on progress.     Follow Up Recommendations SNF vs Home health PT with 24 hour supervision/assist (depending on progress. Pt does not have assistance during the day. )    Equipment Recommendations       Recommendations for Other Services       Precautions / Restrictions Precautions Precautions: Fall Restrictions Weight Bearing Restrictions: No LLE Weight Bearing: Weight bearing as tolerated      Mobility  Bed Mobility Overal bed mobility: Needs Assistance Bed Mobility: Supine to Sit     Supine to sit: Min assist     General bed mobility comments: Assist for L LE. Increased time and effort to scoot to EOB  Transfers Overall transfer level: Needs assistance Equipment used: Rolling walker (2 wheeled) Transfers: Sit to/from Stand Sit to Stand: Min assist         General transfer comment: assist to rise, stabilize, control descent. VCs safety, technique, hand placement. Stood at [facility] for a few minutes before attempting steps.  Ambulation/Gait Ambulation/Gait assistance: Min assist Ambulation Distance (Feet): 5 Feet Assistive device: Rolling walker (2 wheeled) Gait Pattern/deviations: Step-to pattern;Antalgic     General Gait Details: difficulty advancing L LE. VCs safety, technique, sequence. Assist to stabilize pt and maneuver with RW. Increased time. Followed closely with recliner for safety  Stairs            Wheelchair Mobility    Modified Rankin (Stroke Patients Only)       Balance                                              Pertinent Vitals/Pain Pain Assessment: 0-10 Pain Score: 9  Pain Location: 9/10 with activity; 6/10 at rest Pain Descriptors / Indicators: Aching;Sore Pain Intervention(s): Monitored during session;Repositioned;Patient requesting pain meds-RN notified    Home Living Family/patient expects to be discharged to:: Unsure Living Arrangements: Alone Available Help at Discharge: Family (available at night) Type of Home: House Home Access: Stairs to enter Entrance Stairs-Rails: Right Entrance Stairs-Number of Steps: 4 Home Layout: One level Home Equipment: Walker - 2 wheels      Prior Function Level of Independence: Independent               Hand Dominance        Extremity/Trunk Assessment   Upper Extremity Assessment: Defer to OT evaluation           Lower Extremity Assessment: LLE deficits/detail   LLE Deficits / Details: hip flex 2/5-limited by pain, moves ankle well  Cervical / Trunk Assessment: Normal  Communication   Communication: No difficulties  Cognition Arousal/Alertness: Awake/alert Behavior During Therapy: WFL for tasks assessed/performed Overall Cognitive Status: Within Functional Limits for tasks assessed                      General Comments      Exercises        Assessment/Plan  PT Assessment Patient needs continued PT services  PT Diagnosis Difficulty walking;Acute pain;Generalized weakness   PT Problem List Decreased strength;Decreased range of motion;Decreased activity tolerance;Decreased balance;Decreased mobility;Decreased knowledge of use of DME;Pain  PT Treatment Interventions DME instruction;Gait training;Functional mobility training;Therapeutic activities;Patient/family education;Balance training;Therapeutic exercise   PT Goals (Current goals can be found in the Care Plan section) Acute Rehab PT Goals Patient Stated Goal: Less pain. regain independence PT Goal  Formulation: With patient Time For Goal Achievement: 11/10/14 Potential to Achieve Goals: Good    Frequency 7X/week   Barriers to discharge        Co-evaluation               End of Session Equipment Utilized During Treatment: Gait belt Activity Tolerance: Patient limited by fatigue;Patient limited by pain Patient left: in chair;with call bell/phone within reach           Time: 1425-1441 PT Time Calculation (min) (ACUTE ONLY): 16 min   Charges:   PT Evaluation $Initial PT Evaluation Tier I: 1 Procedure     PT G Codes:        [place]on Anna, MPT Pager: [phone]

## 2014-11-03 NOTE — Anesthesia Preprocedure Evaluation (Addendum)
Anesthesia Evaluation  Patient identified by MRN, date of birth, ID band Patient awake    Reviewed: Allergy & Precautions, NPO status , Patient's Chart, lab work & pertinent test results  Airway Mallampati: II  TM Distance: >3 FB Neck ROM: Full    Dental no notable dental hx. (+) Edentulous Upper   Pulmonary neg pulmonary ROS,    Pulmonary exam normal breath sounds clear to auscultation       Cardiovascular hypertension, Pt. on medications + CAD (50% LAD on cath 2011. medical management)  Normal cardiovascular exam Rhythm:Regular Rate:Normal     Neuro/Psych negative neurological ROS  negative psych ROS   GI/Hepatic negative GI ROS, Neg liver ROS,   Endo/Other  diabetes, Type 2, Oral Hypoglycemic Agents  Renal/GU negative Renal ROS  negative genitourinary   Musculoskeletal negative musculoskeletal ROS (+)   Abdominal   Peds negative pediatric ROS (+)  Hematology negative hematology ROS (+)   Anesthesia Other Findings   Reproductive/Obstetrics negative OB ROS                           Anesthesia Physical Anesthesia Plan  ASA: III  Anesthesia Plan: Spinal   Post-op Pain Management:    Induction:   Airway Management Planned: Simple Face Mask  Additional Equipment:   Intra-op Plan:   Post-operative Plan:   Informed Consent: I have reviewed the patients History and Physical, chart, labs and discussed the procedure including the risks, benefits and alternatives for the proposed anesthesia with the patient or authorized representative who has indicated his/her understanding and acceptance.   Dental advisory given  Plan Discussed with: CRNA  Anesthesia Plan Comments:         Anesthesia Quick Evaluation

## 2014-11-03 NOTE — Anesthesia Procedure Notes (Signed)
Spinal  Start time: 11/03/2014 8:39 AM End time: 11/03/2014 8:44 AM Staffing Resident/CRNA: Sherian Maroon A Performed by: resident/CRNA  Preanesthetic Checklist Completed: patient identified, site marked, surgical consent, pre-op evaluation, timeout performed, IV checked, risks and benefits discussed and monitors and equipment checked Spinal Block Patient position: sitting Prep: Betadine and site prepped and draped Patient monitoring: cardiac monitor, heart rate, continuous pulse ox and blood pressure Approach: midline Location: L3-4 Injection technique: single-shot Needle Needle type: Spinocan  Needle gauge: 22 G Needle length: 9 cm Needle insertion depth: 5 cm Assessment Sensory level: T6

## 2014-11-03 NOTE — Op Note (Signed)
OPERATIVE REPORT  PREOPERATIVE DIAGNOSIS: Osteoarthritis of the Left hip.   POSTOPERATIVE DIAGNOSIS: Osteoarthritis of the Left  hip.   PROCEDURE: Left total hip arthroplasty, anterior approach.   SURGEON: Gaynelle Arabian, MD   ASSISTANT: Molli Barrows, PA-C  ANESTHESIA:  Spinal  ESTIMATED BLOOD LOSS:- 250 ml  DRAINS: Hemovac x1.   COMPLICATIONS: None   CONDITION: Stable- PACU  BRIEF CLINICAL NOTE: [name] is a 81 y.o. female who has advanced end-  stage arthritis of her Left  hip with progressively worsening pain and  dysfunction.The patient has failed nonoperative management and presents for  total hip arthroplasty.   PROCEDURE IN DETAIL: After successful administration of spinal  anesthetic, the traction boots for the [facility] bed were placed on both  feet and the patient was placed onto the [facility] bed, boots placed into the leg  holders. The Left hip was then isolated from the perineum with plastic  drapes and prepped and draped in the usual sterile fashion. ASIS and  greater trochanter were marked and a oblique incision was made, starting  at about 1 cm lateral and 2 cm distal to the ASIS and coursing towards  the anterior cortex of the femur. The skin was cut with a 10 blade  through subcutaneous tissue to the level of the fascia overlying the  tensor fascia lata muscle. The fascia was then incised in line with the  incision at the junction of the anterior third and posterior 2/3rd. The  muscle was teased off the fascia and then the interval between the TFL  and the rectus was developed. The Hohmann retractor was then placed at  the top of the femoral neck over the capsule. The vessels overlying the  capsule were cauterized and the fat on top of the capsule was removed.  A Hohmann retractor was then placed anterior underneath the rectus  femoris to give exposure to the entire anterior capsule. A T-shaped  capsulotomy was performed. The edges were tagged  and the femoral head  was identified.       Osteophytes are removed off the superior acetabulum.  The femoral neck was then cut in situ with an oscillating saw. Traction  was then applied to the left lower extremity utilizing the [facility]  traction. The femoral head was then removed. Retractors were placed  around the acetabulum and then circumferential removal of the labrum was  performed. Osteophytes were also removed. Reaming starts at 45 mm to  medialize and  Increased in 2 mm increments to 49 mm. We reamed in  approximately 40 degrees of abduction, 20 degrees anteversion. A 50 mm  pinnacle acetabular shell was then impacted in anatomic position under  fluoroscopic guidance with excellent purchase. We did not need to place  any additional dome screws. A 32 mm neutral + 4 marathon liner was then  placed into the acetabular shell.       The femoral lift was then placed along the lateral aspect of the femur  just distal to the vastus ridge. The leg was  externally rotated and capsule  was stripped off the inferior aspect of the femoral neck down to the  level of the lesser trochanter, this was done with electrocautery. The femur was lifted after this was performed. The  leg was then placed and extended in adducted position to essentially delivering the femur. We also removed the capsule superiorly and the  piriformis from the piriformis fossa to gain  excellent exposure of the  proximal femur. Rongeur was used to remove some cancellous bone to get  into the lateral portion of the proximal femur for placement of the  initial starter reamer. The starter broaches was placed  the starter broach  and was shown to go down the center of the canal. Broaching  with the  Corail system was then performed starting at size 8, coursing  Up to size 12. A size 12 had excellent torsional and rotational  and axial stability. The trial standard offset neck was then placed  with a 32 + 5 trial head. The hip was  then reduced. We confirmed that  the stem was in the canal both on AP and lateral x-rays. It also has excellent sizing. The hip was reduced with outstanding stability through full extension, full external rotation,  and then flexion in adduction internal rotation. AP pelvis was taken  and the leg lengths were measured and found to be exactly equal. Hip  was then dislocated again and the femoral head and neck removed. The  femoral broach was removed. Size 12 Corail stem with a standard offset  neck was then impacted into the femur following native anteversion. Has  excellent purchase in the canal. Excellent torsional and rotational and  axial stability. It is confirmed to be in the canal on AP and lateral  fluoroscopic views. The 32 + 5 ceramic head was placed and the hip  reduced with outstanding stability. Again AP pelvis was taken and it  confirmed that the leg lengths were equal. The wound was then copiously  irrigated with saline solution and the capsule reattached and repaired  with Ethibond suture. 30 ml of .25% Bupivicaine injected into the capsule and into the edge of the tensor fascia lata as well as subcutaneous tissue. The fascia overlying the tensor fascia lata was  then closed with a running #1 V-Loc. Subcu was closed with interrupted  2-0 Vicryl and subcuticular running 4-0 Monocryl. Incision was cleaned  and dried. Steri-Strips and a bulky sterile dressing applied. Hemovac  drain was hooked to suction and then he was awakened and transported to  recovery in stable condition.        Please note that a surgical assistant was a medical necessity for this procedure to perform it in a safe and expeditious manner. Assistant was necessary to provide appropriate retraction of vital neurovascular structures and to prevent femoral fracture and allow for anatomic placement of the prosthesis.  Gaynelle Arabian, M.D.

## 2014-11-03 NOTE — Transfer of Care (Signed)
Immediate Anesthesia Transfer of Care Note  Patient: [name]  Procedure(s) Performed: Procedure(s): LEFT TOTAL HIP ARTHROPLASTY ANTERIOR APPROACH (Left)  Patient Location: PACU  Anesthesia Type:Spinal  Level of Consciousness: awake, alert , oriented and patient cooperative  Airway & Oxygen Therapy: Patient Spontanous Breathing and Patient connected to face mask oxygen  Post-op Assessment: Report given to RN and Post -op Vital signs reviewed and stable  Post vital signs: Reviewed and stable  Last Vitals:  Filed Vitals:   11/03/14 1015  BP:   Pulse:   Temp: 36.3 C  Resp: 16    Complications: No apparent anesthesia complications

## 2014-11-03 NOTE — H&P (View-Only) (Signed)
[name] DOB: Jun 29, 1929 Widowed / Language: English / Race: White Female Date of Admission:  11/03/2014 CC:  Left Hip Pain History of Present Illness The patient is a 81 year old female who comes in for a preoperative History and Physical. The patient is scheduled for a left total hip arthroplasty (anterior) to be performed by Dr. Dione Plover. Aluisio, MD at [facility] on 11/03/2014. The patient is a 81 year old female who presented for follow up of their hip. The patient is being followed for their left hip pain (and (L) groin pain) and osteoarthritis. Symptoms reported include: pain at night (having trouble sleeping. Pt reports having to get up multiple times a night to ambulate to get a little relief), stiffness, pain with weightbearing and difficulty ambulating (and difficulty standing). The patient feels that they are doing poorly and report their pain level to be severe. The following medication has been used for pain control: ibuprofen (qpm). The patient has reported symptom improvement with: activity modification, NSAIDs and rest while they have not gotten any relief of their symptoms with: Cortisone injections. The hip had gotten much worse over time. She recently saw Richardson Landry and it was noted that she had bone on bone arthritis, which had progressed significantly. Symptoms are much worse than they were. She is at a stage now where hips taken over her life. She is ready to proceed with surgery.  They have been treated conservatively in the past for the above stated problem and despite conservative measures, they continue to have progressive pain and severe functional limitations and dysfunction. They have failed non-operative management including home exercise, medications. It is felt that they would benefit from undergoing total joint replacement. Risks and benefits of the procedure have been discussed with the patient and they elect to proceed with surgery. There are no active  contraindications to surgery such as ongoing infection or rapidly progressive neurological disease.  Problem List/Past Medical Primary osteoarthritis of left hip (M16.12) Osteoarthritis, Shoulder (715.91) High blood pressure Hypercholesterolemia Breast Cancer Left-sided Diabetes Mellitus, Type II Shingles Vertigo  Allergies No Known Drug Allergies  Family History Diabetes Mellitus brother Cancer brother Heart disease in female family member before age 51 Osteoarthritis mother Heart Disease First Degree Relatives. mother and brother Cerebrovascular Accident mother and brother Depression sister  Social History Tobacco / smoke exposure no Pain Contract no Illicit drug use no Tobacco use Never smoker. never smoker Number of flights of stairs before winded 2-3 Marital status widowed Living situation live alone Current work status retired Engineer, agricultural (Currently) no Drug/Alcohol Rehab (Previously) no Children 2 Alcohol use never consumed alcohol Exercise Exercises rarely; does other  Medication History Lipitor (Oral) Specific dose unknown - Active. Aspirin (81MG  Tablet, Oral) Active. MetFORMIN HCl (Oral) Specific dose unknown - Active. Benazepril-Hydrochlorothiazide (Oral) Specific dose unknown - Active.  Past Surgical History Breast Biopsy left Total Knee Replacement bilateral; 2003, 2012 Gallbladder Surgery Date: 1975. open Breast Cancer Surgery - Left Side Date: 2001.  Review of Systems General Not Present- Chills, Fatigue, Fever, Memory Loss, Night Sweats, Weight Gain and Weight Loss. Skin Not Present- Eczema, Hives, Itching, Lesions and Rash. HEENT Not Present- Dentures, Double Vision, Headache, Hearing Loss, Tinnitus and Visual Loss. Respiratory Not Present- Allergies, Chronic Cough, Coughing up blood, Shortness of breath at rest and Shortness of breath with exertion. Cardiovascular Not Present- Chest Pain, Difficulty  Breathing Lying Down, Murmur, Palpitations, Racing/skipping heartbeats and Swelling. Gastrointestinal Not Present- Abdominal Pain, Bloody Stool, Constipation, Diarrhea, Difficulty Swallowing,  Heartburn, Jaundice, Loss of appetitie, Nausea and Vomiting. Female Genitourinary Not Present- Blood in Urine, Discharge, Flank Pain, Incontinence, Painful Urination, Urgency, Urinary frequency, Urinary Retention, Urinating at Night and Weak urinary stream. Musculoskeletal Present- Morning Stiffness and Muscle Pain. Not Present- Back Pain, Joint Pain, Joint Swelling, Muscle Weakness and Spasms. Neurological Not Present- Blackout spells, Difficulty with balance, Dizziness, Paralysis, Tremor and Weakness. Psychiatric Not Present- Insomnia.  Vitals  Weight: 160 lb Height: 64in Weight was reported by patient. Height was reported by patient. Body Surface Area: 1.78 m Body Mass Index: 27.46 kg/m  BP: 142/78 (Sitting, Right Arm, Standard)  Physical Exam General Mental Status -Alert, cooperative and good historian. General Appearance-pleasant, Not in acute distress. Orientation-Oriented X3. Build & Nutrition-Well nourished and Well developed.  Head and Neck Head-normocephalic, atraumatic . Neck Global Assessment - supple, no bruit auscultated on the right, no bruit auscultated on the left. Note: Upper dentures  Eye Vision-Wears corrective lenses. Pupil - Bilateral-Regular and Round. Motion - Bilateral-EOMI.  Chest and Lung Exam Auscultation Breath sounds - clear at anterior chest wall and clear at posterior chest wall. Adventitious sounds - No Adventitious sounds.  Cardiovascular Auscultation Rhythm - Regular rate and rhythm. Heart Sounds - S1 WNL and S2 WNL. Murmurs & Other Heart Sounds - Auscultation of the heart reveals - No Murmurs.  Abdomen Palpation/Percussion Tenderness - Abdomen is non-tender to palpation. Rigidity (guarding) - Abdomen is  soft. Auscultation Auscultation of the abdomen reveals - Bowel sounds normal.  Female Genitourinary Note: Not done, not pertinent to present illness   Musculoskeletal Note: On exam, she is alert and oriented, no apparent distress. Evaluation of her left hip, flexion 90, no internal rotation, about 10 to 20 of external rotation, 20 abduction. Right hip has normal range of motion.  RADIOGRAPHS Radiographs, show severe bone on bone arthritis of left hip with marginal osteophyte formation and subchondral cystic formation.  Assessment & Plan  Primary osteoarthritis of left hip (M16.12) Note:Surgical Plans: Left Total Hip Replacement - Anterior Approach  Disposition: Home if able to make arrangements  PCP: Dr. Hardin Negus - Patient has been seen preoperatively and felt to be stable for surgery. Cards: Dr. Sallyanne Kuster - "Low risk for major CV complications. Note very frequent asymptomatic PVC's that do not require therapy."  Topical TXA  Anesthesia Issues: NONE  Comments: AVOID LEFT ARM FOR IV'S AND BP'S  Signed electronically by Joelene Millin, III PA-C

## 2014-11-03 NOTE — Progress Notes (Signed)
Utilization review completed.  

## 2014-11-03 NOTE — Interval H&P Note (Signed)
History and Physical Interval Note:  11/03/2014 8:03 AM  [name]  has presented today for surgery, with the diagnosis of OA OF LEFT HIP  The various methods of treatment have been discussed with the patient and family. After consideration of risks, benefits and other options for treatment, the patient has consented to  Procedure(s): LEFT TOTAL HIP ARTHROPLASTY ANTERIOR APPROACH (Left) as a surgical intervention .  The patient's history has been reviewed, patient examined, no change in status, stable for surgery.  I have reviewed the patient's chart and labs.  Questions were answered to the patient's satisfaction.     Gearlean Alf

## 2014-11-04 LAB — BASIC METABOLIC PANEL
ANION GAP: 7 (ref 5–15)
BUN: 15 mg/dL (ref 6–20)
CALCIUM: 8.5 mg/dL — AB (ref 8.9–10.3)
CO2: 24 mmol/L (ref 22–32)
Chloride: 106 mmol/L (ref 101–111)
Creatinine, Ser: 0.99 mg/dL (ref 0.44–1.00)
GFR, EST AFRICAN AMERICAN: 59 mL/min — AB (ref >60)
GFR, EST NON AFRICAN AMERICAN: 51 mL/min — AB (ref >60)
Glucose, Bld: 134 mg/dL — ABNORMAL HIGH (ref 65–99)
Potassium: 4.1 mmol/L (ref 3.5–5.1)
Sodium: 137 mmol/L (ref 135–145)

## 2014-11-04 LAB — CBC
HCT: 31.5 % — ABNORMAL LOW (ref 36.0–46.0)
Hemoglobin: 10.6 g/dL — ABNORMAL LOW (ref 12.0–15.0)
MCH: 32.3 pg (ref 26.0–34.0)
MCHC: 33.7 g/dL (ref 30.0–36.0)
MCV: 96 fL (ref 78.0–100.0)
PLATELETS: 163 10*3/uL (ref 150–400)
RBC: 3.28 MIL/uL — ABNORMAL LOW (ref 3.87–5.11)
RDW: 13.7 % (ref 11.5–15.5)
WBC: 8.7 10*3/uL (ref 4.0–10.5)

## 2014-11-04 LAB — GLUCOSE, CAPILLARY
GLUCOSE-CAPILLARY: 145 mg/dL — AB (ref 65–99)
Glucose-Capillary: 123 mg/dL — ABNORMAL HIGH (ref 65–99)
Glucose-Capillary: 129 mg/dL — ABNORMAL HIGH (ref 65–99)
Glucose-Capillary: 111 mg/dL — ABNORMAL HIGH (ref 65–99)
Glucose-Capillary: 164 mg/dL — ABNORMAL HIGH (ref 65–99)

## 2014-11-04 MED ORDER — OXYCODONE HCL 5 MG PO TABS: 5.0000 mg | ORAL_TABLET | ORAL | Status: DC | PRN

## 2014-11-04 MED ORDER — APIXABAN 2.5 MG PO TABS: 2.5000 mg | ORAL_TABLET | Freq: Two times a day (BID) | ORAL | Status: DC

## 2014-11-04 MED ORDER — TRAMADOL HCL 50 MG PO TABS: 50.0000 mg | ORAL_TABLET | Freq: Four times a day (QID) | ORAL | Status: DC | PRN

## 2014-11-04 MED ORDER — METHOCARBAMOL 500 MG PO TABS: 500.0000 mg | ORAL_TABLET | Freq: Four times a day (QID) | ORAL | Status: DC | PRN

## 2014-11-04 NOTE — Clinical Social Work Note (Signed)
Clinical Social Work Assessment  Patient Details  Name: [name] MRN: [id number] Date of Birth: 03/10/1929  Date of referral:  11/04/14               Reason for consult:  Facility Placement, Discharge Planning                Permission sought to share information with:  Chartered certified accountant granted to share information::  Yes, Verbal Permission Granted  Name::        Agency::     Relationship::     Contact Information:     Housing/Transportation Living arrangements for the past 2 months:  Single Family Home Source of Information:  Patient, Adult Children Patient Interpreter Needed:  None Criminal Activity/Legal Involvement Pertinent to Current Situation/Hospitalization:  No - Comment as needed Significant Relationships:  Adult Children Lives with:    Do you feel safe going back to the place where you live?   ([facility] needed.) Need for family participation in patient care:     Care giving concerns:  Pt's care cannot be managed at home following hospital d/c.    Social Worker assessment / plan:  Pt hospitalized on 11/03/14 for pre planned left total hip replacement. CSW met with pt / son to assist with d/c planning. PT has recommended ST Rehab at d/c. Pt / son are in agreement with this plan. Pt is requesting [facility] for rehab. SNF contacted and clinicals sent for review. [facility] is able to accept pt for rehab at d/c. CSW will continue to follow to assist with d/c planning to SNF.  Employment status:  Retired Forensic scientist:  Medicare PT Recommendations:  [place] / Referral to community resources:  [place]  Patient/Family's Response to care:  Pt / family feel rehab is needed.  Patient/Family's Understanding of and Emotional Response to Diagnosis, Current Treatment, and Prognosis:  Pt / son are aware of pt's medical status. Pt / son are pleased that [facility] is able to accept pt for  rehab. Pt is motivated to work with therapy and is looking forward to feeling better.  Emotional Assessment Appearance:  Appears stated age Attitude/Demeanor/Rapport:  Other (cooperative) Affect (typically observed):  Calm, Pleasant Orientation:  Oriented to Self, Oriented to Place, Oriented to  Time, Oriented to Situation Alcohol / Substance use:  Not Applicable Psych involvement (Current and /or in the community):  No (Comment)  Discharge Needs  Concerns to be addressed:  Discharge Planning Concerns Readmission within the last 30 days:  No Current discharge risk:  None Barriers to Discharge:  No Barriers Identified   Luretha Rued, [place] 11/04/2014, 1:20 PM

## 2014-11-04 NOTE — Clinical Social Work Placement (Signed)
   CLINICAL SOCIAL WORK PLACEMENT  NOTE  Date:  11/04/2014  Patient Details  Name: [name] MRN: [id number] Date of Birth: Jan 12, 1930  Clinical Social Work is seeking post-discharge placement for this patient at the [place] level of care (*CSW will initial, date and re-position this form in  chart as items are completed):  No   Patient/family provided with [facility] Work Department's list of facilities offering this level of care within the geographic area requested by the patient (or if unable, by the patient's family).  Yes   Patient/family informed of their freedom to choose among providers that offer the needed level of care, that participate in Medicare, Medicaid or managed care program needed by the patient, have an available bed and are willing to accept the patient.  Yes   Patient/family informed of [facility]'s ownership interest in [facility] and [facility], as well as of the fact that they are under no obligation to receive care at these facilities.  PASRR submitted to EDS on 11/04/14     PASRR number received on 11/04/14     Existing PASRR number confirmed on       FL2 transmitted to all facilities in geographic area requested by pt/family on 11/04/14     FL2 transmitted to all facilities within larger geographic area on       Patient informed that his/her managed care company has contracts with or will negotiate with certain facilities, including the following:        Yes   Patient/family informed of bed offers received.  Patient chooses bed at [facility]     Physician recommends and patient chooses bed at      Patient to be transferred to [facility] on  .  Patient to be transferred to facility by       Patient family notified on   of transfer.  Name of family member notified:        PHYSICIAN       Additional Comment:    _______________________________________________ Luretha Rued,  [place] 11/04/2014, 1:47 PM

## 2014-11-04 NOTE — Evaluation (Signed)
Occupational Therapy Evaluation Patient Details Name: [name] MRN: [id number] DOB: 12/21/1929 Today's Date: 11/04/2014    History of Present Illness 81 yo female s/p L THA 11/03/14   Clinical Impression   Pt was admitted for the above.  She will benefit from continued OT to increase safety and independence with adls.  Prior to admission, she was independent. She now needs mod to max A for LB adls.  Goals in acute are from supervision to min A    Follow Up Recommendations  SNF    Equipment Recommendations  None recommended by OT (has 3:1)    Recommendations for Other Services       Precautions / Restrictions Precautions Precautions: Fall Restrictions LLE Weight Bearing: Weight bearing as tolerated      Mobility Bed Mobility         Supine to sit: Min assist     General bed mobility comments: assist for LLE; HOB raised  Transfers   Equipment used: Rolling walker (2 wheeled) Transfers: Sit to/from Stand Sit to Stand: Min assist         General transfer comment: assist to rise, stabilize, control descent. VCs safety, technique, hand placement. Stood at [facility] for a few minutes before attempting steps.    Balance                                            ADL Overall ADL's : Needs assistance/impaired     Grooming: Set up;Sitting;Wash/dry hands;Wash/dry face   Upper Body Bathing: Set up;Sitting   Lower Body Bathing: Moderate assistance;Sit to/from stand   Upper Body Dressing : Set up;Sitting   Lower Body Dressing: Maximal assistance;Sit to/from stand   Toilet Transfer: Minimal assistance;Stand-pivot (to recliner)             General ADL Comments: educated on working with pain tolerance and AE. She has a Secondary school teacher and sock aide at home.  She usually uses reacher to retrieve items from floor; she doesn't want to bother with sock aide  Pt did have one LOB during peri care when standing     Vision     Perception     Praxis       Pertinent Vitals/Pain Pain Score: 8  (none at rest) Pain Location: L hip with weight bearing Pain Descriptors / Indicators: Burning Pain Intervention(s): Limited activity within patient's tolerance;Monitored during session;Repositioned;Ice applied     Hand Dominance     Extremity/Trunk Assessment Upper Extremity Assessment Upper Extremity Assessment: Overall WFL for tasks assessed           Communication Communication Communication: HOH   Cognition Arousal/Alertness: Awake/alert Behavior During Therapy: WFL for tasks assessed/performed Overall Cognitive Status: Within Functional Limits for tasks assessed                     General Comments       Exercises       Shoulder Instructions      Home Living Family/patient expects to be discharged to:: Skilled nursing facility Living Arrangements: Alone                               Additional Comments: does not have daytime assistance at home      Prior Functioning/Environment Level of Independence: Independent  OT Diagnosis: Generalized weakness;Acute pain   OT Problem List: Decreased strength;Decreased activity tolerance;Decreased knowledge of use of DME or AE;Pain   OT Treatment/Interventions: Self-care/ADL training;DME and/or AE instruction;Patient/family education;Balance training    OT Goals(Current goals can be found in the care plan section) Acute Rehab OT Goals Patient Stated Goal: Less pain. regain independence ADL Goals Pt Will Perform Grooming: with supervision;standing Pt Will Perform Lower Body Bathing: with min assist;with adaptive equipment;sit to/from stand Pt Will Transfer to Toilet: with min guard assist;ambulating;bedside commode Pt Will Perform Toileting - Clothing Manipulation and hygiene: with min guard assist;sit to/from stand  OT Frequency: Min 2X/week   Barriers to D/C:            Co-evaluation              End of Session     Activity Tolerance: Patient limited by fatigue Patient left: in chair;with call bell/phone within reach   Time: 0853-0922 OT Time Calculation (min): 29 min Charges:  OT General Charges $OT Visit: 1 Procedure OT Evaluation $Initial OT Evaluation Tier I: 1 Procedure OT Treatments $Self Care/Home Management : 8-22 mins G-Codes:    SPENCER,MARYELLEN 11-08-14, 9:34 AM  Lesle Chris, OTR/L [phone] 11-08-2014

## 2014-11-04 NOTE — Discharge Instructions (Signed)
Dr. Gaynelle Arabian Total Joint Specialist [facility] [address] [place], [place] [address] [phone]  ANTERIOR APPROACH TOTAL HIP REPLACEMENT POSTOPERATIVE DIRECTIONS   Hip Rehabilitation, Guidelines Following Surgery  The results of a hip operation are greatly improved after range of motion and muscle strengthening exercises. Follow all safety measures which are given to protect your hip. If any of these exercises cause increased pain or swelling in your joint, decrease the amount until you are comfortable again. Then slowly increase the exercises. Call your caregiver if you have problems or questions.   HOME CARE INSTRUCTIONS  Remove items at home which could result in a fall. This includes throw rugs or furniture in walking pathways.   ICE to the affected hip every three hours for 30 minutes at a time and then as needed for pain and swelling.  Continue to use ice on the hip for pain and swelling from surgery. You may notice swelling that will progress down to the foot and ankle.  This is normal after surgery.  Elevate the leg when you are not up walking on it.    Continue to use the breathing machine which will help keep your temperature down.  It is common for your temperature to cycle up and down following surgery, especially at night when you are not up moving around and exerting yourself.  The breathing machine keeps your lungs expanded and your temperature down.   DIET You may resume your previous home diet once your are discharged from the hospital.  DRESSING / WOUND CARE / SHOWERING You may start showering once you are discharged home but do not submerge the incision under water. Just pat the incision dry and apply a dry gauze dressing on daily. Change the surgical dressing daily and reapply a dry dressing each time.  ACTIVITY Walk with your walker as instructed. Use walker as long as suggested by your caregivers. Avoid periods of inactivity  such as sitting longer than an hour when not asleep. This helps prevent blood clots.  You may resume a sexual relationship in one month or when given the OK by your doctor.  You may return to work once you are cleared by your doctor.  Do not drive a car for 6 weeks or until released by you surgeon.  Do not drive while taking narcotics.  WEIGHT BEARING Other:  Weight bearing as tolerated  POSTOPERATIVE CONSTIPATION PROTOCOL Constipation - defined medically as fewer than three stools per week and severe constipation as less than one stool per week.  One of the most common issues patients have following surgery is constipation.  Even if you have a regular bowel pattern at home, your normal regimen is likely to be disrupted due to multiple reasons following surgery.  Combination of anesthesia, postoperative narcotics, change in appetite and fluid intake all can affect your bowels.  In order to avoid complications following surgery, here are some recommendations in order to help you during your recovery period.  Colace (docusate) - Pick up an over-the-counter form of Colace or another stool softener and take twice a day as long as you are requiring postoperative pain medications.  Take with a full glass of water daily.  If you experience loose stools or diarrhea, hold the colace until you stool forms back up.  If your symptoms do not get better within 1 week or if they get worse, check with your doctor.  Dulcolax (bisacodyl) - Pick up over-the-counter and take as directed by  the product packaging as needed to assist with the movement of your bowels.  Take with a full glass of water.  Use this product as needed if not relieved by Colace only.   MiraLax (polyethylene glycol) - Pick up over-the-counter to have on hand.  MiraLax is a solution that will increase the amount of water in your bowels to assist with bowel movements.  Take as directed and can mix with a glass of water, juice, soda, coffee, or tea.   Take if you go more than two days without a movement. Do not use MiraLax more than once per day. Call your doctor if you are still constipated or irregular after using this medication for 7 days in a row.  If you continue to have problems with postoperative constipation, please contact the office for further assistance and recommendations.  If you experience "the worst abdominal pain ever" or develop nausea or vomiting, please contact the office immediatly for further recommendations for treatment.  ITCHING  If you experience itching with your medications, try taking only a single pain pill, or even half a pain pill at a time.  You can also use Benadryl over the counter for itching or also to help with sleep.   TED HOSE STOCKINGS Wear the elastic stockings on both legs for three weeks following surgery during the day but you may remove then at night for sleeping.  MEDICATIONS See your medication summary on the After Visit Summary that the nursing staff will review with you prior to discharge.  You may have some home medications which will be placed on hold until you complete the course of blood thinner medication.  It is important for you to complete the blood thinner medication as prescribed by your surgeon.  Continue your approved medications as instructed at time of discharge.  PRECAUTIONS If you experience chest pain or shortness of breath - call 911 immediately for transfer to the hospital emergency department.  If you develop a fever greater that 101 F, purulent drainage from wound, increased redness or drainage from wound, foul odor from the wound/dressing, or calf pain - CONTACT YOUR SURGEON.                                                   FOLLOW-UP APPOINTMENTS Make sure you keep all of your appointments after your operation with your surgeon and caregivers. You should call the office at the above phone number and make an appointment for approximately two weeks after the date of your  surgery or on the date instructed by your surgeon outlined in the "After Visit Summary".  RANGE OF MOTION AND STRENGTHENING EXERCISES  These exercises are designed to help you keep full movement of your hip joint. Follow your caregiver's or physical therapist's instructions. Perform all exercises about fifteen times, three times per day or as directed. Exercise both hips, even if you have had only one joint replacement. These exercises can be done on a training (exercise) mat, on the floor, on a table or on a bed. Use whatever works the best and is most comfortable for you. Use music or television while you are exercising so that the exercises are a pleasant break in your day. This will make your life better with the exercises acting as a break in routine you can look forward to.  Lying on  your back, slowly slide your foot toward your buttocks, raising your knee up off the floor. Then slowly slide your foot back down until your leg is straight again.  Lying on your back spread your legs as far apart as you can without causing discomfort.  Lying on your side, raise your upper leg and foot straight up from the floor as far as is comfortable. Slowly lower the leg and repeat.  Lying on your back, tighten up the muscle in the front of your thigh (quadriceps muscles). You can do this by keeping your leg straight and trying to raise your heel off the floor. This helps strengthen the largest muscle supporting your knee.  Lying on your back, tighten up the muscles of your buttocks both with the legs straight and with the knee bent at a comfortable angle while keeping your heel on the floor.   IF YOU ARE TRANSFERRED TO A SKILLED REHAB FACILITY If the patient is transferred to a skilled rehab facility following release from the hospital, a list of the current medications will be sent to the facility for the patient to continue.  When discharged from the skilled rehab facility, please have the facility set up the  patient's [place] prior to being released. Also, the skilled facility will be responsible for providing the patient with their medications at time of release from the facility to include their pain medication, the muscle relaxants, and their blood thinner medication. If the patient is still at the rehab facility at time of the two week follow up appointment, the skilled rehab facility will also need to assist the patient in arranging follow up appointment in our office and any transportation needs.  MAKE SURE YOU:  Understand these instructions.  Get help right away if you are not doing well or get worse.    Pick up stool softner and laxative for home use following surgery while on pain medications. Do not submerge incision under water. Please use good hand washing techniques while changing dressing each day. May shower starting three days after surgery. Please use a clean towel to pat the incision dry following showers. Continue to use ice for pain and swelling after surgery. Do not use any lotions or creams on the incision until instructed by your surgeon.   Information on my medicine - ELIQUIS (apixaban)  This medication education was reviewed with me or my healthcare representative as part of my discharge preparation.  The pharmacist that spoke with me during my hospital stay was:  Mickey Hebel, [facility]  Why was Eliquis prescribed for you? Eliquis was prescribed for you to reduce the risk of blood clots forming after orthopedic surgery.    What do You need to know about Eliquis? Take your Eliquis TWICE DAILY - one tablet in the morning and one tablet in the evening with or without food.  It would be best to take the dose about the same time each day.  If you have difficulty swallowing the tablet whole please discuss with your pharmacist how to take the medication safely.  Take Eliquis exactly as prescribed by your doctor and DO NOT stop taking Eliquis  without talking to the doctor who prescribed the medication.  Stopping without other medication to take the place of Eliquis may increase your risk of developing a clot.  After discharge, you should have regular check-up appointments with your healthcare provider that is prescribing your Eliquis.  What do you do if you miss a dose? If a  dose of ELIQUIS is not taken at the scheduled time, take it as soon as possible on the same day and twice-daily administration should be resumed.  The dose should not be doubled to make up for a missed dose.  Do not take more than one tablet of ELIQUIS at the same time.  Important Safety Information A possible side effect of Eliquis is bleeding. You should call your healthcare provider right away if you experience any of the following: ? Bleeding from an injury or your nose that does not stop. ? Unusual colored urine (red or dark brown) or unusual colored stools (red or black). ? Unusual bruising for unknown reasons. ? A serious fall or if you hit your head (even if there is no bleeding).  Some medicines may interact with Eliquis and might increase your risk of bleeding or clotting while on Eliquis. To help avoid this, consult your healthcare provider or pharmacist prior to using any new prescription or non-prescription medications, including herbals, vitamins, non-steroidal anti-inflammatory drugs (NSAIDs) and supplements.  This website has more information on Eliquis (apixaban): http://www.eliquis.com/eliquis/home

## 2014-11-04 NOTE — Progress Notes (Signed)
Physical Therapy Treatment Patient Details Name: [name] MRN: [id number] DOB: 1929/07/01 Today's Date: 11/04/2014    History of Present Illness 81 yo female s/p L THA 11/03/14    PT Comments    Progressing with mobility. Plan is for d/c to [facility] SN[facility] tomorrow.   Follow Up Recommendations  SNF     Equipment Recommendations  None recommended by PT    Recommendations for Other Services OT consult     Precautions / Restrictions Precautions Precautions: Fall Restrictions Weight Bearing Restrictions: No LLE Weight Bearing: Weight bearing as tolerated    Mobility  Bed Mobility Overal bed mobility: Needs Assistance Bed Mobility: Sit to Supine       Sit to supine: Min assist   General bed mobility comments: assist for L LE  Transfers Overall transfer level: Needs assistance Equipment used: Rolling walker (2 wheeled) Transfers: Sit to/from Stand Sit to Stand: Min assist         General transfer comment: assist to rise, stabilize, control descent. VCs safety, technique, hand placement.   Ambulation/Gait Ambulation/Gait assistance: Min assist Ambulation Distance (Feet): 55 Feet Assistive device: Rolling walker (2 wheeled) Gait Pattern/deviations: Step-to pattern;Step-through pattern;Decreased stride length;Antalgic     General Gait Details: small amount of assist to stabilize. VCs safety, technique, sequence. fatigues fairly easily still.    Stairs            Wheelchair Mobility    Modified Rankin (Stroke Patients Only)       Balance                                    Cognition Arousal/Alertness: Awake/alert Behavior During Therapy: WFL for tasks assessed/performed Overall Cognitive Status: Within Functional Limits for tasks assessed                      Exercises Total Joint Exercises Ankle Circles/Pumps: AROM;Both;10 reps;Supine Quad Sets: AROM;Both;10 reps;Supine Heel Slides: AAROM;Left;10 reps;Supine Hip  ABduction/ADduction: AAROM;Left;10 reps;Supine    General Comments        Pertinent Vitals/Pain Pain Assessment: 0-10 Pain Score: 7  Pain Location: L hip with activity Pain Descriptors / Indicators: Burning;Sore Pain Intervention(s): Monitored during session;Ice applied;Repositioned    Home Living                      Prior Function            PT Goals (current goals can now be found in the care plan section) Progress towards PT goals: Progressing toward goals    Frequency  7X/week    PT Plan Current plan remains appropriate    Co-evaluation             End of Session Equipment Utilized During Treatment: Gait belt Activity Tolerance: Patient limited by fatigue;Patient limited by pain Patient left: in bed;with call bell/phone within reach     Time: 1327-1349 PT Time Calculation (min) (ACUTE ONLY): 22 min  Charges:  $Gait Training: 8-22 mins                    G Codes:      Weston Anna, MPT Pager: [phone]

## 2014-11-04 NOTE — Care Management Note (Signed)
Case Management Note  Patient Details  Name: MEMORY HEINRICHS MRN: [id number] Date of Birth: 1929/10/24  Subjective/Objective:                  LEFT TOTAL HIP ARTHROPLASTY ANTERIOR APPROACH (Left) Action/Plan:  Discharge planning Expected Discharge Date:                  Expected Discharge Plan:  [place]  In-House Referral:     Discharge planning Services  CM Consult  Post Acute Care Choice:    Choice offered to:     DME Arranged:    DME Agency:     HH Arranged:    [place] Agency:     Status of Service:     Medicare Important Message Given:    Date Medicare IM Given:    Medicare IM give by:    Date Additional Medicare IM Given:    Additional Medicare Important Message give by:     If discussed at [place] of Stay Meetings, dates discussed:    Additional Comments: CM notes pt to go to SNF; CSW arranging. No other CM needs were communicated. Dellie Catholic, RN 11/04/2014, 1:01 PM

## 2014-11-04 NOTE — Progress Notes (Signed)
Physical Therapy Treatment Patient Details Name: [name] MRN: [id number] DOB: Apr 20, 1929 Today's Date: 11/04/2014    History of Present Illness 81 yo female s/p L THA 11/03/14    PT Comments    Progressing slowly with mobility. Pain rated 9/10 with activity. Recommend ST rehab at SNF.  Follow Up Recommendations  SNF     Equipment Recommendations  None recommended by PT    Recommendations for Other Services OT consult     Precautions / Restrictions Precautions Precautions: Fall Restrictions Weight Bearing Restrictions: No LLE Weight Bearing: Weight bearing as tolerated    Mobility  Bed Mobility         Supine to sit: Min assist     General bed mobility comments: oob in recliner  Transfers Overall transfer level: Needs assistance Equipment used: Rolling walker (2 wheeled) Transfers: Sit to/from Stand Sit to Stand: Min assist         General transfer comment: assist to rise, stabilize, control descent. VCs safety, technique, hand placement.   Ambulation/Gait Ambulation/Gait assistance: Min assist Ambulation Distance (Feet): 40 Feet Assistive device: Rolling walker (2 wheeled) Gait Pattern/deviations: Step-to pattern;Antalgic;Trunk flexed     General Gait Details: difficulty advancing L LE. VCs safety, technique, sequence. Assist to stabilize pt and maneuver with RW. Increased time. Fatigues easily.   Stairs            Wheelchair Mobility    Modified Rankin (Stroke Patients Only)       Balance                                    Cognition Arousal/Alertness: Awake/alert Behavior During Therapy: WFL for tasks assessed/performed Overall Cognitive Status: Within Functional Limits for tasks assessed                      Exercises Total Joint Exercises Ankle Circles/Pumps: AROM;Both;10 reps;Supine Quad Sets: AROM;Both;10 reps;Supine Heel Slides: AAROM;Left;10 reps;Supine Hip ABduction/ADduction: AAROM;Left;10  reps;Supine    General Comments        Pertinent Vitals/Pain Pain Assessment: 0-10 Pain Score: 9  Pain Location: L hip with activity Pain Descriptors / Indicators: Burning;Sore Pain Intervention(s): Monitored during session;Ice applied;Repositioned    Home Living Family/patient expects to be discharged to:: Skilled nursing facility Living Arrangements: Alone             Additional Comments: does not have daytime assistance at home    Prior Function Level of Independence: Independent          PT Goals (current goals can now be found in the care plan section) Acute Rehab PT Goals Patient Stated Goal: Less pain. regain independence Progress towards PT goals: Progressing toward goals    Frequency  7X/week    PT Plan Current plan remains appropriate    Co-evaluation             End of Session Equipment Utilized During Treatment: Gait belt Activity Tolerance: Patient limited by fatigue;Patient limited by pain Patient left: in chair;with call bell/phone within reach     Time: 1010-1031 PT Time Calculation (min) (ACUTE ONLY): 21 min  Charges:  $Gait Training: 8-22 mins                    G Codes:      Weston Anna, MPT Pager: [phone]

## 2014-11-04 NOTE — Progress Notes (Signed)
   Subjective: 1 Day Post-Op Procedure(s) (LRB): LEFT TOTAL HIP ARTHROPLASTY ANTERIOR APPROACH (Left) Patient reports pain as mild.   Patient seen in rounds with Dr. Wynelle Link. Patient is well, and has had no acute complaints or problems other than poor sleep last night. No SOB or chest pain. Patient reports that she was unable to find assistance at home and will require SNF placement.    Objective: Vital signs in last 24 hours: Temp:  [95.4 F (35.2 C)-99.3 F (37.4 C)] 98.1 F (36.7 C) (09/22 0543) Pulse Rate:  [69-81] 69 (09/22 0543) Resp:  [13-18] 16 (09/22 0543) BP: (130-183)/(54-90) 164/60 mmHg (09/22 0543) SpO2:  [98 %-100 %] 98 % (09/22 0543)  Intake/Output from previous day:  Intake/Output Summary (Last 24 hours) at 11/04/14 0750 Last data filed at 11/04/14 0600  Gross per 24 hour  Intake 3948.5 ml  Output   2265 ml  Net 1683.5 ml     Labs:  Recent Labs  11/04/14 0516  HGB 10.6*    Recent Labs  11/04/14 0516  WBC 8.7  RBC 3.28*  HCT 31.5*  PLT 163    Recent Labs  11/04/14 0516  NA 137  K 4.1  CL 106  CO2 24  BUN 15  CREATININE 0.99  GLUCOSE 134*  CALCIUM 8.5*    EXAM General - Patient is Alert and Oriented Extremity - Neurologically intact Dorsiflexion/Plantar flexion intact No cellulitis present Compartment soft Dressing - dressing C/D/I Motor Function - intact, moving foot and toes well on exam.  Hemovac pulled without difficulty.  Past Medical History  Diagnosis Date  . Hypertension   . Hyperlipidemia   . Diabetes mellitus   . CAD (coronary artery disease)     Asymptomatic  . Arthritis   . History of shingles     ON BACK  . Cancer   . History of breast cancer 2001  . Vertigo     Assessment/Plan: 1 Day Post-Op Procedure(s) (LRB): LEFT TOTAL HIP ARTHROPLASTY ANTERIOR APPROACH (Left) Principal Problem:   OA (osteoarthritis) of hip  Estimated body mass index is 28.65 kg/(m^2) as calculated from the following:   Height  as of this encounter: 5\' 4"  (1.626 m).   Weight as of this encounter: 75.751 kg (167 lb). Advance diet Up with therapy D/C IV fluids Plan for discharge tomorrow  DVT Prophylaxis - Eliquis Weight Bearing As Tolerated  D/C Knee Immobilizer Hemovac Pulled Begin Therapy   Plan for DC tomorrow to SNF. Patient is interested in [facility].   Ardeen Jourdain, PA-C Orthopaedic Surgery 11/04/2014, 7:50 AM

## 2014-11-05 ENCOUNTER — Encounter
Admission: RE | Admit: 2014-11-05 | Discharge: 2014-11-05 | Disposition: A | Discharge status: Home / Self Care | Payer: Medicare Other | Source: Ambulatory Visit | Attending: Internal Medicine | Admitting: Internal Medicine

## 2014-11-05 DIAGNOSIS — E119 Type 2 diabetes mellitus without complications: Secondary | ICD-10-CM | POA: Diagnosis not present

## 2014-11-05 DIAGNOSIS — Z471 Aftercare following joint replacement surgery: Secondary | ICD-10-CM | POA: Diagnosis not present

## 2014-11-05 DIAGNOSIS — I10 Essential (primary) hypertension: Secondary | ICD-10-CM | POA: Diagnosis not present

## 2014-11-05 DIAGNOSIS — E785 Hyperlipidemia, unspecified: Secondary | ICD-10-CM | POA: Diagnosis not present

## 2014-11-05 DIAGNOSIS — M159 Polyosteoarthritis, unspecified: Secondary | ICD-10-CM | POA: Diagnosis not present

## 2014-11-05 DIAGNOSIS — M6281 Muscle weakness (generalized): Secondary | ICD-10-CM | POA: Diagnosis not present

## 2014-11-05 DIAGNOSIS — I2510 Atherosclerotic heart disease of native coronary artery without angina pectoris: Secondary | ICD-10-CM | POA: Diagnosis not present

## 2014-11-05 DIAGNOSIS — Z7901 Long term (current) use of anticoagulants: Secondary | ICD-10-CM | POA: Diagnosis not present

## 2014-11-05 DIAGNOSIS — R2689 Other abnormalities of gait and mobility: Secondary | ICD-10-CM | POA: Diagnosis not present

## 2014-11-05 DIAGNOSIS — Z96642 Presence of left artificial hip joint: Secondary | ICD-10-CM | POA: Diagnosis not present

## 2014-11-05 LAB — BASIC METABOLIC PANEL
Anion gap: 11 (ref 5–15)
BUN: 22 mg/dL — AB (ref 6–20)
CHLORIDE: 107 mmol/L (ref 101–111)
CO2: 24 mmol/L (ref 22–32)
Calcium: 8.5 mg/dL — ABNORMAL LOW (ref 8.9–10.3)
Creatinine, Ser: 1.13 mg/dL — ABNORMAL HIGH (ref 0.44–1.00)
GFR calc non Af Amer: 44 mL/min — ABNORMAL LOW (ref >60)
GFR, EST AFRICAN AMERICAN: 51 mL/min — AB (ref >60)
Glucose, Bld: 130 mg/dL — ABNORMAL HIGH (ref 65–99)
Potassium: 3.6 mmol/L (ref 3.5–5.1)
SODIUM: 142 mmol/L (ref 135–145)

## 2014-11-05 LAB — GLUCOSE, CAPILLARY
GLUCOSE-CAPILLARY: 119 mg/dL — AB (ref 65–99)
GLUCOSE-CAPILLARY: 117 mg/dL — AB (ref 65–99)

## 2014-11-05 LAB — CBC
HEMATOCRIT: 29.7 % — AB (ref 36.0–46.0)
HEMOGLOBIN: 10 g/dL — AB (ref 12.0–15.0)
MCH: 32.5 pg (ref 26.0–34.0)
MCHC: 33.7 g/dL (ref 30.0–36.0)
MCV: 96.4 fL (ref 78.0–100.0)
Platelets: 254 10*3/uL (ref 150–400)
RBC: 3.08 MIL/uL — AB (ref 3.87–5.11)
RDW: 13.9 % (ref 11.5–15.5)
WBC: 9.9 10*3/uL (ref 4.0–10.5)

## 2014-11-05 NOTE — Clinical Documentation Improvement (Signed)
Orthopedic  Abnormal Lab/Test Results:  Preadmit H/H: 12.2/36.3, to postop 10.0/29.7  Possible Clinical Conditions associated with below indicators  Expected ABLA  Other Condition  Cannot Clinically Determine  Treatment Provided: -- Monitoring H/H  Please exercise your independent, professional judgment when responding. A specific answer is not anticipated or expected.   Thank You,  [place] [phone]

## 2014-11-05 NOTE — Discharge Summary (Signed)
Physician Discharge Summary   Patient ID: [name] MRN: [id number] DOB/AGE: January 18, 1930 81 y.o.  Admit date: 11/03/2014 Discharge date: 11/05/2014  Primary Diagnosis: Primary osteoarthritis left hip  Admission Diagnoses:  Past Medical History  Diagnosis Date  . Hypertension   . Hyperlipidemia   . Diabetes mellitus   . CAD (coronary artery disease)     Asymptomatic  . Arthritis   . History of shingles     ON BACK  . Cancer   . History of breast cancer 2001  . Vertigo    Discharge Diagnoses:   Principal Problem:   OA (osteoarthritis) of hip  Estimated body mass index is 28.65 kg/(m^2) as calculated from the following:   Height as of this encounter: $RemoveBeforeD'5\' 4"'[id number]$  (1.626 m).   Weight as of this encounter: 75.751 kg (167 lb).  Procedure(s) (LRB): LEFT TOTAL HIP ARTHROPLASTY ANTERIOR APPROACH (Left)   Consults: None  HPI: The patient is a 81 year old female who is being followed for their left hip pain (and (L) groin pain) and osteoarthritis. Symptoms reported include: pain at night (having trouble sleeping. Pt reports having to get up multiple times a night to ambulate to get a little relief), stiffness, pain with weightbearing and difficulty ambulating (and difficulty standing). The patient feels that they are doing poorly and report their pain level to be severe. The following medication has been used for pain control: ibuprofen (qpm). The patient has reported symptom improvement with: activity modification, NSAIDs and rest while they have not gotten any relief of their symptoms with: Cortisone injections. The hip had gotten much worse over time. She recently saw [facility] Landry and it was noted that she had bone on bone arthritis, which had progressed significantly. Symptoms are much worse than they were. She is at a stage now where hips taken over her life. She is ready to proceed with surgery.  They have been treated conservatively in the past for the above stated problem and despite  conservative measures, they continue to have progressive pain and severe functional limitations and dysfunction. They have failed non-operative management including home exercise, medications. It is felt that they would benefit from undergoing total joint replacement. Risks and benefits of the procedure have been discussed with the patient and they elect to proceed with surgery. There are no active contraindications to surgery such as ongoing infection or rapidly progressive neurological disease.  Laboratory Data: Admission on 11/03/2014  Component Date Value Ref Range Status  . Glucose-Capillary 11/03/2014 101* 65 - 99 mg/dL Final  . Comment 1 11/03/2014 Notify RN   Final  . Glucose-Capillary 11/03/2014 145* 65 - 99 mg/dL Final  . Comment 1 11/03/2014 Notify RN   Final  . Comment 2 11/03/2014 Document in Chart   Final  . WBC 11/04/2014 8.7  4.0 - 10.5 K/uL Final  . RBC 11/04/2014 3.28* 3.87 - 5.11 MIL/uL Final  . Hemoglobin 11/04/2014 10.6* 12.0 - 15.0 g/dL Final  . HCT 11/04/2014 31.5* 36.0 - 46.0 % Final  . MCV 11/04/2014 96.0  78.0 - 100.0 fL Final  . MCH 11/04/2014 32.3  26.0 - 34.0 pg Final  . MCHC 11/04/2014 33.7  30.0 - 36.0 g/dL Final  . RDW 11/04/2014 13.7  11.5 - 15.5 % Final  . Platelets 11/04/2014 163  150 - 400 K/uL Final  . Sodium 11/04/2014 137  135 - 145 mmol/L Final  . Potassium 11/04/2014 4.1  3.5 - 5.1 mmol/L Final  . Chloride 11/04/2014 106  101 -  111 mmol/L Final  . CO2 11/04/2014 24  22 - 32 mmol/L Final  . Glucose, Bld 11/04/2014 134* 65 - 99 mg/dL Final  . BUN 11/04/2014 15  6 - 20 mg/dL Final  . Creatinine, Ser 11/04/2014 0.99  0.44 - 1.00 mg/dL Final  . Calcium 11/04/2014 8.5* 8.9 - 10.3 mg/dL Final  . GFR calc non Af Amer 11/04/2014 51* >60 mL/min Final  . GFR calc Af Amer 11/04/2014 59* >60 mL/min Final   Comment: (NOTE) The eGFR has been calculated using the CKD EPI equation. This calculation has not been validated in all clinical situations. eGFR's  persistently <60 mL/min signify possible Chronic Kidney Disease.   . Anion gap 11/04/2014 7  5 - 15 Final  . Glucose-Capillary 11/03/2014 154* 65 - 99 mg/dL Final  . Glucose-Capillary 11/03/2014 149* 65 - 99 mg/dL Final  . Comment 1 11/03/2014 Notify RN   Final  . Glucose-Capillary 11/04/2014 123* 65 - 99 mg/dL Final  . Glucose-Capillary 11/04/2014 129* 65 - 99 mg/dL Final  . Glucose-Capillary 11/04/2014 111* 65 - 99 mg/dL Final  . WBC 11/05/2014 9.9  4.0 - 10.5 K/uL Final  . RBC 11/05/2014 3.08* 3.87 - 5.11 MIL/uL Final  . Hemoglobin 11/05/2014 10.0* 12.0 - 15.0 g/dL Final  . HCT 11/05/2014 29.7* 36.0 - 46.0 % Final  . MCV 11/05/2014 96.4  78.0 - 100.0 fL Final  . MCH 11/05/2014 32.5  26.0 - 34.0 pg Final  . MCHC 11/05/2014 33.7  30.0 - 36.0 g/dL Final  . RDW 11/05/2014 13.9  11.5 - 15.5 % Final  . Platelets 11/05/2014 254  150 - 400 K/uL Final  . Sodium 11/05/2014 142  135 - 145 mmol/L Final  . Potassium 11/05/2014 3.6  3.5 - 5.1 mmol/L Final  . Chloride 11/05/2014 107  101 - 111 mmol/L Final  . CO2 11/05/2014 24  22 - 32 mmol/L Final  . Glucose, Bld 11/05/2014 130* 65 - 99 mg/dL Final  . BUN 11/05/2014 22* 6 - 20 mg/dL Final  . Creatinine, Ser 11/05/2014 1.13* 0.44 - 1.00 mg/dL Final  . Calcium 11/05/2014 8.5* 8.9 - 10.3 mg/dL Final  . GFR calc non Af Amer 11/05/2014 44* >60 mL/min Final  . GFR calc Af Amer 11/05/2014 51* >60 mL/min Final   Comment: (NOTE) The eGFR has been calculated using the CKD EPI equation. This calculation has not been validated in all clinical situations. eGFR's persistently <60 mL/min signify possible Chronic Kidney Disease.   . Anion gap 11/05/2014 11  5 - 15 Final  . Glucose-Capillary 11/04/2014 164* 65 - 99 mg/dL Final  . Glucose-Capillary 11/04/2014 145* 65 - 99 mg/dL Final  . Glucose-Capillary 11/05/2014 119* 65 - 99 mg/dL Final  Hospital Outpatient Visit on 10/28/2014  Component Date Value Ref Range Status  . MRSA, PCR 10/28/2014 NEGATIVE   NEGATIVE Final  . Staphylococcus aureus 10/28/2014 NEGATIVE  NEGATIVE Final   Comment:        The Xpert SA Assay (FDA approved for NASAL specimens in patients over 65 years of age), is one component of a comprehensive surveillance program.  Test performance has been validated by [facility] for patients greater than or equal to 84 year old. It is not intended to diagnose infection nor to guide or monitor treatment.   Marland Kitchen aPTT 10/28/2014 24  24 - 37 seconds Final  . WBC 10/28/2014 5.8  4.0 - 10.5 K/uL Final  . RBC 10/28/2014 3.74* 3.87 - 5.11 MIL/uL Final  .  Hemoglobin 10/28/2014 12.2  12.0 - 15.0 g/dL Final  . HCT 10/28/2014 36.3  36.0 - 46.0 % Final  . MCV 10/28/2014 97.1  78.0 - 100.0 fL Final  . MCH 10/28/2014 32.6  26.0 - 34.0 pg Final  . MCHC 10/28/2014 33.6  30.0 - 36.0 g/dL Final  . RDW 10/28/2014 14.0  11.5 - 15.5 % Final  . Platelets 10/28/2014 280  150 - 400 K/uL Final  . Sodium 10/28/2014 139  135 - 145 mmol/L Final  . Potassium 10/28/2014 4.6  3.5 - 5.1 mmol/L Final  . Chloride 10/28/2014 105  101 - 111 mmol/L Final  . CO2 10/28/2014 25  22 - 32 mmol/L Final  . Glucose, Bld 10/28/2014 123* 65 - 99 mg/dL Final  . BUN 10/28/2014 18  6 - 20 mg/dL Final  . Creatinine, Ser 10/28/2014 1.19* 0.44 - 1.00 mg/dL Final  . Calcium 10/28/2014 9.5  8.9 - 10.3 mg/dL Final  . Total Protein 10/28/2014 6.9  6.5 - 8.1 g/dL Final  . Albumin 10/28/2014 4.1  3.5 - 5.0 g/dL Final  . AST 10/28/2014 32  15 - 41 U/L Final  . ALT 10/28/2014 27  14 - 54 U/L Final  . Alkaline Phosphatase 10/28/2014 121  38 - 126 U/L Final  . Total Bilirubin 10/28/2014 0.7  0.3 - 1.2 mg/dL Final  . GFR calc non Af Amer 10/28/2014 41* >60 mL/min Final  . GFR calc Af Amer 10/28/2014 48* >60 mL/min Final   Comment: (NOTE) The eGFR has been calculated using the CKD EPI equation. This calculation has not been validated in all clinical situations. eGFR's persistently <60 mL/min signify possible Chronic  Kidney Disease.   . Anion gap 10/28/2014 9  5 - 15 Final  . Prothrombin Time 10/28/2014 13.4  11.6 - 15.2 seconds Final  . INR 10/28/2014 1.00  0.00 - 1.49 Final  . ABO/RH(D) 10/28/2014 O POS   Final  . Antibody Screen 10/28/2014 NEG   Final  . Sample Expiration 10/28/2014 11/06/2014   Final  . Color, Urine 10/28/2014 YELLOW  YELLOW Final  . APPearance 10/28/2014 CLOUDY* CLEAR Final  . Specific Gravity, Urine 10/28/2014 1.023  1.005 - 1.030 Final  . pH 10/28/2014 5.0  5.0 - 8.0 Final  . Glucose, UA 10/28/2014 NEGATIVE  NEGATIVE mg/dL Final  . Hgb urine dipstick 10/28/2014 NEGATIVE  NEGATIVE Final  . Bilirubin Urine 10/28/2014 NEGATIVE  NEGATIVE Final  . Ketones, ur 10/28/2014 NEGATIVE  NEGATIVE mg/dL Final  . Protein, ur 10/28/2014 NEGATIVE  NEGATIVE mg/dL Final  . Urobilinogen, UA 10/28/2014 0.2  0.0 - 1.0 mg/dL Final  . Nitrite 10/28/2014 NEGATIVE  NEGATIVE Final  . Leukocytes, UA 10/28/2014 MODERATE* NEGATIVE Final  . Squamous Epithelial / LPF 10/28/2014 MANY* RARE Final  . WBC, UA 10/28/2014 3-6  <3 WBC/hpf Final  . Bacteria, UA 10/28/2014 FEW* RARE Final     X-Rays:Dg Pelvis Portable  11/03/2014   CLINICAL DATA:  Postop left total hip replacement.  EXAM: PORTABLE PELVIS 1-2 VIEWS  COMPARISON:  None.  FINDINGS: Changes of left hip replacement. Normal AP alignment. Moderate degenerative changes in the right hip. No hardware or bony complicating feature.  IMPRESSION: Left hip replacement without visible complicating feature.   Electronically Signed   By: Rolm Baptise M.D.   On: 11/03/2014 10:53   Dg C-arm 1-60 Min-no Report  11/03/2014   CLINICAL DATA: surgery   C-ARM 1-60 MINUTES  Fluoroscopy was utilized by the requesting physician.  No radiographic  interpretation.     EKG: Orders placed or performed in visit on 02/24/14  . EKG 12-Lead     Hospital Course: Patient was admitted to [facility] and taken to the OR and underwent the above state procedure without  complications.  Patient tolerated the procedure well and was later transferred to the recovery room and then to the orthopaedic floor for postoperative care.  They were given PO and IV analgesics for pain control following their surgery.  They were given 24 hours of postoperative antibiotics of  Anti-infectives    Start     Dose/Rate Route Frequency Ordered Stop   11/03/14 1400  ceFAZolin (ANCEF) IVPB 2 g/50 mL premix     2 g 100 mL/hr over 30 Minutes Intravenous Every 6 hours 11/03/14 1231 11/03/14 2119   11/03/14 0638  ceFAZolin (ANCEF) IVPB 2 g/50 mL premix     2 g 100 mL/hr over 30 Minutes Intravenous On call to O.R. 11/03/14 [id number] 11/03/14 0847     and started on DVT prophylaxis in the form of Eliquis.   PT and OT were ordered for total hip protocol.  The patient was allowed to be WBAT with therapy. Discharge planning was consulted to help with postop disposition and equipment needs.  Patient had a fair night on the evening of surgery.  They started to get up OOB with therapy on day one.  Hemovac drain was pulled without difficulty.  The knee immobilizer was removed and discontinued.  Continued to work with therapy into day two.  Dressing was changed on day two and the incision was clean and dry.  Incision was healing well.  Patient was seen in rounds and was ready to go to SNF.   Diet: Diabetic diet Activity:WBAT No bending hip over 90 degrees- A "L" Angle Do not cross legs Do not let foot roll inward When turning these patients a pillow should be placed between the patient's legs to prevent crossing. Patients should have the affected knee fully extended when trying to sit or stand from all surfaces to prevent excessive hip flexion. When ambulating and turning toward the affected side the affected leg should have the toes turned out prior to moving the walker and the rest of patient's body as to prevent internal rotation/ turning in of the leg. Abduction pillows are the most effective way  to prevent a patient from not crossing legs or turning toes in at rest. If an abduction pillow is not ordered placing a regular pillow length wise between the patient's legs is also an effective reminder. It is imperative that these precautions be maintained so that the surgical hip does not dislocate. Follow-up:in 1 week Disposition - Skilled nursing facility Discharged Condition: stable   Discharge Instructions    Call MD / Call 911    Complete by:  As directed   If you experience chest pain or shortness of breath, CALL 911 and be transported to the hospital emergency room.  If you develope a fever above 101 F, pus (white drainage) or increased drainage or redness at the wound, or calf pain, call your surgeon's office.     Constipation Prevention    Complete by:  As directed   Drink plenty of fluids.  Prune juice may be helpful.  You may use a stool softener, such as Colace (over the counter) 100 mg twice a day.  Use MiraLax (over the counter) for constipation as needed.     Diet Carb Modified  Complete by:  As directed      Discharge instructions    Complete by:  As directed   INSTRUCTIONS AFTER JOINT REPLACEMENT   DRESSING / WOUND CARE / SHOWERING You may change your dressing 3-5 days after surgery.  Then change the dressing every day with sterile gauze.  Please use good hand washing techniques before changing the dressing.  Do   Increase activity slowly as tolerated, but follow the weight bearing instructions below.    WEIGHT BEARING  Weight bearing as tolerated with assist device (walker, cane, etc) as directed, use it as long as suggested by your surgeon or therapist, typically at least 4-6 weeks.     Increase activity slowly as tolerated    Complete by:  As directed             Medication List    STOP taking these medications        aspirin EC 81 MG tablet     ibuprofen 200 MG tablet  Commonly known as:  ADVIL,MOTRIN     naproxen sodium 220 MG tablet  Commonly  known as:  ANAPROX      TAKE these medications        apixaban 2.5 MG Tabs tablet  Commonly known as:  ELIQUIS  Take 1 tablet (2.5 mg total) by mouth every 12 (twelve) hours.     atorvastatin 10 MG tablet  Commonly known as:  LIPITOR  Take 10 mg by mouth daily.     benazepril-hydrochlorthiazide 10-12.5 MG per tablet  Commonly known as:  LOTENSIN HCT  Take 1 tablet by mouth daily.     meclizine 25 MG tablet  Commonly known as:  ANTIVERT  Take 25 mg by mouth 3 (three) times daily as needed for dizziness.     metFORMIN 850 MG tablet  Commonly known as:  GLUCOPHAGE  Take 850 mg by mouth 2 (two) times daily with a meal.     methocarbamol 500 MG tablet  Commonly known as:  ROBAXIN  Take 1 tablet (500 mg total) by mouth every 6 (six) hours as needed for muscle spasms.     oxyCODONE 5 MG immediate release tablet  Commonly known as:  Oxy IR/ROXICODONE  Take 1-2 tablets (5-10 mg total) by mouth every 3 (three) hours as needed for breakthrough pain.     traMADol 50 MG tablet  Commonly known as:  ULTRAM  Take 1-2 tablets (50-100 mg total) by mouth every 6 (six) hours as needed for moderate pain.           Follow-up Information    Follow up with Gearlean Alf, MD. Schedule an appointment as soon as possible for a visit on 11/11/2014.   Specialty:  Orthopedic Surgery   Why:  Call [phone] tomorrow to make the appointment   Contact information:   [address] [place] [address] [phone]       Signed: Ardeen Jourdain, PA-C Orthopaedic Surgery 11/05/2014, 8:06 AM

## 2014-11-05 NOTE — Clinical Social Work Placement (Signed)
   CLINICAL SOCIAL WORK PLACEMENT  NOTE  Date:  11/05/2014  Patient Details  Name: [name] MRN: [id number] Date of Birth: 14-Jan-1930  Clinical Social Work is seeking post-discharge placement for this patient at the [place] level of care (*CSW will initial, date and re-position this form in  chart as items are completed):  No   Patient/family provided with [place] Work Department's list of facilities offering this level of care within the geographic area requested by the patient (or if unable, by the patient's family).  Yes   Patient/family informed of their freedom to choose among providers that offer the needed level of care, that participate in Medicare, Medicaid or managed care program needed by the patient, have an available bed and are willing to accept the patient.  Yes   Patient/family informed of [place]'s ownership interest in [facility] and [facility], as well as of the fact that they are under no obligation to receive care at these facilities.  PASRR submitted to EDS on 11/04/14     PASRR number received on 11/04/14     Existing PASRR number confirmed on       FL2 transmitted to all facilities in geographic area requested by pt/family on 11/04/14     FL2 transmitted to all facilities within larger geographic area on       Patient informed that his/her managed care company has contracts with or will negotiate with certain facilities, including the following:        Yes   Patient/family informed of bed offers received.  Patient chooses bed at [facility]     Physician recommends and patient chooses bed at      Patient to be transferred to [facility] on 11/05/14.  Patient to be transferred to facility by car     Patient family notified on 11/05/14 of transfer.  Name of family member notified:  Pt called family directly.     PHYSICIAN       Additional Comment: Pt / family are in agreement with d/c  to [facility] today. PT approved transport by car. NSG reviewed d/c summary, scripts, AVS. Scripts included in d/c packet. D/C summary sent to SNF prior to d/c for review. D/C packet provided to pt by nsg prior to d/c.     _______________________________________________ Luretha Rued, [place] 11/05/2014, 12:34 PM

## 2014-11-05 NOTE — Progress Notes (Signed)
Pulled 10mg  of oxycodone for patient in AM, but patient refused. Will return to pharmacy now.

## 2014-11-05 NOTE — Progress Notes (Signed)
   Subjective: 2 Days Post-Op Procedure(s) (LRB): LEFT TOTAL HIP ARTHROPLASTY ANTERIOR APPROACH (Left) Patient reports pain as mild.   Patient seen in rounds with Dr. Wynelle Link. Patient is well, and has had no acute complaints or problems. She is feeling a little better this morning than she was yesterday. No SOB or chest pain.    Objective: Vital signs in last 24 hours: Temp:  [97.7 F (36.5 C)-98.4 F (36.9 C)] 98.2 F (36.8 C) (09/23 0555) Pulse Rate:  [61-72] 68 (09/23 0555) Resp:  [15-16] 16 (09/23 0555) BP: (143-154)/(54-63) 148/62 mmHg (09/23 0555) SpO2:  [93 %-100 %] 98 % (09/23 0555)  Intake/Output from previous day:  Intake/Output Summary (Last 24 hours) at 11/05/14 0804 Last data filed at 11/05/14 0600  Gross per 24 hour  Intake 2647.5 ml  Output   1475 ml  Net 1172.5 ml     Labs:  Recent Labs  11/04/14 0516 11/05/14 0447  HGB 10.6* 10.0*    Recent Labs  11/04/14 0516 11/05/14 0447  WBC 8.7 9.9  RBC 3.28* 3.08*  HCT 31.5* 29.7*  PLT 163 254    Recent Labs  11/04/14 0516 11/05/14 0447  NA 137 142  K 4.1 3.6  CL 106 107  CO2 24 24  BUN 15 22*  CREATININE 0.99 1.13*  GLUCOSE 134* 130*  CALCIUM 8.5* 8.5*    EXAM General - Patient is Alert and Oriented Extremity - Neurologically intact Intact pulses distally Dorsiflexion/Plantar flexion intact Compartment soft Dressing/Incision - clean, dry, no drainage Motor Function - intact, moving foot and toes well on exam.   Past Medical History  Diagnosis Date  . Hypertension   . Hyperlipidemia   . Diabetes mellitus   . CAD (coronary artery disease)     Asymptomatic  . Arthritis   . History of shingles     ON BACK  . Cancer   . History of breast cancer 2001  . Vertigo     Assessment/Plan: 2 Days Post-Op Procedure(s) (LRB): LEFT TOTAL HIP ARTHROPLASTY ANTERIOR APPROACH (Left) Principal Problem:   OA (osteoarthritis) of hip  Estimated body mass index is 28.65 kg/(m^2) as calculated  from the following:   Height as of this encounter: 5\' 4"  (1.626 m).   Weight as of this encounter: 75.751 kg (167 lb). Advance diet Up with therapy D/C IV fluids Discharge to SNF  DVT Prophylaxis - Eliquis Weight Bearing As Tolerated   Continue PT today. Plan for DC to SNF today. Follow up in office next Thursday  Ardeen Jourdain, PA-C Orthopaedic Surgery 11/05/2014, 8:04 AM

## 2014-11-05 NOTE — Progress Notes (Signed)
Physical Therapy Treatment Patient Details Name: [name] MRN: [id number] DOB: [birth year] Today's Date: 11/05/2014    History of Present Illness 81 yo female s/p L THA 11/03/14    PT Comments    Progressing with mobility. Ready to d/c from PT standpoint.   Follow Up Recommendations  SNF     Equipment Recommendations  None recommended by PT    Recommendations for Other Services OT consult     Precautions / Restrictions Precautions Precautions: Fall Restrictions Weight Bearing Restrictions: No LLE Weight Bearing: Weight bearing as tolerated    Mobility  Bed Mobility Overal bed mobility: Needs Assistance Bed Mobility: Supine to Sit     Supine to sit: Min assist     General bed mobility comments: assist for L LE and to scoot to EOB  Transfers Overall transfer level: Needs assistance Equipment used: Rolling walker (2 wheeled) Transfers: Sit to/from Stand Sit to Stand: Min guard         General transfer comment: close guard for safety. VCs safety, hand placement  Ambulation/Gait Ambulation/Gait assistance: Min guard Ambulation Distance (Feet): 70 Feet Assistive device: Rolling walker (2 wheeled) Gait Pattern/deviations: Step-to pattern;Step-through pattern;Antalgic     General Gait Details: close guard for safety VCs safety, technique, sequence. fatigues fairly easily still. slow gait speed   Stairs            Wheelchair Mobility    Modified Rankin (Stroke Patients Only)       Balance                                    Cognition Arousal/Alertness: Awake/alert Behavior During Therapy: WFL for tasks assessed/performed Overall Cognitive Status: Within Functional Limits for tasks assessed                      Exercises Total Joint Exercises Ankle Circles/Pumps: AROM;Both;10 reps;Supine Quad Sets: AROM;Both;10 reps;Supine Heel Slides: AAROM;Left;10 reps;Supine Hip ABduction/ADduction: AAROM;Left;10  reps;Supine    General Comments        Pertinent Vitals/Pain Pain Assessment: 0-10 Pain Score: 5  Pain Location: L hip with activity Pain Descriptors / Indicators: Burning;Sore Pain Intervention(s): Monitored during session;Ice applied;Repositioned    Home Living                      Prior Function            PT Goals (current goals can now be found in the care plan section) Progress towards PT goals: Progressing toward goals    Frequency  7X/week    PT Plan Current plan remains appropriate    Co-evaluation             End of Session Equipment Utilized During Treatment: Gait belt Activity Tolerance: Patient tolerated treatment well Patient left: in chair;with call bell/phone within reach     Time: 1009-1026 PT Time Calculation (min) (ACUTE ONLY): 17 min  Charges:  $Gait Training: 8-22 mins                    G Codes:      Weston Anna, MPT Pager: [phone]

## 2014-11-05 NOTE — Care Management Important Message (Signed)
Important Message  Patient Details  Name: [name] MRN: [id number] Date of Birth: 11/17/1929   Medicare Important Message Given:  Yes-second notification given    Camillo Flaming 11/05/2014, 11:44 AMImportant Message  Patient Details  Name: [name] MRN: [id number] Date of Birth: October 27, 1929   Medicare Important Message Given:  Yes-second notification given    Camillo Flaming 11/05/2014, 11:44 AM

## 2014-11-06 LAB — GLUCOSE, CAPILLARY: GLUCOSE-CAPILLARY: 100 mg/dL — AB (ref 65–99)

## 2014-11-07 LAB — GLUCOSE, CAPILLARY
GLUCOSE-CAPILLARY: 137 mg/dL — AB (ref 65–99)
GLUCOSE-CAPILLARY: 133 mg/dL — AB (ref 65–99)
Glucose-Capillary: 130 mg/dL — ABNORMAL HIGH (ref 65–99)
Glucose-Capillary: 123 mg/dL — ABNORMAL HIGH (ref 65–99)
Glucose-Capillary: 142 mg/dL — ABNORMAL HIGH (ref 65–99)

## 2014-11-08 DIAGNOSIS — I10 Essential (primary) hypertension: Secondary | ICD-10-CM | POA: Diagnosis not present

## 2014-11-08 DIAGNOSIS — I2510 Atherosclerotic heart disease of native coronary artery without angina pectoris: Secondary | ICD-10-CM | POA: Diagnosis not present

## 2014-11-08 DIAGNOSIS — M159 Polyosteoarthritis, unspecified: Secondary | ICD-10-CM | POA: Diagnosis not present

## 2014-11-08 LAB — GLUCOSE, CAPILLARY
GLUCOSE-CAPILLARY: 112 mg/dL — AB (ref 65–99)
GLUCOSE-CAPILLARY: 97 mg/dL (ref 65–99)
GLUCOSE-CAPILLARY: 135 mg/dL — AB (ref 65–99)
Glucose-Capillary: 120 mg/dL — ABNORMAL HIGH (ref 65–99)

## 2014-11-09 LAB — GLUCOSE, CAPILLARY
GLUCOSE-CAPILLARY: 103 mg/dL — AB (ref 65–99)
GLUCOSE-CAPILLARY: 146 mg/dL — AB (ref 65–99)
Glucose-Capillary: 93 mg/dL (ref 65–99)

## 2014-11-10 LAB — GLUCOSE, CAPILLARY
GLUCOSE-CAPILLARY: 113 mg/dL — AB (ref 65–99)
GLUCOSE-CAPILLARY: 105 mg/dL — AB (ref 65–99)

## 2014-11-11 DIAGNOSIS — Z96642 Presence of left artificial hip joint: Secondary | ICD-10-CM | POA: Diagnosis not present

## 2014-11-11 DIAGNOSIS — Z471 Aftercare following joint replacement surgery: Secondary | ICD-10-CM | POA: Diagnosis not present

## 2014-11-11 LAB — GLUCOSE, CAPILLARY
GLUCOSE-CAPILLARY: 112 mg/dL — AB (ref 65–99)
Glucose-Capillary: 138 mg/dL — ABNORMAL HIGH (ref 65–99)

## 2014-11-12 LAB — GLUCOSE, CAPILLARY: GLUCOSE-CAPILLARY: 114 mg/dL — AB (ref 65–99)

## 2014-11-13 DIAGNOSIS — I10 Essential (primary) hypertension: Secondary | ICD-10-CM | POA: Diagnosis not present

## 2014-11-13 DIAGNOSIS — E119 Type 2 diabetes mellitus without complications: Secondary | ICD-10-CM | POA: Diagnosis not present

## 2014-11-13 DIAGNOSIS — Z96642 Presence of left artificial hip joint: Secondary | ICD-10-CM | POA: Diagnosis not present

## 2014-11-13 DIAGNOSIS — M1990 Unspecified osteoarthritis, unspecified site: Secondary | ICD-10-CM | POA: Diagnosis not present

## 2014-11-13 DIAGNOSIS — Z471 Aftercare following joint replacement surgery: Secondary | ICD-10-CM | POA: Diagnosis not present

## 2014-11-16 DIAGNOSIS — I10 Essential (primary) hypertension: Secondary | ICD-10-CM | POA: Diagnosis not present

## 2014-11-16 DIAGNOSIS — Z96642 Presence of left artificial hip joint: Secondary | ICD-10-CM | POA: Diagnosis not present

## 2014-11-16 DIAGNOSIS — E119 Type 2 diabetes mellitus without complications: Secondary | ICD-10-CM | POA: Diagnosis not present

## 2014-11-16 DIAGNOSIS — Z471 Aftercare following joint replacement surgery: Secondary | ICD-10-CM | POA: Diagnosis not present

## 2014-11-16 DIAGNOSIS — M1990 Unspecified osteoarthritis, unspecified site: Secondary | ICD-10-CM | POA: Diagnosis not present

## 2014-11-18 DIAGNOSIS — E119 Type 2 diabetes mellitus without complications: Secondary | ICD-10-CM | POA: Diagnosis not present

## 2014-11-18 DIAGNOSIS — Z96642 Presence of left artificial hip joint: Secondary | ICD-10-CM | POA: Diagnosis not present

## 2014-11-18 DIAGNOSIS — M1990 Unspecified osteoarthritis, unspecified site: Secondary | ICD-10-CM | POA: Diagnosis not present

## 2014-11-18 DIAGNOSIS — Z471 Aftercare following joint replacement surgery: Secondary | ICD-10-CM | POA: Diagnosis not present

## 2014-11-18 DIAGNOSIS — I10 Essential (primary) hypertension: Secondary | ICD-10-CM | POA: Diagnosis not present

## 2014-11-19 DIAGNOSIS — E119 Type 2 diabetes mellitus without complications: Secondary | ICD-10-CM | POA: Diagnosis not present

## 2014-11-19 DIAGNOSIS — Z96642 Presence of left artificial hip joint: Secondary | ICD-10-CM | POA: Diagnosis not present

## 2014-11-19 DIAGNOSIS — Z471 Aftercare following joint replacement surgery: Secondary | ICD-10-CM | POA: Diagnosis not present

## 2014-11-19 DIAGNOSIS — M1990 Unspecified osteoarthritis, unspecified site: Secondary | ICD-10-CM | POA: Diagnosis not present

## 2014-11-19 DIAGNOSIS — I10 Essential (primary) hypertension: Secondary | ICD-10-CM | POA: Diagnosis not present

## 2014-11-24 DIAGNOSIS — I10 Essential (primary) hypertension: Secondary | ICD-10-CM | POA: Diagnosis not present

## 2014-11-24 DIAGNOSIS — Z471 Aftercare following joint replacement surgery: Secondary | ICD-10-CM | POA: Diagnosis not present

## 2014-11-24 DIAGNOSIS — M1990 Unspecified osteoarthritis, unspecified site: Secondary | ICD-10-CM | POA: Diagnosis not present

## 2014-11-24 DIAGNOSIS — E119 Type 2 diabetes mellitus without complications: Secondary | ICD-10-CM | POA: Diagnosis not present

## 2014-11-24 DIAGNOSIS — Z96642 Presence of left artificial hip joint: Secondary | ICD-10-CM | POA: Diagnosis not present

## 2014-11-26 DIAGNOSIS — Z471 Aftercare following joint replacement surgery: Secondary | ICD-10-CM | POA: Diagnosis not present

## 2014-11-26 DIAGNOSIS — Z96642 Presence of left artificial hip joint: Secondary | ICD-10-CM | POA: Diagnosis not present

## 2014-11-26 DIAGNOSIS — M1990 Unspecified osteoarthritis, unspecified site: Secondary | ICD-10-CM | POA: Diagnosis not present

## 2014-11-26 DIAGNOSIS — I10 Essential (primary) hypertension: Secondary | ICD-10-CM | POA: Diagnosis not present

## 2014-11-26 DIAGNOSIS — E119 Type 2 diabetes mellitus without complications: Secondary | ICD-10-CM | POA: Diagnosis not present

## 2014-12-07 DIAGNOSIS — Z96642 Presence of left artificial hip joint: Secondary | ICD-10-CM | POA: Diagnosis not present

## 2014-12-07 DIAGNOSIS — Z471 Aftercare following joint replacement surgery: Secondary | ICD-10-CM | POA: Diagnosis not present

## 2015-01-03 DIAGNOSIS — I10 Essential (primary) hypertension: Secondary | ICD-10-CM | POA: Diagnosis not present

## 2015-01-03 DIAGNOSIS — E785 Hyperlipidemia, unspecified: Secondary | ICD-10-CM | POA: Diagnosis not present

## 2015-01-11 DIAGNOSIS — Z96642 Presence of left artificial hip joint: Secondary | ICD-10-CM | POA: Diagnosis not present

## 2015-01-11 DIAGNOSIS — Z471 Aftercare following joint replacement surgery: Secondary | ICD-10-CM | POA: Diagnosis not present

## 2015-01-18 ENCOUNTER — Other Ambulatory Visit: Payer: Self-pay

## 2015-01-18 DIAGNOSIS — Z1231 Encounter for screening mammogram for malignant neoplasm of breast: Secondary | ICD-10-CM

## 2015-02-15 ENCOUNTER — Ambulatory Visit
Admission: RE | Admit: 2015-02-15 | Discharge: 2015-02-15 | Disposition: A | Discharge status: Home / Self Care | Payer: Medicare Other | Source: Ambulatory Visit

## 2015-02-15 DIAGNOSIS — Z1231 Encounter for screening mammogram for malignant neoplasm of breast: Secondary | ICD-10-CM | POA: Diagnosis not present

## 2015-03-08 DIAGNOSIS — D485 Neoplasm of uncertain behavior of skin: Secondary | ICD-10-CM | POA: Diagnosis not present

## 2015-03-08 DIAGNOSIS — Z85828 Personal history of other malignant neoplasm of skin: Secondary | ICD-10-CM | POA: Diagnosis not present

## 2015-03-08 DIAGNOSIS — L821 Other seborrheic keratosis: Secondary | ICD-10-CM | POA: Diagnosis not present

## 2015-03-08 DIAGNOSIS — D0472 Carcinoma in situ of skin of left lower limb, including hip: Secondary | ICD-10-CM | POA: Diagnosis not present

## 2015-03-08 DIAGNOSIS — X32XXXA Exposure to sunlight, initial encounter: Secondary | ICD-10-CM | POA: Diagnosis not present

## 2015-03-08 DIAGNOSIS — L570 Actinic keratosis: Secondary | ICD-10-CM | POA: Diagnosis not present

## 2015-03-17 DIAGNOSIS — D0472 Carcinoma in situ of skin of left lower limb, including hip: Secondary | ICD-10-CM | POA: Diagnosis not present

## 2015-03-29 ENCOUNTER — Encounter: Payer: Self-pay | Admitting: Primary Care

## 2015-03-29 ENCOUNTER — Ambulatory Visit (INDEPENDENT_AMBULATORY_CARE_PROVIDER_SITE_OTHER): Payer: Medicare Other | Admitting: Primary Care

## 2015-03-29 VITALS — BP 126/72 | HR 71 | Temp 97.8°F | Ht 64.0 in | Wt 163.8 lb

## 2015-03-29 DIAGNOSIS — I10 Essential (primary) hypertension: Secondary | ICD-10-CM

## 2015-03-29 DIAGNOSIS — E119 Type 2 diabetes mellitus without complications: Secondary | ICD-10-CM

## 2015-03-29 DIAGNOSIS — E785 Hyperlipidemia, unspecified: Secondary | ICD-10-CM

## 2015-03-29 DIAGNOSIS — I499 Cardiac arrhythmia, unspecified: Secondary | ICD-10-CM

## 2015-03-29 DIAGNOSIS — I498 Other specified cardiac arrhythmias: Secondary | ICD-10-CM

## 2015-03-29 MED ORDER — BENAZEPRIL-HYDROCHLOROTHIAZIDE 10-12.5 MG PO TABS: 1.0000 | ORAL_TABLET | Freq: Every day | ORAL | Status: DC

## 2015-03-29 MED ORDER — ATORVASTATIN CALCIUM 10 MG PO TABS: 10.0000 mg | ORAL_TABLET | Freq: Every day | ORAL | Status: DC

## 2015-03-29 MED ORDER — METFORMIN HCL 850 MG PO TABS: 850.0000 mg | ORAL_TABLET | Freq: Two times a day (BID) | ORAL | Status: DC

## 2015-03-29 NOTE — Progress Notes (Signed)
Pre visit review using our clinic review tool, if applicable. No additional management support is needed unless otherwise documented below in the visit note. 

## 2015-03-29 NOTE — Assessment & Plan Note (Signed)
Noted on exam today. Asymptomatic.

## 2015-03-29 NOTE — Assessment & Plan Note (Signed)
Unsure of last A1C result, but last draw was in December 2016. Managed on Metformin 850 mg BID. Will recheck A1C during her upcoming physical. Will also complete urine microalbumin and review records for pneumonia vaccinations.

## 2015-03-29 NOTE — Progress Notes (Signed)
Subjective:    Patient ID: [name], female    DOB: 22-Jul-1929, 82 y.o.   MRN: [id number]  HPI  [name] is an 82 year old female who presents today to establish care and discuss the problems mentioned below. Will obtain old records. Her last physical was years ago.   1) Hyperlipidemia: Diagnosed years ago. Currently managed on atorvastatin 10 mg. Last lipid panel was before Christmas. She's unsure of the results of her last lipid panel.  2) Essential Hypertension: Diagnosed years ago. Currently managed on benazepril-HCTZ 10/12.5 mg. Denies chest pain, dizziness, shortness of breath.   3) Type 2 Diabetes: Diagnosed years ago. Currently managed on Metformin 850 mg BID. She endorses her last A1C was before Christmas and is unsure of her levels. Denies numbness/tingling to feet and hands. She does notice some urgency of bowel movements about 2 hours after her morning Metformin dose.   Review of Systems  Constitutional: Negative for unexpected weight change.  HENT: Negative for rhinorrhea.   Respiratory: Negative for cough and shortness of breath.   Cardiovascular: Negative for chest pain.  Gastrointestinal: Negative for diarrhea and constipation.  Genitourinary: Negative for difficulty urinating.  Musculoskeletal:       Chronic arthritis to shoulder, hip, and bilateral knees  Skin: Negative for rash.       Skin cancer removed from left dorsal foot.  Neurological: Negative for dizziness, numbness and headaches.  Psychiatric/Behavioral:       Denies concerns for anxiety or depression       Past Medical History  Diagnosis Date  . Hypertension   . Hyperlipidemia   . Diabetes mellitus ([place])   . CAD (coronary artery disease)     Asymptomatic  . Arthritis   . History of shingles     ON BACK  . Breast cancer ([place]) 2001  . Vertigo     Social History   Social History  . Marital Status: Widowed    Spouse Name: N/A  . Number of Children: N/A  . Years of Education: N/A    Occupational History  . Not on file.   Social History Main Topics  . Smoking status: Never Smoker   . Smokeless tobacco: Not on file  . Alcohol Use: No  . Drug Use: No  . Sexual Activity: Not on file   Other Topics Concern  . Not on file   Social History Narrative   Widow.    Lives by herself at home.   Volunteers at [employer].   Enjoys volunteering, helping her neighbors, spending time with family, church.    Past Surgical History  Procedure Laterality Date  . Cardiac catheterization  07/13/2009    Recommendation - medical therapy  . Transthoracic echocardiogram  06/16/2009    EF >55%, no significant valvular abnormalities.  . Cardiovascular stress test  06/16/2009    Mild ischemia demonstrated in the basal inferior, mid inferior, and apical inferior regions.  . Breast lumpectomy with axillary lymph node dissection  2003    left  . Cholecystectomy    . Joint replacement      BILATERAL TOTAL KNEES  . Cataracts removed    . Total hip arthroplasty Left 11/03/2014    Procedure: LEFT TOTAL HIP ARTHROPLASTY ANTERIOR APPROACH;  Surgeon: Gaynelle Arabian, MD;  Location: [facility];  Service: Orthopedics;  Laterality: Left;    Family History  Problem Relation Age of Onset  . Heart attack Mother   . Cancer Mother   . Stroke Father   .  Stroke Brother   . Stroke Brother     No Known Allergies  Current Outpatient Prescriptions on File Prior to Visit  Medication Sig Dispense Refill  . meclizine (ANTIVERT) 25 MG tablet Take 25 mg by mouth 3 (three) times daily as needed for dizziness.      No current facility-administered medications on file prior to visit.    BP 126/72 mmHg  Pulse 71  Temp(Src) 97.8 F (36.6 C) (Oral)  Ht 5\' 4"  (1.626 m)  Wt 163 lb 12.8 oz (74.299 kg)  BMI 28.10 kg/m2  SpO2 98%    Objective:   Physical Exam  Constitutional: She is oriented to person, place, and time. She appears well-nourished.  Cardiovascular: Normal rate and regular rhythm.    Pulmonary/Chest: Effort normal and breath sounds normal.  Neurological: She is alert and oriented to person, place, and time.  Skin: Skin is warm and dry.  Psychiatric: She has a normal mood and affect.          Assessment & Plan:

## 2015-03-29 NOTE — Assessment & Plan Note (Signed)
Stable on benazepril-HCTZ regimen. Continue same. Will review records for BMP.

## 2015-03-29 NOTE — Patient Instructions (Signed)
I sent refills of your medication to your pharmacy.  Please schedule a physical with me within the next 3 months. You may also schedule a lab only appointment 3-4 days prior. We will discuss your lab results in detail during your physical.  It was a pleasure to meet you today! Please don't hesitate to call me with any questions. Welcome to [facility]!

## 2015-03-29 NOTE — Assessment & Plan Note (Signed)
Managed on lipitor 10 mg. She is unsure of her last lipid panel result. Will recheck at upcoming physical.

## 2015-03-30 ENCOUNTER — Encounter: Payer: Self-pay | Admitting: Primary Care

## 2015-03-30 ENCOUNTER — Telehealth: Payer: Self-pay | Admitting: Primary Care

## 2015-03-30 NOTE — Telephone Encounter (Signed)
Please notify [name] that i have reviewed her records and they are ready for pick up at her convenience.

## 2015-03-31 NOTE — Telephone Encounter (Signed)
Message left for patient to return my call.  

## 2015-03-31 NOTE — Telephone Encounter (Signed)
Pt aware.

## 2015-04-12 DIAGNOSIS — Z471 Aftercare following joint replacement surgery: Secondary | ICD-10-CM | POA: Diagnosis not present

## 2015-04-12 DIAGNOSIS — Z96642 Presence of left artificial hip joint: Secondary | ICD-10-CM | POA: Diagnosis not present

## 2015-06-24 ENCOUNTER — Other Ambulatory Visit (INDEPENDENT_AMBULATORY_CARE_PROVIDER_SITE_OTHER): Payer: Medicare Other

## 2015-06-24 ENCOUNTER — Telehealth: Payer: Self-pay | Admitting: Family Medicine

## 2015-06-24 DIAGNOSIS — E119 Type 2 diabetes mellitus without complications: Secondary | ICD-10-CM

## 2015-06-24 LAB — COMPREHENSIVE METABOLIC PANEL
ALBUMIN: 4.1 g/dL (ref 3.5–5.2)
ALK PHOS: 111 U/L (ref 39–117)
ALT: 21 U/L (ref 0–35)
AST: 21 U/L (ref 0–37)
BILIRUBIN TOTAL: 0.8 mg/dL (ref 0.2–1.2)
BUN: 22 mg/dL (ref 6–23)
CALCIUM: 9.6 mg/dL (ref 8.4–10.5)
CO2: 26 mEq/L (ref 19–32)
Chloride: 105 mEq/L (ref 96–112)
Creatinine, Ser: 1.23 mg/dL — ABNORMAL HIGH (ref 0.40–1.20)
GFR: 44.24 mL/min — AB (ref >60.00)
GLUCOSE: 137 mg/dL — AB (ref 70–99)
Potassium: 4 mEq/L (ref 3.5–5.1)
Sodium: 140 mEq/L (ref 135–145)
TOTAL PROTEIN: 6.5 g/dL (ref 6.0–8.3)

## 2015-06-24 LAB — LIPID PANEL
CHOL/HDL RATIO: 3
Cholesterol: 155 mg/dL (ref 0–200)
HDL: 45.5 mg/dL (ref >39.00)
LDL Cholesterol: 80 mg/dL (ref 0–99)
NonHDL: 109.69
TRIGLYCERIDES: 148 mg/dL (ref 0.0–149.0)
VLDL: 29.6 mg/dL (ref 0.0–40.0)

## 2015-06-24 LAB — HEMOGLOBIN A1C: HEMOGLOBIN A1C: 6.6 % — AB (ref 4.6–6.5)

## 2015-06-24 NOTE — Telephone Encounter (Signed)
-----   Message from Ellamae Sia sent at 06/20/2015  3:07 PM EDT ----- Regarding: Lab orders for Friday, 5.12.17 Patient is scheduled for CPX labs, please order future labs, Thanks , Karna Christmas   Kate's pt

## 2015-06-28 ENCOUNTER — Encounter: Payer: Medicare Other | Admitting: Primary Care

## 2015-06-30 ENCOUNTER — Encounter: Payer: Self-pay | Admitting: Primary Care

## 2015-06-30 ENCOUNTER — Ambulatory Visit (INDEPENDENT_AMBULATORY_CARE_PROVIDER_SITE_OTHER): Payer: Medicare Other | Admitting: Primary Care

## 2015-06-30 VITALS — BP 118/70 | HR 67 | Temp 98.0°F | Ht 64.0 in | Wt 163.0 lb

## 2015-06-30 DIAGNOSIS — E119 Type 2 diabetes mellitus without complications: Secondary | ICD-10-CM | POA: Diagnosis not present

## 2015-06-30 DIAGNOSIS — Z0000 Encounter for general adult medical examination without abnormal findings: Secondary | ICD-10-CM | POA: Insufficient documentation

## 2015-06-30 DIAGNOSIS — I10 Essential (primary) hypertension: Secondary | ICD-10-CM | POA: Diagnosis not present

## 2015-06-30 DIAGNOSIS — E785 Hyperlipidemia, unspecified: Secondary | ICD-10-CM | POA: Diagnosis not present

## 2015-06-30 DIAGNOSIS — Z23 Encounter for immunization: Secondary | ICD-10-CM | POA: Diagnosis not present

## 2015-06-30 DIAGNOSIS — M810 Age-related osteoporosis without current pathological fracture: Secondary | ICD-10-CM

## 2015-06-30 MED ORDER — ZOSTER VACCINE LIVE 19400 UNT/0.65ML ~~LOC~~ SUSR: 0.6500 mL | Freq: Once | SUBCUTANEOUS | Status: DC

## 2015-06-30 MED ORDER — METFORMIN HCL 850 MG PO TABS: ORAL_TABLET | ORAL | Status: DC

## 2015-06-30 NOTE — Assessment & Plan Note (Signed)
A1C stable at 6.6. Will reduce Metformin to once daily dosing as she's experiencing diarrhea and A1C is stable.

## 2015-06-30 NOTE — Assessment & Plan Note (Signed)
Stable on atorvastatin. Continue same.

## 2015-06-30 NOTE — Patient Instructions (Addendum)
You've been provided with a pneumonia vaccination today.   Take the shingles vaccination to the pharmacy in 1 month. Please check with them first to ensure insurance will cover.  Reduce your Metformin to 1 tablet by mouth in the evening with dinner. Reduce consumption of sweets and junk food as this will cause your sugar levels to increase.  Continue to remain active as tolerated.  Please stop by the lab to leave a urine test. This will test for any kidney damage caused by diabetes.   Start taking Calcium with Vitamin D. Ensure you are getting 1200 mg of calcium and 800 units of vitamin D daily.  You will be contacted regarding your bone density testing.  Please let us know if you have not heard back within one week.   Follow up in 6 months for re-evaluation.  It was a pleasure to see you today!

## 2015-06-30 NOTE — Addendum Note (Signed)
Addended by: Jacqualin Combes on: 06/30/2015 01:16 PM   Modules accepted: Orders, SmartSet

## 2015-06-30 NOTE — Addendum Note (Signed)
Addended by: Ellamae Sia on: 06/30/2015 02:12 PM   Modules accepted: Orders

## 2015-06-30 NOTE — Progress Notes (Signed)
Patient ID: [name], female   DOB: 14-Jun-1929, 82 y.o.   MRN: [id number]   HPI: [name] is an 82 year old female who presents today for her annual medicare wellness visit.  Past Medical History  Diagnosis Date  . Hypertension   . Hyperlipidemia   . Diabetes mellitus ([place])   . CAD (coronary artery disease)     Asymptomatic  . Arthritis   . History of shingles     ON BACK  . Breast cancer ([place]) 2001  . Vertigo   . Hiatal hernia   . Diverticulosis   . PVC (premature ventricular contraction)     Current Outpatient Prescriptions  Medication Sig Dispense Refill  . aspirin 81 MG tablet Take 81 mg by mouth daily.    Marland Kitchen atorvastatin (LIPITOR) 10 MG tablet Take 1 tablet (10 mg total) by mouth daily. 90 tablet 2  . benazepril-hydrochlorthiazide (LOTENSIN HCT) 10-12.5 MG tablet Take 1 tablet by mouth daily. 90 tablet 2  . meclizine (ANTIVERT) 25 MG tablet Take 25 mg by mouth 3 (three) times daily as needed for dizziness.     . metFORMIN (GLUCOPHAGE) 850 MG tablet Take 1 tablet (850 mg total) by mouth 2 (two) times daily with a meal. 180 tablet 2   No current facility-administered medications for this visit.    No Known Allergies  Family History  Problem Relation Age of Onset  . Heart attack Mother   . Cancer Mother   . Stroke Father   . Stroke Brother   . Stroke Brother     Social History   Social History  . Marital Status: Widowed    Spouse Name: N/A  . Number of Children: N/A  . Years of Education: N/A   Occupational History  . Not on file.   Social History Main Topics  . Smoking status: Never Smoker   . Smokeless tobacco: Not on file  . Alcohol Use: No  . Drug Use: No  . Sexual Activity: Not on file   Other Topics Concern  . Not on file   Social History Narrative   Widow.    Lives by herself at home.   Volunteers at [employer].   Enjoys volunteering, helping her neighbors, spending time with family, church.    Hospitiliaztions: None  Health  Maintenance:    Flu: Did receive last season.  Tetanus: Unsure   Pneumovax: Never completed  Prevnar: Never completed, due  Zostavax: Never completed, has had shingles  Bone Density: Completed in 2005, osteoporosis from records.  Colonoscopy: Completed in 2003  Endoscopy: Completed in 2003.   Eye Doctor: Completed several years ago. Cataract removal years ago.  Dental Exam: Completed in 2017  Mammogram: Completed in January 2017.      Providers: Dorena Cookey; Dr. Luanna Salk, Cardiology; Dr. Evorn Gong, Dermatology; Alma Friendly, PCP; Dr. Gloriann Loan, Optometry   I have personally reviewed and have noted: 1. The patient's medical and social history 2. Their use of alcohol, tobacco or illicit drugs 3. Their current medications and supplements 4. The patient's functional ability including ADL's, fall risks, home safety risks and  hearing or visual impairment. 5. Diet and physical activities 6. Evidence for depression or mood disorder  Subjective:   Review of Systems:   Constitutional: Denies fever, malaise, fatigue, headache or abrupt weight changes.  HEENT: Denies eye pain, eye redness, ear pain, ringing in the ears, wax buildup, runny nose, nasal congestion, bloody nose, or sore throat. She does experience difficulty swallowing and choking  with certain foods (chicken, meat) for the past several years. Episodes occur once monthly on average. She had her esophagus stretched in 2003. Respiratory: Denies difficulty breathing, shortness of breath, cough or sputum production.   Cardiovascular: Denies chest pain, chest tightness, palpitations or swelling in the hands or feet.  Gastrointestinal: Denies abdominal pain, bloating, constipation, or blood in the stool. She does experience diarrhea 2 hours after taking her Metformin in the morning. Denies diarrhea in the evening.  GU: Denies urgency, frequency, pain with urination, burning sensation, blood in urine, odor or discharge. Musculoskeletal:  Denies decrease in range of motion, difficulty with gait, muscle pain or joint pain and swelling. Arthritis to right shoulder.  Skin: Denies redness, rashes, lesions or ulcercations.  Neurological: Denies difficulty with memory, difficulty with speech or problems with balance and coordination. Rare episodes of vertigo.   No other specific complaints in a complete review of systems (except as listed in HPI above).  Objective:  PE:   BP 118/70 mmHg  Pulse 67  Temp(Src) 98 F (36.7 C) (Oral)  Ht 5\' 4"  (1.626 m)  Wt 163 lb (73.936 kg)  BMI 27.97 kg/m2  SpO2 99% Wt Readings from Last 3 Encounters:  06/30/15 163 lb (73.936 kg)  03/29/15 163 lb 12.8 oz (74.299 kg)  11/03/14 167 lb (75.751 kg)    General: Appears their stated age, well developed, well nourished in NAD. Skin: Warm, dry and intact. No rashes, lesions or ulcerations noted. HEENT: Head: normal shape and size; Eyes: sclera white, no icterus, conjunctiva pink, PERRLA and EOMs intact; Ears: Tm's gray and intact, normal light reflex; Nose: mucosa pink and moist, septum midline; Throat/Mouth: Teeth present, mucosa pink and moist, no exudate, lesions or ulcerations noted.  Neck: Normal range of motion. Neck supple, trachea midline. No massses, lumps or thyromegaly present.  Cardiovascular: Normal rate and rhythm. S1,S2 noted.  No murmur, rubs or gallops noted. No JVD or BLE edema. No carotid bruits noted. Pulmonary/Chest: Normal effort and positive vesicular breath sounds. No respiratory distress. No wheezes, rales or ronchi noted.  Abdomen: Soft and nontender. Normal bowel sounds, no bruits noted. No distention or masses noted. Liver, spleen and kidneys non palpable. Musculoskeletal: Normal range of motion. No signs of joint swelling. No difficulty with gait.  Neurological: Alert and oriented. Cranial nerves II-XII intact. Coordination normal. +DTRs bilaterally. Psychiatric: Mood and affect normal. Behavior is normal. Judgment and  thought content normal.     BMET    Component Value Date/Time   NA 140 06/24/2015 0834   K 4.0 06/24/2015 0834   CL 105 06/24/2015 0834   CO2 26 06/24/2015 0834   GLUCOSE 137* 06/24/2015 0834   BUN 22 06/24/2015 0834   CREATININE 1.23* 06/24/2015 0834   CALCIUM 9.6 06/24/2015 0834   GFRNONAA 44* 11/05/2014 0447   GFRAA 51* 11/05/2014 0447    Lipid Panel     Component Value Date/Time   CHOL 155 06/24/2015 0834   TRIG 148.0 06/24/2015 0834   HDL 45.50 06/24/2015 0834   CHOLHDL 3 06/24/2015 0834   VLDL 29.6 06/24/2015 0834   LDLCALC 80 06/24/2015 0834    CBC    Component Value Date/Time   WBC 9.9 11/05/2014 0447   WBC 5.5 06/01/2010 0916   RBC 3.08* 11/05/2014 0447   RBC 3.79 06/01/2010 0916   HGB 10.0* 11/05/2014 0447   HGB 12.3 06/01/2010 0916   HCT 29.7* 11/05/2014 0447   HCT 36.4 06/01/2010 0916   PLT 254 11/05/2014 0447  PLT 329 06/01/2010 0916   MCV 96.4 11/05/2014 0447   MCV 96.0 06/01/2010 0916   MCH 32.5 11/05/2014 0447   MCH 32.5 06/01/2010 0916   MCHC 33.7 11/05/2014 0447   MCHC 33.9 06/01/2010 0916   RDW 13.9 11/05/2014 0447   RDW 14.0 06/01/2010 0916   LYMPHSABS 1.1 06/01/2010 0916   LYMPHSABS 1.4 02/08/2009 1941   MONOABS 0.4 06/01/2010 0916   MONOABS 0.6 02/08/2009 1941   EOSABS 0.1 06/01/2010 0916   EOSABS 0.1 02/08/2009 1941   BASOSABS 0.0 06/01/2010 0916   BASOSABS 0.0 02/08/2009 1941    Hgb A1C Lab Results  Component Value Date   HGBA1C 6.6* 06/24/2015      Assessment and Plan:   Medicare Annual Wellness Visit:  Diet: Endorses a fair diet. Breakfast: Peanut butter toast and coffee Lunch: Sandwich, chips Dinner: Vegetables, meat Snacks: Candy, fruit Desserts: Everyday Beverages: Coffee, water, occasional sodas and sweet tea Physical activity: She is active as a Psychologist, occupational in the hospital.  Depression/mood screen: Negative Hearing: Intact to whispered voice Visual acuity: Grossly normal, performs annual eye exam  ADLs:  Capable Fall risk: None Home safety: Good Cognitive evaluation: Intact to orientation, naming, recall and repetition EOL planning: Adv directives, full code/ I agree  Preventative Medicine: Patient doesn't believe she's ever had pneumonia vaccinations. Prevnar provided today. Prescription for Zostavax provided for her to obtain in 1 month at the pharmacy as she's never received. Bone density testing ordered as prior records indicated low bone density in 2005. She will start taking Calcium and Vitamin D for protection. Mammogram UTD. Does not wish to repeat colonoscopy which is reasonable given her age. Discussed the importance of a healthy diet and regular exercise in order for weight loss and to reduce risk of other medical diseases. Exam unremarkable. Labs stable.  Next appointment: 6 months for re-evaluation.

## 2015-06-30 NOTE — Assessment & Plan Note (Signed)
Patient doesn't believe she's ever had pneumonia vaccinations. Prevnar provided today. Prescription for Zostavax provided for her to obtain in 1 month at the pharmacy as she's never received. Bone density testing ordered as prior records indicated low bone density in 2005. She will start taking Calcium and Vitamin D for protection. Mammogram UTD. Does not wish to repeat colonoscopy which is reasonable given her age. Discussed the importance of a healthy diet and regular exercise in order for weight loss and to reduce risk of other medical diseases. Exam unremarkable. Labs stable.  I have personally reviewed and have noted: 1. The patient's medical and social history 2. Their use of alcohol, tobacco or illicit drugs 3. Their current medications and supplements 4. The patient's functional ability including ADL's, fall risks, home safety risks and  hearing or visual impairment. 5. Diet and physical activities 6. Evidence for depression or mood disorder  Follow up in 6 months.

## 2015-06-30 NOTE — Assessment & Plan Note (Signed)
Stable on current regimen   

## 2015-06-30 NOTE — Progress Notes (Signed)
Pre visit review using our clinic review tool, if applicable. No additional management support is needed unless otherwise documented below in the visit note. 

## 2015-07-21 ENCOUNTER — Ambulatory Visit
Admission: RE | Admit: 2015-07-21 | Discharge: 2015-07-21 | Disposition: A | Discharge status: Home / Self Care | Payer: Medicare Other | Source: Ambulatory Visit | Attending: Primary Care | Admitting: Primary Care

## 2015-07-21 DIAGNOSIS — M8589 Other specified disorders of bone density and structure, multiple sites: Secondary | ICD-10-CM | POA: Diagnosis not present

## 2015-07-21 DIAGNOSIS — Z13820 Encounter for screening for osteoporosis: Secondary | ICD-10-CM | POA: Diagnosis not present

## 2015-07-21 DIAGNOSIS — Z780 Asymptomatic menopausal state: Secondary | ICD-10-CM | POA: Diagnosis not present

## 2015-07-21 DIAGNOSIS — M8580 Other specified disorders of bone density and structure, unspecified site: Secondary | ICD-10-CM | POA: Diagnosis not present

## 2015-07-21 DIAGNOSIS — M810 Age-related osteoporosis without current pathological fracture: Secondary | ICD-10-CM

## 2015-08-17 DIAGNOSIS — L821 Other seborrheic keratosis: Secondary | ICD-10-CM | POA: Diagnosis not present

## 2015-08-17 DIAGNOSIS — X32XXXA Exposure to sunlight, initial encounter: Secondary | ICD-10-CM | POA: Diagnosis not present

## 2015-08-17 DIAGNOSIS — L570 Actinic keratosis: Secondary | ICD-10-CM | POA: Diagnosis not present

## 2015-08-17 DIAGNOSIS — Z85828 Personal history of other malignant neoplasm of skin: Secondary | ICD-10-CM | POA: Diagnosis not present

## 2015-08-17 DIAGNOSIS — Z08 Encounter for follow-up examination after completed treatment for malignant neoplasm: Secondary | ICD-10-CM | POA: Diagnosis not present

## 2015-08-29 ENCOUNTER — Telehealth: Payer: Self-pay

## 2015-08-29 NOTE — Telephone Encounter (Signed)
Pt wants to know status of request from [facility] for diabetic supply request; request was sent 2 weeks ago. Pt request cb. Do not see under media tab.

## 2015-08-29 NOTE — Telephone Encounter (Addendum)
Spoken to patient and gave patient the fax number to our office so the medical supplier could fax the order.   Faxed to [facility] as requested by patient.

## 2016-01-03 ENCOUNTER — Ambulatory Visit (INDEPENDENT_AMBULATORY_CARE_PROVIDER_SITE_OTHER): Payer: Medicare Other | Admitting: Primary Care

## 2016-01-03 ENCOUNTER — Encounter: Payer: Self-pay | Admitting: Primary Care

## 2016-01-03 VITALS — BP 118/74 | HR 73 | Temp 98.4°F | Ht 64.0 in | Wt 163.0 lb

## 2016-01-03 DIAGNOSIS — E785 Hyperlipidemia, unspecified: Secondary | ICD-10-CM

## 2016-01-03 DIAGNOSIS — E119 Type 2 diabetes mellitus without complications: Secondary | ICD-10-CM

## 2016-01-03 DIAGNOSIS — I10 Essential (primary) hypertension: Secondary | ICD-10-CM | POA: Diagnosis not present

## 2016-01-03 LAB — MICROALBUMIN / CREATININE URINE RATIO
CREATININE, U: 156.6 mg/dL
MICROALB UR: 1.2 mg/dL (ref 0.0–1.9)
MICROALB/CREAT RATIO: 0.8 mg/g (ref 0.0–30.0)

## 2016-01-03 LAB — HEMOGLOBIN A1C: HEMOGLOBIN A1C: 6.2 % (ref 4.6–6.5)

## 2016-01-03 NOTE — Progress Notes (Signed)
Pre visit review using our clinic review tool, if applicable. No additional management support is needed unless otherwise documented below in the visit note. 

## 2016-01-03 NOTE — Assessment & Plan Note (Signed)
Stable in the office today, continue Lotensin.

## 2016-01-03 NOTE — Assessment & Plan Note (Addendum)
A1C today of 6.3 which is an improvement from 6.6 six months ago. Continue Metformin. Managed on ACE and statin. Foot exam unremarkable today. Urine microalbumin negative today. Follow up in 6 months.

## 2016-01-03 NOTE — Patient Instructions (Signed)
Your symptoms are related to allergies.  Throat drainage and runny nose: Start an antihistamine such as Zyrtec, Claritin, Allegra.  Cough/Congestion: Try taking Mucinex DM. This will help loosen up the mucous in your chest. Ensure you take this medication with a full glass of water.  Complete lab work prior to leaving today. I will notify you of your results once received.   Follow up in 6 months for your annual physical or sooner if needed.  It was a pleasure to see you today!

## 2016-01-03 NOTE — Assessment & Plan Note (Signed)
Stable in May 2017, repeat in 6 months.

## 2016-01-03 NOTE — Progress Notes (Signed)
Subjective:    Patient ID: [name], female    DOB: August 20, 1929, 82 y.o.   MRN: [id number]  HPI  [name] is an 82 year old female who presents today for follow up.  1) Type 2 Diabetes: Currently managed on metformin 850 mg once every evening. Her last A1C was 6.6 in May 2017. She checks her blood sugars 1-2 times daily fasting which runs 145 on average. She denies numbers below 80 or above 200. She does experience intermittent tingling to her feet. She denies weakness, and fatigue.  2) Hyperlipidemia: Currently managed on atorvastatin 10 mg. Lipid panel in May 2017 stable.   3) Essential Hypertension: Currently managed on benazepril-HCTZ 10-12.5 mg. She denies chest pain, shortness of breath. She does experience occasional swelling to her feet.  4) Rhinorrhea: Also with cough, hoarse to voice, post nasal drip. Her symptoms have been present for the past 1 week. She's taken Alka-Selzer Cold OTC with some improvement. She denies fevers, productive cough, palpitations, chills.  Review of Systems  Constitutional: Positive for fatigue. Negative for fever.  HENT: Positive for postnasal drip and voice change. Negative for congestion.   Respiratory: Positive for cough.   Cardiovascular: Negative for chest pain.       Past Medical History:  Diagnosis Date  . Arthritis   . Breast cancer ([place]) 2001  . CAD (coronary artery disease)    Asymptomatic  . Diabetes mellitus ([place])   . Diverticulosis   . Hiatal hernia   . History of shingles    ON BACK  . Hyperlipidemia   . Hypertension   . PVC (premature ventricular contraction)   . Vertigo      Social History   Social History  . Marital status: Widowed    Spouse name: N/A  . Number of children: N/A  . Years of education: N/A   Occupational History  . Not on file.   Social History Main Topics  . Smoking status: Never Smoker  . Smokeless tobacco: Not on file  . Alcohol use No  . Drug use: No  . Sexual activity: Not on  file   Other Topics Concern  . Not on file   Social History Narrative   Widow.    Lives by herself at home.   Volunteers at [facility] [employer].   Enjoys volunteering, helping her neighbors, spending time with family, church.    Past Surgical History:  Procedure Laterality Date  . BREAST LUMPECTOMY WITH AXILLARY LYMPH NODE DISSECTION  2003   left  . CARDIAC CATHETERIZATION  07/13/2009   Recommendation - medical therapy  . CARDIOVASCULAR STRESS TEST  06/16/2009   Mild ischemia demonstrated in the basal inferior, mid inferior, and apical inferior regions.  Marland Kitchen CATARACTS REMOVED    . CHOLECYSTECTOMY    . JOINT REPLACEMENT     BILATERAL TOTAL KNEES  . KNEE ARTHROPLASTY Right 2009  . TOTAL HIP ARTHROPLASTY Left 11/03/2014   Procedure: LEFT TOTAL HIP ARTHROPLASTY ANTERIOR APPROACH;  Surgeon: Gaynelle Arabian, MD;  Location: [facility];  Service: Orthopedics;  Laterality: Left;  . TRANSTHORACIC ECHOCARDIOGRAM  06/16/2009   EF >55%, no significant valvular abnormalities.    Family History  Problem Relation Age of Onset  . Heart attack Mother   . Cancer Mother   . Stroke Father   . Stroke Brother   . Stroke Brother     No Known Allergies  Current Outpatient Prescriptions on File Prior to Visit  Medication Sig Dispense Refill  . aspirin  81 MG tablet Take 81 mg by mouth daily.    Marland Kitchen atorvastatin (LIPITOR) 10 MG tablet Take 1 tablet (10 mg total) by mouth daily. 90 tablet 2  . benazepril-hydrochlorthiazide (LOTENSIN HCT) 10-12.5 MG tablet Take 1 tablet by mouth daily. 90 tablet 2  . meclizine (ANTIVERT) 25 MG tablet Take 25 mg by mouth 3 (three) times daily as needed for dizziness.     . metFORMIN (GLUCOPHAGE) 850 MG tablet Take 1 tablet by mouth every evening with dinner. 90 tablet 2  . Zoster Vaccine Live, PF, (ZOSTAVAX) 13086 UNT/0.65ML injection Inject 19,400 Units into the skin once. (Patient not taking: Reported on 01/03/2016) 1 each 0   No current facility-administered medications on file prior to  visit.     BP 118/74   Pulse 73   Temp 98.4 F (36.9 C) (Oral)   Ht 5\' 4"  (1.626 m)   Wt 163 lb (73.9 kg)   SpO2 98%   BMI 27.98 kg/m    Objective:   Physical Exam  Constitutional: She appears well-nourished.  HENT:  Right Ear: Tympanic membrane and ear canal normal.  Left Ear: Tympanic membrane and ear canal normal.  Nose: Right sinus exhibits no maxillary sinus tenderness and no frontal sinus tenderness. Left sinus exhibits no maxillary sinus tenderness and no frontal sinus tenderness.  Mouth/Throat: Oropharynx is clear and moist.  Eyes: Conjunctivae are normal.  Neck: Neck supple.  Cardiovascular: Normal rate and regular rhythm.   Pulmonary/Chest: Effort normal and breath sounds normal. She has no wheezes. She has no rales.  Lymphadenopathy:    She has no cervical adenopathy.  Skin: Skin is warm and dry.          Assessment & Plan:  Viral URI:  Post nasal drip, fatigue, voice hoariness x 1 week. Overall feeling better. Exam today with clear lungs, does not appear acutely ill, vitals stable. Suspect viral URI and will treat with supportive measures. Discussed use of antihistamine, mucinex, delsym. Return precautions provided.  Sheral Flow, NP

## 2016-01-12 ENCOUNTER — Other Ambulatory Visit: Payer: Self-pay | Admitting: Primary Care

## 2016-01-12 DIAGNOSIS — Z1231 Encounter for screening mammogram for malignant neoplasm of breast: Secondary | ICD-10-CM

## 2016-01-18 ENCOUNTER — Other Ambulatory Visit: Payer: Self-pay | Admitting: Primary Care

## 2016-01-18 DIAGNOSIS — E785 Hyperlipidemia, unspecified: Secondary | ICD-10-CM

## 2016-01-23 ENCOUNTER — Other Ambulatory Visit: Payer: Self-pay | Admitting: *Deleted

## 2016-01-23 ENCOUNTER — Telehealth: Payer: Self-pay | Admitting: Primary Care

## 2016-01-23 DIAGNOSIS — H8110 Benign paroxysmal vertigo, unspecified ear: Secondary | ICD-10-CM

## 2016-01-23 NOTE — Telephone Encounter (Signed)
Recently received refill request for Meclizine and would like some additional information.  1. How often does she take this medication? 2. Who originally prescribed this medication?  3. What are her symptoms? How long has she had these symptoms.

## 2016-01-23 NOTE — Telephone Encounter (Signed)
Ok to refill? You have never filled this Rx.

## 2016-01-24 MED ORDER — MECLIZINE HCL 25 MG PO TABS: 25.0000 mg | ORAL_TABLET | Freq: Two times a day (BID) | ORAL | 1 refills | Status: DC | PRN

## 2016-01-24 NOTE — Telephone Encounter (Signed)
Noted.  Refill sent to pharmacy. 

## 2016-01-24 NOTE — Telephone Encounter (Signed)
Spoke with patient. She said Dr. Hardin Negus originally prescribed med for her for Vertigo/Dizziness. She has had this for "years". She likes to keep it on hand, but doesn't need it often. She will normally takes it at night and sometimes in the morning when waking.

## 2016-01-27 ENCOUNTER — Other Ambulatory Visit: Payer: Self-pay | Admitting: Primary Care

## 2016-01-27 DIAGNOSIS — I10 Essential (primary) hypertension: Secondary | ICD-10-CM

## 2016-02-16 ENCOUNTER — Ambulatory Visit
Admission: RE | Admit: 2016-02-16 | Discharge: 2016-02-16 | Disposition: A | Discharge status: Home / Self Care | Payer: Medicare Other | Source: Ambulatory Visit | Attending: Primary Care | Admitting: Primary Care

## 2016-02-16 DIAGNOSIS — Z1231 Encounter for screening mammogram for malignant neoplasm of breast: Secondary | ICD-10-CM

## 2016-04-17 DIAGNOSIS — D485 Neoplasm of uncertain behavior of skin: Secondary | ICD-10-CM | POA: Diagnosis not present

## 2016-04-17 DIAGNOSIS — X32XXXA Exposure to sunlight, initial encounter: Secondary | ICD-10-CM | POA: Diagnosis not present

## 2016-04-17 DIAGNOSIS — Z85828 Personal history of other malignant neoplasm of skin: Secondary | ICD-10-CM | POA: Diagnosis not present

## 2016-04-17 DIAGNOSIS — D0471 Carcinoma in situ of skin of right lower limb, including hip: Secondary | ICD-10-CM | POA: Diagnosis not present

## 2016-04-17 DIAGNOSIS — L570 Actinic keratosis: Secondary | ICD-10-CM | POA: Diagnosis not present

## 2016-04-17 DIAGNOSIS — L821 Other seborrheic keratosis: Secondary | ICD-10-CM | POA: Diagnosis not present

## 2016-04-17 DIAGNOSIS — Z08 Encounter for follow-up examination after completed treatment for malignant neoplasm: Secondary | ICD-10-CM | POA: Diagnosis not present

## 2016-04-21 ENCOUNTER — Other Ambulatory Visit: Payer: Self-pay | Admitting: Primary Care

## 2016-04-21 DIAGNOSIS — E119 Type 2 diabetes mellitus without complications: Secondary | ICD-10-CM

## 2016-04-25 DIAGNOSIS — D0471 Carcinoma in situ of skin of right lower limb, including hip: Secondary | ICD-10-CM | POA: Diagnosis not present

## 2016-05-08 ENCOUNTER — Telehealth: Payer: Self-pay

## 2016-05-08 MED ORDER — ONETOUCH ULTRA 2 W/DEVICE KIT: PACK | 0 refills | Status: DC

## 2016-05-08 MED ORDER — GLUCOSE BLOOD VI STRP: ORAL_STRIP | 3 refills | Status: DC

## 2016-05-08 NOTE — Telephone Encounter (Signed)
[place] left v/m; the place pt got diabetic supplies have closed. Request new onetouch ultra meter and test strips. Done as requested and by protocol.

## 2016-06-28 ENCOUNTER — Telehealth: Payer: Self-pay | Admitting: *Deleted

## 2016-06-28 NOTE — Telephone Encounter (Signed)
Patient came in  office to drop off Disability parking placard. Will be in provider box. Thank you

## 2016-07-03 NOTE — Telephone Encounter (Signed)
Left detailed message on voicemail asking for return call for response.

## 2016-07-03 NOTE — Telephone Encounter (Signed)
What is the reason for her disability placard request? I need this information in order to complete the form. Has she ever had one before?

## 2016-07-04 NOTE — Telephone Encounter (Signed)
Message left for patient to return my call.  

## 2016-07-04 NOTE — Telephone Encounter (Signed)
Spoken to patient and she stated that the reason is because she has knee pain. She also is a bit off balance when she walking at times.  She had for before and it has been 5 years.

## 2016-07-04 NOTE — Telephone Encounter (Signed)
Noted, placard ready for pick up. Placed in [place].

## 2016-07-05 NOTE — Telephone Encounter (Signed)
Spoken to patient and notified patient that the placard left in the front office for pick up.

## 2016-07-11 ENCOUNTER — Ambulatory Visit: Payer: Medicare Other | Admitting: Primary Care

## 2016-07-17 ENCOUNTER — Ambulatory Visit (INDEPENDENT_AMBULATORY_CARE_PROVIDER_SITE_OTHER): Payer: Medicare Other | Admitting: Primary Care

## 2016-07-17 ENCOUNTER — Other Ambulatory Visit: Payer: Self-pay | Admitting: Primary Care

## 2016-07-17 ENCOUNTER — Ambulatory Visit (INDEPENDENT_AMBULATORY_CARE_PROVIDER_SITE_OTHER): Payer: Medicare Other

## 2016-07-17 VITALS — BP 124/78 | HR 69 | Temp 98.0°F | Ht 64.0 in | Wt 168.8 lb

## 2016-07-17 VITALS — BP 124/78 | HR 69 | Temp 98.0°F | Ht 64.0 in | Wt 168.0 lb

## 2016-07-17 DIAGNOSIS — E785 Hyperlipidemia, unspecified: Secondary | ICD-10-CM

## 2016-07-17 DIAGNOSIS — I2510 Atherosclerotic heart disease of native coronary artery without angina pectoris: Secondary | ICD-10-CM | POA: Diagnosis not present

## 2016-07-17 DIAGNOSIS — Z0000 Encounter for general adult medical examination without abnormal findings: Secondary | ICD-10-CM

## 2016-07-17 DIAGNOSIS — Z23 Encounter for immunization: Secondary | ICD-10-CM | POA: Diagnosis not present

## 2016-07-17 DIAGNOSIS — E119 Type 2 diabetes mellitus without complications: Secondary | ICD-10-CM

## 2016-07-17 DIAGNOSIS — I10 Essential (primary) hypertension: Secondary | ICD-10-CM | POA: Diagnosis not present

## 2016-07-17 LAB — LIPID PANEL
CHOL/HDL RATIO: 3
CHOLESTEROL: 165 mg/dL (ref 0–200)
HDL: 51.4 mg/dL (ref >39.00)
LDL Cholesterol: 80 mg/dL (ref 0–99)
NonHDL: 113.52
TRIGLYCERIDES: 168 mg/dL — AB (ref 0.0–149.0)
VLDL: 33.6 mg/dL (ref 0.0–40.0)

## 2016-07-17 LAB — COMPREHENSIVE METABOLIC PANEL
ALK PHOS: 108 U/L (ref 39–117)
ALT: 17 U/L (ref 0–35)
AST: 19 U/L (ref 0–37)
Albumin: 4.2 g/dL (ref 3.5–5.2)
BILIRUBIN TOTAL: 0.9 mg/dL (ref 0.2–1.2)
BUN: 27 mg/dL — ABNORMAL HIGH (ref 6–23)
CALCIUM: 9.8 mg/dL (ref 8.4–10.5)
CO2: 28 mEq/L (ref 19–32)
Chloride: 104 mEq/L (ref 96–112)
Creatinine, Ser: 1.33 mg/dL — ABNORMAL HIGH (ref 0.40–1.20)
GFR: 40.32 mL/min — AB (ref >60.00)
GLUCOSE: 142 mg/dL — AB (ref 70–99)
Potassium: 4.2 mEq/L (ref 3.5–5.1)
Sodium: 141 mEq/L (ref 135–145)
TOTAL PROTEIN: 6.9 g/dL (ref 6.0–8.3)

## 2016-07-17 LAB — HEMOGLOBIN A1C: Hgb A1c MFr Bld: 6.9 % — ABNORMAL HIGH (ref 4.6–6.5)

## 2016-07-17 MED ORDER — GLIPIZIDE ER 2.5 MG PO TB24: 2.5000 mg | ORAL_TABLET | Freq: Every day | ORAL | 1 refills | Status: DC

## 2016-07-17 NOTE — Progress Notes (Signed)
Pre visit review using our clinic review tool, if applicable. No additional management support is needed unless otherwise documented below in the visit note. 

## 2016-07-17 NOTE — Patient Instructions (Addendum)
Complete lab work prior to leaving today. I will notify you of your results once received.   I will be in touch regarding the Metformin dose.   Schedule your Medicare Wellness Visit with our nurse Katha Cabal. Follow up in 6 months with me for re-evaluation.   It was a pleasure to see you today!  Diabetes Mellitus and Food It is important for you to manage your blood sugar (glucose) level. Your blood glucose level can be greatly affected by what you eat. Eating healthier foods in the appropriate amounts throughout the day at about the same time each day will help you control your blood glucose level. It can also help slow or prevent worsening of your diabetes mellitus. Healthy eating may even help you improve the level of your blood pressure and reach or maintain a healthy weight. General recommendations for healthful eating and cooking habits include:  Eating meals and snacks regularly. Avoid going long periods of time without eating to lose weight.  Eating a diet that consists mainly of plant-based foods, such as fruits, vegetables, nuts, legumes, and whole grains.  Using low-heat cooking methods, such as baking, instead of high-heat cooking methods, such as deep frying.  Work with your dietitian to make sure you understand how to use the Nutrition Facts information on food labels. How can food affect me? Carbohydrates Carbohydrates affect your blood glucose level more than any other type of food. Your dietitian will help you determine how many carbohydrates to eat at each meal and teach you how to count carbohydrates. Counting carbohydrates is important to keep your blood glucose at a healthy level, especially if you are using insulin or taking certain medicines for diabetes mellitus. Alcohol Alcohol can cause sudden decreases in blood glucose (hypoglycemia), especially if you use insulin or take certain medicines for diabetes mellitus. Hypoglycemia can be a life-threatening condition. Symptoms  of hypoglycemia (sleepiness, dizziness, and disorientation) are similar to symptoms of having too much alcohol. If your health care provider has given you approval to drink alcohol, do so in moderation and use the following guidelines:  Women should not have more than one drink per day, and men should not have more than two drinks per day. One drink is equal to: ? 12 oz of beer. ? 5 oz of wine. ? 1 oz of hard liquor.  Do not drink on an empty stomach.  Keep yourself hydrated. Have water, diet soda, or unsweetened iced tea.  Regular soda, juice, and other mixers might contain a lot of carbohydrates and should be counted.  What foods are not recommended? As you make food choices, it is important to remember that all foods are not the same. Some foods have fewer nutrients per serving than other foods, even though they might have the same number of calories or carbohydrates. It is difficult to get your body what it needs when you eat foods with fewer nutrients. Examples of foods that you should avoid that are high in calories and carbohydrates but low in nutrients include:  Trans fats (most processed foods list trans fats on the Nutrition Facts label).  Regular soda.  Juice.  Candy.  Sweets, such as cake, pie, doughnuts, and cookies.  Fried foods.  What foods can I eat? Eat nutrient-rich foods, which will nourish your body and keep you healthy. The food you should eat also will depend on several factors, including:  The calories you need.  The medicines you take.  Your weight.  Your blood glucose level.  Your blood pressure level.  Your cholesterol level.  You should eat a variety of foods, including:  Protein. ? Lean cuts of meat. ? Proteins low in saturated fats, such as fish, egg whites, and beans. Avoid processed meats.  Fruits and vegetables. ? Fruits and vegetables that may help control blood glucose levels, such as apples, mangoes, and yams.  Dairy  products. ? Choose fat-free or low-fat dairy products, such as milk, yogurt, and cheese.  Grains, bread, pasta, and rice. ? Choose whole grain products, such as multigrain bread, whole oats, and brown rice. These foods may help control blood pressure.  Fats. ? Foods containing healthful fats, such as nuts, avocado, olive oil, canola oil, and fish.  Does everyone with diabetes mellitus have the same meal plan? Because every person with diabetes mellitus is different, there is not one meal plan that works for everyone. It is very important that you meet with a dietitian who will help you create a meal plan that is just right for you. This information is not intended to replace advice given to you by your health care provider. Make sure you discuss any questions you have with your health care provider. Document Released: 10/26/2004 Document Revised: 07/07/2015 Document Reviewed: 12/26/2012 Elsevier Interactive Patient Education  2017 [employer].

## 2016-07-17 NOTE — Assessment & Plan Note (Signed)
Stable in the office today, continue Lotensin. BMP pending.

## 2016-07-17 NOTE — Patient Instructions (Signed)
[name] , Thank you for taking time to come for your Medicare Wellness Visit. I appreciate your ongoing commitment to your health goals. Please review the following plan we discussed and let me know if I can assist you in the future.   These are the goals we discussed: Goals    . Eat more fruits and vegetables          Starting 07/17/16, I will continue to eat at least 4-5 servings of fresh fruits and vegetables daily.        This is a list of the screening recommended for you and due dates:  Health Maintenance  Topic Date Due  . Eye exam for diabetics  07/17/2017*  . Tetanus Vaccine  07/18/2026*  . Flu Shot  09/12/2016  . Complete foot exam   01/02/2017  . Hemoglobin A1C  01/16/2017  . DEXA scan (bone density measurement)  Completed  . Pneumonia vaccines  Completed  *Topic was postponed. The date shown is not the original due date.   Preventive Care for Adults  A healthy lifestyle and preventive care can promote health and wellness. Preventive health guidelines for adults include the following key practices.  . A routine yearly physical is a good way to check with your health care provider about your health and preventive screening. It is a chance to share any concerns and updates on your health and to receive a thorough exam.  . Visit your dentist for a routine exam and preventive care every 6 months. Brush your teeth twice a day and floss once a day. Good oral hygiene prevents tooth decay and gum disease.  . The frequency of eye exams is based on your age, health, family medical history, use  of contact lenses, and other factors. Follow your health care provider's ecommendations for frequency of eye exams.  . Eat a healthy diet. Foods like vegetables, fruits, whole grains, low-fat dairy products, and lean protein foods contain the nutrients you need without too many calories. Decrease your intake of foods high in solid fats, added sugars, and salt. Eat the right amount of  calories for you. Get information about a proper diet from your health care provider, if necessary.  . Regular physical exercise is one of the most important things you can do for your health. Most adults should get at least 150 minutes of moderate-intensity exercise (any activity that increases your heart rate and causes you to sweat) each week. In addition, most adults need muscle-strengthening exercises on 2 or more days a week.  Silver Sneakers may be a benefit available to you. To determine eligibility, you may visit the website: www.silversneakers.com or contact program at 708-483-7429 Mon-Fri between 8AM-8PM.   . Maintain a healthy weight. The body mass index (BMI) is a screening tool to identify possible weight problems. It provides an estimate of body fat based on height and weight. Your health care provider can find your BMI and can help you achieve or maintain a healthy weight.   For adults 20 years and older: ? A BMI below 18.5 is considered underweight. ? A BMI of 18.5 to 24.9 is normal. ? A BMI of 25 to 29.9 is considered overweight. ? A BMI of 30 and above is considered obese.   . Maintain normal blood lipids and cholesterol levels by exercising and minimizing your intake of saturated fat. Eat a balanced diet with plenty of fruit and vegetables. Blood tests for lipids and cholesterol should begin at age 3 and  be repeated every 5 years. If your lipid or cholesterol levels are high, you are over 50, or you are at high risk for heart disease, you may need your cholesterol levels checked more frequently. Ongoing high lipid and cholesterol levels should be treated with medicines if diet and exercise are not working.  . If you smoke, find out from your health care provider how to quit. If you do not use tobacco, please do not start.  . If you choose to drink alcohol, please do not consume more than 2 drinks per day. One drink is considered to be 12 ounces (355 mL) of beer, 5 ounces  (148 mL) of wine, or 1.5 ounces (44 mL) of liquor.  . If you are 66-54 years old, ask your health care provider if you should take aspirin to prevent strokes.  . Use sunscreen. Apply sunscreen liberally and repeatedly throughout the day. You should seek shade when your shadow is shorter than you. Protect yourself by wearing long sleeves, pants, a wide-brimmed hat, and sunglasses year round, whenever you are outdoors.  . Once a month, do a whole body skin exam, using a mirror to look at the skin on your back. Tell your health care provider of new moles, moles that have irregular borders, moles that are larger than a pencil eraser, or moles that have changed in shape or color.

## 2016-07-17 NOTE — Assessment & Plan Note (Signed)
Managed on aspirin and statin. Continue BP and lipid control.

## 2016-07-17 NOTE — Progress Notes (Signed)
Subjective:    Patient ID: [name], female    DOB: 11-Feb-1930, 83 y.o.   MRN: [id number]  HPI  [name] is an 83 year old female who presents today for follow up.  1) Essential Hypertension: Currently managed on Lotensin. Her BP in the office today is 124/78. She denies chest pain, shortness of breath.  2) Hyperlipidemia: Currently managed on atorvastatin 10 mg. She is due for repeat lipid panel today. She denies myalgias.  3) Type 2 Diabetes: Currently managed on Metformin 850 mg BID. She is due for repeat A1C today. She's checking her sugars in the morning fasting which are running 150's-170's. She's been taking one tablet of her Metformin as she had a problem with diarrhea. Overall she's not noticed much of a difference in diarrhea with her reduction in Metformin dose. Her afternoon Metformin dose didn't typically bother her with diarrhea.  Review of Systems  Eyes: Negative for visual disturbance.  Respiratory: Negative for shortness of breath.   Cardiovascular: Negative for chest pain.  Musculoskeletal: Positive for arthralgias.  Neurological: Negative for dizziness, numbness and headaches.       Past Medical History:  Diagnosis Date  . Arthritis   . Breast cancer ([place]) 2001  . CAD (coronary artery disease)    Asymptomatic  . Diabetes mellitus ([place])   . Diverticulosis   . Hiatal hernia   . History of shingles    ON BACK  . Hyperlipidemia   . Hypertension   . PVC (premature ventricular contraction)   . Vertigo      Social History   Social History  . Marital status: Widowed    Spouse name: N/A  . Number of children: N/A  . Years of education: N/A   Occupational History  . Not on file.   Social History Main Topics  . Smoking status: Never Smoker  . Smokeless tobacco: Not on file  . Alcohol use No  . Drug use: No  . Sexual activity: Not on file   Other Topics Concern  . Not on file   Social History Narrative   Widow.    Lives by herself at  home.   Volunteers at [facility] [employer].   Enjoys volunteering, helping her neighbors, spending time with family, church.    Past Surgical History:  Procedure Laterality Date  . BREAST LUMPECTOMY WITH AXILLARY LYMPH NODE DISSECTION  2003   left  . CARDIAC CATHETERIZATION  07/13/2009   Recommendation - medical therapy  . CARDIOVASCULAR STRESS TEST  06/16/2009   Mild ischemia demonstrated in the basal inferior, mid inferior, and apical inferior regions.  Marland Kitchen CATARACTS REMOVED    . CHOLECYSTECTOMY    . JOINT REPLACEMENT     BILATERAL TOTAL KNEES  . KNEE ARTHROPLASTY Right 2009  . TOTAL HIP ARTHROPLASTY Left 11/03/2014   Procedure: LEFT TOTAL HIP ARTHROPLASTY ANTERIOR APPROACH;  Surgeon: Gaynelle Arabian, MD;  Location: [facility];  Service: Orthopedics;  Laterality: Left;  . TRANSTHORACIC ECHOCARDIOGRAM  06/16/2009   EF >55%, no significant valvular abnormalities.    Family History  Problem Relation Age of Onset  . Heart attack Mother   . Cancer Mother   . Stroke Father   . Stroke Brother   . Stroke Brother     No Known Allergies  Current Outpatient Prescriptions on File Prior to Visit  Medication Sig Dispense Refill  . aspirin 81 MG tablet Take 81 mg by mouth daily.    Marland Kitchen atorvastatin (LIPITOR) 10 MG tablet TAKE 1  TABLET BY MOUTH ONCE A DAY 90 tablet 1  . benazepril-hydrochlorthiazide (LOTENSIN HCT) 10-12.5 MG tablet TAKE 1 TABLET BY MOUTH ONCE A DAY 90 tablet 1  . Blood Glucose Monitoring Suppl (ONE TOUCH ULTRA 2) w/Device KIT Check blood sugar 1 - 2 times daily and as directed. Dx: E11.9 1 each 0  . glucose blood (ONE TOUCH ULTRA TEST) test strip Check blood sugar 1 - 2 times daily and as directed. Dx: E11.9 100 each 3  . meclizine (ANTIVERT) 25 MG tablet Take 1 tablet (25 mg total) by mouth 2 (two) times daily as needed for dizziness. 30 tablet 1  . metFORMIN (GLUCOPHAGE) 850 MG tablet TAKE 1 TABLET BY MOUTH TWICE A DAY WITH A MEAL 180 tablet 1   No current facility-administered medications on file  prior to visit.     BP 124/78   Pulse 69   Temp 98 F (36.7 C) (Oral)   Ht 5' 4"  (1.626 m)   Wt 168 lb 12.8 oz (76.6 kg)   SpO2 98%   BMI 28.97 kg/m    Objective:   Physical Exam  Constitutional: She appears well-nourished.  Neck: Neck supple.  Cardiovascular: Normal rate and regular rhythm.   Pulmonary/Chest: Effort normal and breath sounds normal.  Skin: Skin is warm and dry.          Assessment & Plan:

## 2016-07-17 NOTE — Progress Notes (Signed)
PCP notes:   Health maintenance:  Eye exam - pt plans to schedule future appt due to vision concerns Tetanus - postponed/insurance A1C - completed PPSV23 - administered  Abnormal screenings:   Hearing - failed  Patient concerns:   None  Nurse concerns:  None  Next PCP appt:   01/16/17 @ 0800

## 2016-07-17 NOTE — Progress Notes (Signed)
Subjective:   [name] is a 83 y.o. female who presents for Medicare Annual (Subsequent) preventive examination.  Review of Systems:  N/A Cardiac Risk Factors include: advanced age (>21mn, >>18women);diabetes mellitus;hypertension;dyslipidemia     Objective:     Vitals: BP 124/78 (BP Location: Right Arm, Patient Position: Sitting, Cuff Size: Normal)   Pulse 69   Temp 98 F (36.7 C) (Oral)   Ht '5\' 4"'$  (1.626 m) Comment: shoes  Wt 168 lb (76.2 kg)   SpO2 98%   BMI 28.84 kg/m   Body mass index is 28.84 kg/m.   Tobacco History  Smoking Status  . Never Smoker  Smokeless Tobacco  . Never Used     Counseling given: No   Past Medical History:  Diagnosis Date  . Arthritis   . [place]  . CAD (coronary artery disease)    Asymptomatic  . [place] ([place]   . Diverticulosis   . Hiatal hernia   . History of shingles    ON BACK  . Hyperlipidemia   . Hypertension   . PVC (premature ventricular contraction)   . Vertigo    Past Surgical History:  Procedure Laterality Date  . BREAST LUMPECTOMY WITH AXILLARY LYMPH NODE DISSECTION  2003   left  . CARDIAC CATHETERIZATION  07/13/2009   Recommendation - medical therapy  . CARDIOVASCULAR STRESS TEST  06/16/2009   Mild ischemia demonstrated in the basal inferior, mid inferior, and apical inferior regions.  .Marland KitchenCATARACTS REMOVED    . CHOLECYSTECTOMY    . JOINT REPLACEMENT     BILATERAL TOTAL KNEES  . KNEE ARTHROPLASTY Right 2009  . TOTAL HIP ARTHROPLASTY Left 11/03/2014   Procedure: LEFT TOTAL HIP ARTHROPLASTY ANTERIOR APPROACH;  Surgeon: FGaynelle Arabian MD;  Location: [facility];  Service: Orthopedics;  Laterality: Left;  . TRANSTHORACIC ECHOCARDIOGRAM  06/16/2009   EF >55%, no significant valvular abnormalities.   Family History  Problem Relation Age of Onset  . Heart attack Mother   . Cancer Mother   . Stroke Father   . Stroke Brother   . Stroke Brother    History  Sexual Activity  . Sexual  activity: Not on file    Outpatient Encounter Prescriptions as of 07/17/2016  Medication Sig  . aspirin 81 MG tablet Take 81 mg by mouth daily.  .Marland Kitchenatorvastatin (LIPITOR) 10 MG tablet TAKE 1 TABLET BY MOUTH ONCE A DAY  . benazepril-hydrochlorthiazide (LOTENSIN HCT) 10-12.5 MG tablet TAKE 1 TABLET BY MOUTH ONCE A DAY  . Blood Glucose Monitoring Suppl (ONE TOUCH ULTRA 2) w/Device KIT Check blood sugar 1 - 2 times daily and as directed. Dx: E11.9  . glucose blood (ONE TOUCH ULTRA TEST) test strip Check blood sugar 1 - 2 times daily and as directed. Dx: E11.9  . meclizine (ANTIVERT) 25 MG tablet Take 1 tablet (25 mg total) by mouth 2 (two) times daily as needed for dizziness.  . metFORMIN (GLUCOPHAGE) 850 MG tablet TAKE 1 TABLET BY MOUTH TWICE A DAY WITH A MEAL   No facility-administered encounter medications on file as of 07/17/2016.     Activities of Daily Living In your present state of health, do you have any difficulty performing the following activities: 07/17/2016  Hearing? N  Vision? Y  Difficulty concentrating or making decisions? Y  Walking or climbing stairs? N  Dressing or bathing? N  Doing errands, shopping? N  Preparing Food and eating ? N  Using the Toilet? N  In the past six months, have you accidently leaked urine? N  Do you have problems with loss of bowel control? N  Managing your Medications? N  Managing your Finances? N  Housekeeping or managing your Housekeeping? N  Some recent data might be hidden    Patient Care Team: Pleas Koch, NP as PCP - General (Internal Medicine)    Assessment:     Hearing Screening   '125Hz'$  '250Hz'$  '500Hz'$  '1000Hz'$  '2000Hz'$  '3000Hz'$  '4000Hz'$  '6000Hz'$  '8000Hz'$   Right ear:   0 0 40  0    Left ear:   0 0 0  0      Visual Acuity Screening   Right eye Left eye Both eyes  Without correction:     With correction: 20/50-1 20/40 20/30-1    Exercise Activities and Dietary recommendations Current Exercise Habits: The patient does not participate in  regular exercise at present, Exercise limited by: Other - see comments (pt reports concerns with balance)  Goals    . Eat more fruits and vegetables          Starting 07/17/16, I will continue to eat at least 4-5 servings of fresh fruits and vegetables daily.       Fall Risk Fall Risk  07/17/2016 06/30/2015  Falls in the past year? No No   Depression Screen PHQ 2/9 Scores 07/17/2016 06/30/2015  PHQ - 2 Score 0 0     Cognitive Function MMSE - Mini Mental State Exam 07/17/2016  Orientation to time 5  Orientation to Place 5  Registration 3  Attention/ Calculation 0  Recall 3  Language- name 2 objects 0  Language- repeat 1  Language- follow 3 step command 3  Language- read & follow direction 0  Write a sentence 0  Copy design 0  Total score 20     PLEASE NOTE: A Mini-Cog screen was completed. Maximum score is 20. A value of 0 denotes this part of Folstein MMSE was not completed or the patient failed this part of the Mini-Cog screening.   Mini-Cog Screening Orientation to Time - Max 5 pts Orientation to Place - Max 5 pts Registration - Max 3 pts Recall - Max 3 pts Language Repeat - Max 1 pts Language Follow 3 Step Command - Max 3 pts     Immunization History  Administered Date(s) Administered  . Pneumococcal Conjugate-13 06/30/2015   Screening Tests Health Maintenance  Topic Date Due  . OPHTHALMOLOGY EXAM  07/17/2017 (Originally 10/10/1939)  . TETANUS/TDAP  07/18/2026 (Originally 10/11/1948)  . INFLUENZA VACCINE  09/12/2016  . FOOT EXAM  01/02/2017  . HEMOGLOBIN A1C  01/16/2017  . DEXA SCAN  Completed  . PNA vac Low Risk Adult  Completed      Plan:     I have personally reviewed and addressed the Medicare Annual Wellness questionnaire and have noted the following in the patient's chart:  A. Medical and social history B. Use of alcohol, tobacco or illicit drugs  C. Current medications and supplements D. Functional ability and status E.  Nutritional status F.    Physical activity G. Advance directives H. List of other physicians I.  Hospitalizations, surgeries, and ER visits in previous 12 months J.  [place] to include hearing, vision, cognitive, depression L. Referrals and appointments - none  In addition, I have reviewed and discussed with patient certain preventive protocols, quality metrics, and best practice recommendations. A written personalized care plan for preventive services as well as general preventive health recommendations  were provided to patient.  See attached scanned questionnaire for additional information.   Signed,   Lindell Noe, MHA, BS, LPN Health Coach

## 2016-07-17 NOTE — Assessment & Plan Note (Signed)
Due for repeat A1C today. Only taking Metformin 850 mg once daily. Sugars seem to be higher. Check A1C, consider extended release Metformin dose. Check kidney function today.

## 2016-07-17 NOTE — Assessment & Plan Note (Signed)
Lipid panel pending today. Continue atorvastatin 10 mg.

## 2016-07-18 NOTE — Progress Notes (Signed)
I reviewed health advisor's note, was available for consultation, and agree with documentation and plan.  

## 2016-07-28 ENCOUNTER — Other Ambulatory Visit: Payer: Self-pay | Admitting: Primary Care

## 2016-07-28 DIAGNOSIS — E785 Hyperlipidemia, unspecified: Secondary | ICD-10-CM

## 2016-08-10 ENCOUNTER — Other Ambulatory Visit: Payer: Self-pay | Admitting: Primary Care

## 2016-08-10 DIAGNOSIS — I10 Essential (primary) hypertension: Secondary | ICD-10-CM

## 2016-08-16 ENCOUNTER — Telehealth: Payer: Self-pay | Admitting: Primary Care

## 2016-08-16 NOTE — Telephone Encounter (Signed)
-----   Message from Pleas Koch, NP sent at 07/17/2016  6:09 PM EDT ----- Regarding: Blood sugar readings How's her blood sugar since we switched her from Metformin to Glipizide XL 2.5 mg? When to repeat A1C?

## 2016-08-17 NOTE — Telephone Encounter (Signed)
The Glipizide doesn't typically cause constipation. Based off of our last visit she reported diarrhea with Metformin, so the constipation she's having now could be that she doesn't have the Metformin. She needs to ensure she's eating enough fiber and taking in enough water. She could try Miralax once daily x 1 week, or stool softeners such as docusate sodium. I'd like to repeat her A1C after September 5th, please schedule a lab only appointment.

## 2016-08-17 NOTE — Telephone Encounter (Signed)
Spoke to pt who states BS numbers have been fluctuating, but have not been below 100. She states she is now experiencing constipation and is questioning if it is being caused by the glipizide

## 2016-08-20 NOTE — Telephone Encounter (Signed)
Spoken and notified patient of Kate's comments. Patient verbalized understanding.  Lab appt on 10/18/16

## 2016-08-21 DIAGNOSIS — E113393 Type 2 diabetes mellitus with moderate nonproliferative diabetic retinopathy without macular edema, bilateral: Secondary | ICD-10-CM | POA: Diagnosis not present

## 2016-09-05 DIAGNOSIS — L538 Other specified erythematous conditions: Secondary | ICD-10-CM | POA: Diagnosis not present

## 2016-09-05 DIAGNOSIS — Z08 Encounter for follow-up examination after completed treatment for malignant neoplasm: Secondary | ICD-10-CM | POA: Diagnosis not present

## 2016-09-05 DIAGNOSIS — X32XXXA Exposure to sunlight, initial encounter: Secondary | ICD-10-CM | POA: Diagnosis not present

## 2016-09-05 DIAGNOSIS — R208 Other disturbances of skin sensation: Secondary | ICD-10-CM | POA: Diagnosis not present

## 2016-09-05 DIAGNOSIS — L570 Actinic keratosis: Secondary | ICD-10-CM | POA: Diagnosis not present

## 2016-09-05 DIAGNOSIS — B079 Viral wart, unspecified: Secondary | ICD-10-CM | POA: Diagnosis not present

## 2016-09-05 DIAGNOSIS — D485 Neoplasm of uncertain behavior of skin: Secondary | ICD-10-CM | POA: Diagnosis not present

## 2016-09-05 DIAGNOSIS — Z85828 Personal history of other malignant neoplasm of skin: Secondary | ICD-10-CM | POA: Diagnosis not present

## 2016-10-18 ENCOUNTER — Other Ambulatory Visit (INDEPENDENT_AMBULATORY_CARE_PROVIDER_SITE_OTHER): Payer: Medicare Other

## 2016-10-18 DIAGNOSIS — E119 Type 2 diabetes mellitus without complications: Secondary | ICD-10-CM

## 2016-10-18 LAB — HEMOGLOBIN A1C: HEMOGLOBIN A1C: 6.4 % (ref 4.6–6.5)

## 2016-10-25 IMAGING — DX DG PORTABLE PELVIS
1 series · 1 of 1 positions shown · non-contrast
Comparison: None.

CLINICAL DATA: Postop left total hip replacement.

EXAM:
PORTABLE PELVIS 1-2 VIEWS

[pelvis ap]
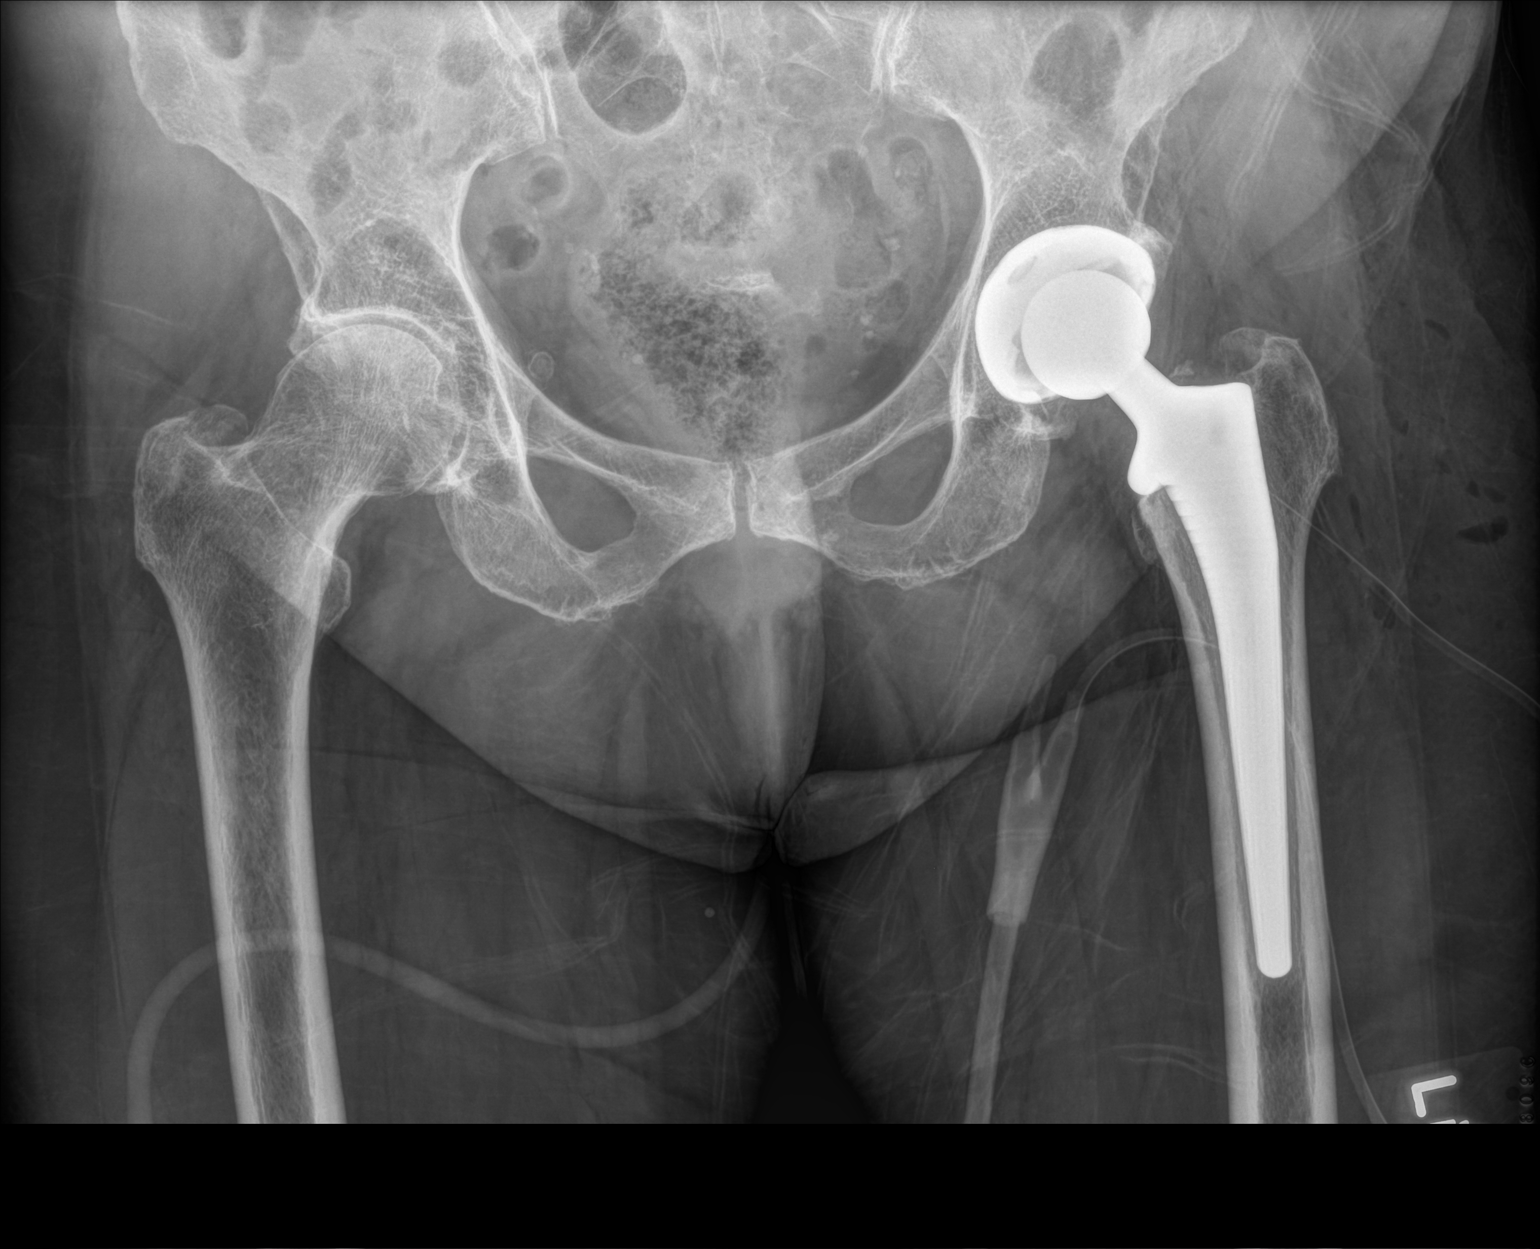

[1 of 1 positions shown; findings below may reference images not displayed]

FINDINGS: Changes of left hip replacement. Normal AP alignment. Moderate
degenerative changes in the right hip. No hardware or bony
complicating feature.
IMPRESSION: Left hip replacement without visible complicating feature.

## 2016-12-25 DIAGNOSIS — E113393 Type 2 diabetes mellitus with moderate nonproliferative diabetic retinopathy without macular edema, bilateral: Secondary | ICD-10-CM | POA: Diagnosis not present

## 2017-01-10 ENCOUNTER — Other Ambulatory Visit: Payer: Self-pay | Admitting: Primary Care

## 2017-01-10 DIAGNOSIS — E119 Type 2 diabetes mellitus without complications: Secondary | ICD-10-CM

## 2017-01-10 DIAGNOSIS — Z1231 Encounter for screening mammogram for malignant neoplasm of breast: Secondary | ICD-10-CM

## 2017-01-16 ENCOUNTER — Ambulatory Visit (INDEPENDENT_AMBULATORY_CARE_PROVIDER_SITE_OTHER): Payer: Medicare Other | Admitting: Primary Care

## 2017-01-16 ENCOUNTER — Encounter: Payer: Self-pay | Admitting: Primary Care

## 2017-01-16 VITALS — BP 136/76 | HR 61 | Temp 98.1°F | Ht 64.0 in | Wt 166.5 lb

## 2017-01-16 DIAGNOSIS — I10 Essential (primary) hypertension: Secondary | ICD-10-CM

## 2017-01-16 DIAGNOSIS — E119 Type 2 diabetes mellitus without complications: Secondary | ICD-10-CM | POA: Diagnosis not present

## 2017-01-16 DIAGNOSIS — I2510 Atherosclerotic heart disease of native coronary artery without angina pectoris: Secondary | ICD-10-CM | POA: Diagnosis not present

## 2017-01-16 LAB — BASIC METABOLIC PANEL
BUN: 28 mg/dL — ABNORMAL HIGH (ref 6–23)
CO2: 28 meq/L (ref 19–32)
Calcium: 9.2 mg/dL (ref 8.4–10.5)
Chloride: 105 mEq/L (ref 96–112)
Creatinine, Ser: 1.33 mg/dL — ABNORMAL HIGH (ref 0.40–1.20)
GFR: 40.28 mL/min — ABNORMAL LOW (ref >60.00)
GLUCOSE: 128 mg/dL — AB (ref 70–99)
POTASSIUM: 4.2 meq/L (ref 3.5–5.1)
SODIUM: 142 meq/L (ref 135–145)

## 2017-01-16 LAB — HEMOGLOBIN A1C: Hgb A1c MFr Bld: 6.2 % (ref 4.6–6.5)

## 2017-01-16 NOTE — Assessment & Plan Note (Signed)
Repeat A1C and BMP pending. Compliant to glipizide, no complications. Pneumonia vaccinations, eye exam UTD,  Foot exam completed today. Managed on ACE and statin. Follow up in 6 months.

## 2017-01-16 NOTE — Assessment & Plan Note (Signed)
Stable on Lotensin, BMP pending.

## 2017-01-16 NOTE — Patient Instructions (Addendum)
Complete lab work prior to leaving today. I will notify you of your results once received.   Start exercising. You should be getting 150 minutes of moderate intensity exercise weekly.  Increase consumption of vegetables, fruit, whole grains, water.  Please schedule a follow up appointment in 6 months right after your Medicare Visit with Katha Cabal.   Diabetes Mellitus and Food It is important for you to manage your blood sugar (glucose) level. Your blood glucose level can be greatly affected by what you eat. Eating healthier foods in the appropriate amounts throughout the day at about the same time each day will help you control your blood glucose level. It can also help slow or prevent worsening of your diabetes mellitus. Healthy eating may even help you improve the level of your blood pressure and reach or maintain a healthy weight. General recommendations for healthful eating and cooking habits include:  Eating meals and snacks regularly. Avoid going long periods of time without eating to lose weight.  Eating a diet that consists mainly of plant-based foods, such as fruits, vegetables, nuts, legumes, and whole grains.  Using low-heat cooking methods, such as baking, instead of high-heat cooking methods, such as deep frying.  Work with your dietitian to make sure you understand how to use the Nutrition Facts information on food labels. How can food affect me? Carbohydrates Carbohydrates affect your blood glucose level more than any other type of food. Your dietitian will help you determine how many carbohydrates to eat at each meal and teach you how to count carbohydrates. Counting carbohydrates is important to keep your blood glucose at a healthy level, especially if you are using insulin or taking certain medicines for diabetes mellitus. Alcohol Alcohol can cause sudden decreases in blood glucose (hypoglycemia), especially if you use insulin or take certain medicines for diabetes mellitus.  Hypoglycemia can be a life-threatening condition. Symptoms of hypoglycemia (sleepiness, dizziness, and disorientation) are similar to symptoms of having too much alcohol. If your health care provider has given you approval to drink alcohol, do so in moderation and use the following guidelines:  Women should not have more than one drink per day, and men should not have more than two drinks per day. One drink is equal to: ? 12 oz of beer. ? 5 oz of wine. ? 1 oz of hard liquor.  Do not drink on an empty stomach.  Keep yourself hydrated. Have water, diet soda, or unsweetened iced tea.  Regular soda, juice, and other mixers might contain a lot of carbohydrates and should be counted.  What foods are not recommended? As you make food choices, it is important to remember that all foods are not the same. Some foods have fewer nutrients per serving than other foods, even though they might have the same number of calories or carbohydrates. It is difficult to get your body what it needs when you eat foods with fewer nutrients. Examples of foods that you should avoid that are high in calories and carbohydrates but low in nutrients include:  Trans fats (most processed foods list trans fats on the Nutrition Facts label).  Regular soda.  Juice.  Candy.  Sweets, such as cake, pie, doughnuts, and cookies.  Fried foods.  What foods can I eat? Eat nutrient-rich foods, which will nourish your body and keep you healthy. The food you should eat also will depend on several factors, including:  The calories you need.  The medicines you take.  Your weight.  Your blood glucose level.  Your blood pressure level.  Your cholesterol level.  You should eat a variety of foods, including:  Protein. ? Lean cuts of meat. ? Proteins low in saturated fats, such as fish, egg whites, and beans. Avoid processed meats.  Fruits and vegetables. ? Fruits and vegetables that may help control blood glucose  levels, such as apples, mangoes, and yams.  Dairy products. ? Choose fat-free or low-fat dairy products, such as milk, yogurt, and cheese.  Grains, bread, pasta, and rice. ? Choose whole grain products, such as multigrain bread, whole oats, and brown rice. These foods may help control blood pressure.  Fats. ? Foods containing healthful fats, such as nuts, avocado, olive oil, canola oil, and fish.  Does everyone with diabetes mellitus have the same meal plan? Because every person with diabetes mellitus is different, there is not one meal plan that works for everyone. It is very important that you meet with a dietitian who will help you create a meal plan that is just right for you. This information is not intended to replace advice given to you by your health care provider. Make sure you discuss any questions you have with your health care provider. Document Released: 10/26/2004 Document Revised: 07/07/2015 Document Reviewed: 12/26/2012 Elsevier Interactive Patient Education  2017 [employer].

## 2017-01-16 NOTE — Progress Notes (Signed)
Subjective:    Patient ID: [name], female    DOB: 1930-01-19, 83 y.o.   MRN: [id number]  HPI  [name] is an 83 year old female who presents today for follow up.  1) Type 2 Diabetes:  Current medications include: Gli[izide XL 2.5 mg  She is checking her blood glucose once daily in the morning and is getting readings of 120-130's.   Last A1C: 6.4 Last Eye Exam: Completed in June 2018 Last Foot Exam: Due Pneumonia Vaccination: UTD ACE/ARB: ACE Statin: atorvastatin  Diet currently consists of:  Breakfast: Cereal, fruit, peanut butter toast Lunch: Some vegetables, meat Dinner: Soup, sandwich Snacks: Crackers Desserts: 1-2 times weekly Beverages: Water, diet soda  Exercise: She is active but is not regularly exercising.   2) Essential Hypertension: Currently managed on benazepril-HCTZ 10/12.5 mg.   BP Readings from Last 3 Encounters:  01/16/17 136/76  07/17/16 124/78  07/17/16 124/78       Review of Systems  Respiratory: Negative for shortness of breath.   Cardiovascular: Negative for chest pain.  Neurological: Negative for dizziness and headaches.       Occasional numbness to plantar feet       Past Medical History:  Diagnosis Date  . Arthritis   . Breast cancer ([place]) 2001  . CAD (coronary artery disease)    Asymptomatic  . Diabetes mellitus ([place])   . Diverticulosis   . Hiatal hernia   . History of shingles    ON BACK  . Hyperlipidemia   . Hypertension   . PVC (premature ventricular contraction)   . Vertigo      Social History   Socioeconomic History  . Marital status: Widowed    Spouse name: Not on file  . Number of children: Not on file  . Years of education: Not on file  . Highest education level: Not on file  Social Needs  . Financial resource strain: Not on file  . Food insecurity - worry: Not on file  . Food insecurity - inability: Not on file  . Transportation needs - medical: Not on file  . Transportation needs -  non-medical: Not on file  Occupational History  . Not on file  Tobacco Use  . Smoking status: Never Smoker  . Smokeless tobacco: Never Used  Substance and Sexual Activity  . Alcohol use: No  . Drug use: No  . Sexual activity: Not on file  Other Topics Concern  . Not on file  Social History Narrative   Widow.    Lives by herself at home.   Volunteers at [employer].   Enjoys volunteering, helping her neighbors, spending time with family, church.    Past Surgical History:  Procedure Laterality Date  . BREAST LUMPECTOMY WITH AXILLARY LYMPH NODE DISSECTION  2003   left  . CARDIAC CATHETERIZATION  07/13/2009   Recommendation - medical therapy  . CARDIOVASCULAR STRESS TEST  06/16/2009   Mild ischemia demonstrated in the basal inferior, mid inferior, and apical inferior regions.  Marland Kitchen CATARACTS REMOVED    . CHOLECYSTECTOMY    . JOINT REPLACEMENT     BILATERAL TOTAL KNEES  . KNEE ARTHROPLASTY Right 2009  . TOTAL HIP ARTHROPLASTY Left 11/03/2014   Procedure: LEFT TOTAL HIP ARTHROPLASTY ANTERIOR APPROACH;  Surgeon: Gaynelle Arabian, MD;  Location: [facility];  Service: Orthopedics;  Laterality: Left;  . TRANSTHORACIC ECHOCARDIOGRAM  06/16/2009   EF >55%, no significant valvular abnormalities.    Family History  Problem Relation Age of Onset  .  Heart attack Mother   . Cancer Mother   . Stroke Father   . Stroke Brother   . Stroke Brother     No Known Allergies  Current Outpatient Medications on File Prior to Visit  Medication Sig Dispense Refill  . aspirin 81 MG tablet Take 81 mg by mouth daily.    Marland Kitchen atorvastatin (LIPITOR) 10 MG tablet TAKE 1 TABLET BY MOUTH ONCE A DAY 90 tablet 1  . benazepril-hydrochlorthiazide (LOTENSIN HCT) 10-12.5 MG tablet TAKE 1 TABLET BY MOUTH ONCE DAILY 90 tablet 2  . Blood Glucose Monitoring Suppl (ONE TOUCH ULTRA 2) w/Device KIT Check blood sugar 1 - 2 times daily and as directed. Dx: E11.9 1 each 0  . glipiZIDE (GLUCOTROL XL) 2.5 MG 24 hr tablet TAKE 1 TABLET BY MOUTH  ONCE A DAY WITH BREAKFAST 90 tablet 1  . glucose blood (ONE TOUCH ULTRA TEST) test strip Check blood sugar 1 - 2 times daily and as directed. Dx: E11.9 100 each 3  . meclizine (ANTIVERT) 25 MG tablet Take 1 tablet (25 mg total) by mouth 2 (two) times daily as needed for dizziness. 30 tablet 1   No current facility-administered medications on file prior to visit.     BP 136/76 (BP Location: Left Arm, Patient Position: Sitting, Cuff Size: Normal)   Pulse 61   Temp 98.1 F (36.7 C) (Oral)   Ht 5' 4"  (1.626 m)   Wt 166 lb 8 oz (75.5 kg)   SpO2 98%   BMI 28.58 kg/m    Objective:   Physical Exam  Constitutional: She appears well-nourished.  Neck: Neck supple.  Cardiovascular: Normal rate and regular rhythm.  Pulmonary/Chest: Effort normal and breath sounds normal.  Skin: Skin is warm and dry.          Assessment & Plan:

## 2017-01-24 ENCOUNTER — Encounter: Payer: Self-pay | Admitting: *Deleted

## 2017-02-19 ENCOUNTER — Ambulatory Visit
Admission: RE | Admit: 2017-02-19 | Discharge: 2017-02-19 | Disposition: A | Discharge status: Home / Self Care | Payer: Medicare Other | Source: Ambulatory Visit | Attending: Primary Care | Admitting: Primary Care

## 2017-02-19 DIAGNOSIS — Z1231 Encounter for screening mammogram for malignant neoplasm of breast: Secondary | ICD-10-CM | POA: Diagnosis not present

## 2017-02-21 ENCOUNTER — Other Ambulatory Visit: Payer: Self-pay | Admitting: Primary Care

## 2017-02-21 DIAGNOSIS — E785 Hyperlipidemia, unspecified: Secondary | ICD-10-CM

## 2017-04-25 ENCOUNTER — Encounter: Payer: Self-pay | Admitting: Internal Medicine

## 2017-04-25 ENCOUNTER — Ambulatory Visit (INDEPENDENT_AMBULATORY_CARE_PROVIDER_SITE_OTHER): Payer: Medicare Other | Admitting: Internal Medicine

## 2017-04-25 VITALS — BP 128/84 | HR 74 | Temp 98.1°F | Wt 167.0 lb

## 2017-04-25 DIAGNOSIS — J069 Acute upper respiratory infection, unspecified: Secondary | ICD-10-CM | POA: Diagnosis not present

## 2017-04-25 DIAGNOSIS — B9789 Other viral agents as the cause of diseases classified elsewhere: Secondary | ICD-10-CM

## 2017-04-25 NOTE — Patient Instructions (Signed)
Upper Respiratory Infection, Adult Most upper respiratory infections (URIs) are caused by a virus. A URI affects the nose, throat, and upper air passages. The most common type of URI is often called "the common cold." Follow these instructions at home:  Take medicines only as told by your doctor.  Gargle warm saltwater or take cough drops to comfort your throat as told by your doctor.  Use a warm mist humidifier or inhale steam from a shower to increase air moisture. This may make it easier to breathe.  Drink enough fluid to keep your pee (urine) clear or pale yellow.  Eat soups and other clear broths.  Have a healthy diet.  Rest as needed.  Go back to work when your fever is gone or your doctor says it is okay. ? You may need to stay home longer to avoid giving your URI to others. ? You can also wear a face mask and wash your hands often to prevent spread of the virus.  Use your inhaler more if you have asthma.  Do not use any tobacco products, including cigarettes, chewing tobacco, or electronic cigarettes. If you need help quitting, ask your doctor. Contact a doctor if:  You are getting worse, not better.  Your symptoms are not helped by medicine.  You have chills.  You are getting more short of breath.  You have brown or red mucus.  You have yellow or brown discharge from your nose.  You have pain in your face, especially when you bend forward.  You have a fever.  You have puffy (swollen) neck glands.  You have pain while swallowing.  You have white areas in the back of your throat. Get help right away if:  You have very bad or constant: ? Headache. ? Ear pain. ? Pain in your forehead, behind your eyes, and over your cheekbones (sinus pain). ? Chest pain.  You have long-lasting (chronic) lung disease and any of the following: ? Wheezing. ? Long-lasting cough. ? Coughing up blood. ? A change in your usual mucus.  You have a stiff neck.  You have  changes in your: ? Vision. ? Hearing. ? Thinking. ? Mood. This information is not intended to replace advice given to you by your health care provider. Make sure you discuss any questions you have with your health care provider. Document Released: 07/18/2007 Document Revised: 10/02/2015 Document Reviewed: 05/06/2013 Elsevier Interactive Patient Education  2018 [employer]  

## 2017-04-25 NOTE — Progress Notes (Signed)
HPI  Pt presents to the clinic today with c/o ear fullness, runny nose, nasal congestion and cough. This started 2-3 days ago. She denies ear pain or decreased hearing. She is blowing clear mucous out of her nose. The cough is nonproductive. She denies fever, chills or body aches. She has not taken anything OTC for her symptoms. She has not had sick contacts.  Review of Systems      Past Medical History:  Diagnosis Date  . Arthritis   . Breast cancer ([place]) 2001   left   . CAD (coronary artery disease)    Asymptomatic  . Diabetes mellitus ([place])   . Diverticulosis   . Hiatal hernia   . History of shingles    ON BACK  . Hyperlipidemia   . Hypertension   . PVC (premature ventricular contraction)   . Vertigo     Family History  Problem Relation Age of Onset  . Heart attack Mother   . Cancer Mother   . Stroke Father   . Stroke Brother   . Stroke Brother     Social History   Socioeconomic History  . Marital status: Widowed    Spouse name: Not on file  . Number of children: Not on file  . Years of education: Not on file  . Highest education level: Not on file  Social Needs  . Financial resource strain: Not on file  . Food insecurity - worry: Not on file  . Food insecurity - inability: Not on file  . Transportation needs - medical: Not on file  . Transportation needs - non-medical: Not on file  Occupational History  . Not on file  Tobacco Use  . Smoking status: Never Smoker  . Smokeless tobacco: Never Used  Substance and Sexual Activity  . Alcohol use: No  . Drug use: No  . Sexual activity: Not on file  Other Topics Concern  . Not on file  Social History Narrative   Widow.    Lives by herself at home.   Volunteers at [facility].   Enjoys volunteering, helping her neighbors, spending time with family, church.    No Known Allergies   Constitutional: Denies headache, fatigue, fever or  abrupt weight changes.  HEENT:  Positive ear fullness, nasal congestion. Denies  eye redness, eye pain, pressure behind the eyes, facial pain, ear pain, ringing in the ears, wax buildup, runny nose or sore throat. Respiratory: Positive cough. Denies difficulty breathing or shortness of breath.  Cardiovascular: Denies chest pain, chest tightness, palpitations or swelling in the hands or feet.   No other specific complaints in a complete review of systems (except as listed in HPI above).  Objective:   BP 128/84   Pulse 74   Temp 98.1 F (36.7 C) (Oral)   Wt 167 lb (75.8 kg)   SpO2 97%   BMI 28.67 kg/m  Wt Readings from Last 3 Encounters:  04/25/17 167 lb (75.8 kg)  01/16/17 166 lb 8 oz (75.5 kg)  07/17/16 168 lb (76.2 kg)     General: Appears her stated age, in NAD. HEENT: Head: normal shape and size, no sinus tenderness noted; Ears: Tm's gray and intact, normal light reflex; Nose: mucosa pink and moist, septum midline; Throat/Mouth: + PND. Teeth present, mucosa pink and moist, no exudate noted, no lesions or ulcerations noted.  Neck: No cervical lymphadenopathy.  Pulmonary/Chest: Normal effort and positive vesicular breath sounds. No respiratory distress. No wheezes, rales or ronchi noted.  Assessment & Plan:   Viral Upper Respiratory Infection with Cough:  Get some rest and drink plenty of water Start Claritin and Flonase OTC  RTC as needed or if symptoms persist.   Webb Silversmith, NP

## 2017-05-06 ENCOUNTER — Ambulatory Visit (INDEPENDENT_AMBULATORY_CARE_PROVIDER_SITE_OTHER): Payer: Medicare Other | Admitting: Primary Care

## 2017-05-06 ENCOUNTER — Encounter: Payer: Self-pay | Admitting: Primary Care

## 2017-05-06 VITALS — BP 130/84 | HR 78 | Temp 98.1°F | Ht 64.0 in | Wt 167.5 lb

## 2017-05-06 DIAGNOSIS — J069 Acute upper respiratory infection, unspecified: Secondary | ICD-10-CM

## 2017-05-06 MED ORDER — BENZONATATE 200 MG PO CAPS: 200.0000 mg | ORAL_CAPSULE | Freq: Three times a day (TID) | ORAL | 0 refills | Status: DC | PRN

## 2017-05-06 MED ORDER — AZITHROMYCIN 250 MG PO TABS: ORAL_TABLET | ORAL | 0 refills | Status: DC

## 2017-05-06 NOTE — Progress Notes (Signed)
Subjective:    Patient ID: [name], female    DOB: 1930-01-04, 84 y.o.   MRN: [id number]  HPI  [name] is an 84 year old female who presents today with a chief complaint of cough. [name] also repots chest congestion, fatigue.   [name] was last evaluated on 04/25/17 with a 2-3 day history of ear fullness, rhinorrhea, nasal congestion, cough. [name] was diagnosed with a viral URI with cough and was advised to try Flonase and Claritin.   Since her last visit [name]'s continued to experience fatigue, cough, congestion. Her cough is productive with clear sputum. [name] denies fevers. [name]'s been taking Claritin and Flonase without much improvement. Overall [name] feels about the same.   Review of Systems  Constitutional: Positive for fatigue. Negative for chills and fever.  HENT: Positive for congestion. Negative for sinus pressure and sore throat.   Respiratory: Positive for cough. Negative for shortness of breath.   Cardiovascular: Negative for chest pain.       Past Medical History:  Diagnosis Date  . Arthritis   . Breast cancer ([place]) 2001   left   . CAD (coronary artery disease)    Asymptomatic  . Diabetes mellitus ([place])   . Diverticulosis   . Hiatal hernia   . History of shingles    ON BACK  . Hyperlipidemia   . Hypertension   . PVC (premature ventricular contraction)   . Vertigo      Social History   Socioeconomic History  . Marital status: Widowed    Spouse name: Not on file  . Number of children: Not on file  . Years of education: Not on file  . Highest education level: Not on file  Occupational History  . Not on file  Social Needs  . Financial resource strain: Not on file  . Food insecurity:    Worry: Not on file    Inability: Not on file  . Transportation needs:    Medical: Not on file    Non-medical: Not on file  Tobacco Use  . Smoking status: Never Smoker  . Smokeless tobacco: Never Used  Substance and Sexual Activity  . Alcohol use: No  . Drug use: No  .  Sexual activity: Not on file  Lifestyle  . Physical activity:    Days per week: Not on file    Minutes per session: Not on file  . Stress: Not on file  Relationships  . Social connections:    Talks on phone: Not on file    Gets together: Not on file    Attends religious service: Not on file    Active member of club or organization: Not on file    Attends meetings of clubs or organizations: Not on file    Relationship status: Not on file  . Intimate partner violence:    Fear of current or ex partner: Not on file    Emotionally abused: Not on file    Physically abused: Not on file    Forced sexual activity: Not on file  Other Topics Concern  . Not on file  Social History Narrative   Widow.    Lives by herself at home.   Volunteers at [facility] [employer].   Enjoys volunteering, helping her neighbors, spending time with family, church.    Past Surgical History:  Procedure Laterality Date  . BREAST LUMPECTOMY Left 2001  . BREAST LUMPECTOMY WITH AXILLARY LYMPH NODE DISSECTION  2003   left  . CARDIAC CATHETERIZATION  07/13/2009   Recommendation - medical therapy  . CARDIOVASCULAR STRESS TEST  06/16/2009   Mild ischemia demonstrated in the basal inferior, mid inferior, and apical inferior regions.  Marland Kitchen CATARACTS REMOVED    . CHOLECYSTECTOMY    . JOINT REPLACEMENT     BILATERAL TOTAL KNEES  . KNEE ARTHROPLASTY Right 2009  . TOTAL HIP ARTHROPLASTY Left 11/03/2014   Procedure: LEFT TOTAL HIP ARTHROPLASTY ANTERIOR APPROACH;  Surgeon: Gaynelle Arabian, MD;  Location: [facility];  Service: Orthopedics;  Laterality: Left;  . TRANSTHORACIC ECHOCARDIOGRAM  06/16/2009   EF >55%, no significant valvular abnormalities.    Family History  Problem Relation Age of Onset  . Heart attack Mother   . Cancer Mother   . Stroke Father   . Stroke Brother   . Stroke Brother     No Known Allergies  Current Outpatient Medications on File Prior to Visit  Medication Sig Dispense Refill  . aspirin 81 MG tablet Take 81 mg  by mouth daily.    Marland Kitchen atorvastatin (LIPITOR) 10 MG tablet TAKE 1 TABLET BY MOUTH ONCE DAILY 90 tablet 1  . benazepril-hydrochlorthiazide (LOTENSIN HCT) 10-12.5 MG tablet TAKE 1 TABLET BY MOUTH ONCE DAILY 90 tablet 2  . Blood Glucose Monitoring Suppl (ONE TOUCH ULTRA 2) w/Device KIT Check blood sugar 1 - 2 times daily and as directed. Dx: E11.9 1 each 0  . glipiZIDE (GLUCOTROL XL) 2.5 MG 24 hr tablet TAKE 1 TABLET BY MOUTH ONCE A DAY WITH BREAKFAST 90 tablet 1  . glucose blood (ONE TOUCH ULTRA TEST) test strip Check blood sugar 1 - 2 times daily and as directed. Dx: E11.9 100 each 3  . meclizine (ANTIVERT) 25 MG tablet Take 1 tablet (25 mg total) by mouth 2 (two) times daily as needed for dizziness. 30 tablet 1   No current facility-administered medications on file prior to visit.     BP 130/84   Pulse 78   Temp 98.1 F (36.7 C) (Oral)   Ht 5' 4"  (1.626 m)   Wt 167 lb 8 oz (76 kg)   SpO2 98%   BMI 28.75 kg/m    Objective:   Physical Exam  Constitutional: [name] appears well-nourished. [name] appears ill.  HENT:  Right Ear: Tympanic membrane and ear canal normal.  Left Ear: Tympanic membrane and ear canal normal.  Nose: Right sinus exhibits no maxillary sinus tenderness and no frontal sinus tenderness. Left sinus exhibits no maxillary sinus tenderness and no frontal sinus tenderness.  Mouth/Throat: Oropharynx is clear and moist.  Eyes: Conjunctivae are normal.  Neck: Neck supple.  Cardiovascular: Normal rate and regular rhythm.  Pulmonary/Chest: Effort normal and breath sounds normal. [name] has no rales.  Congested cough during exam  Lymphadenopathy:    [name] has no cervical adenopathy.  Skin: Skin is warm and dry.          Assessment & Plan:  Acute Bronchitis:  Cough, congestion x 2 weeks, no improvement with OTC treatment. Exam today overall stable, did have congested cough during most of visit.  Given duration of symptoms coupled with presentation, will treat.  Rx for  Azithromycin course and Tessalon Perles sent to pharmacy.  Fluids, rest, follow up PRN.  Pleas Koch, NP

## 2017-05-06 NOTE — Patient Instructions (Signed)
Start Azithromycin antibiotics for infection. Take 2 tablets by mouth today, then 1 tablet daily for 4 additional days.  You may take Benzonatate capsules for cough. Take 1 capsule by mouth three times daily as needed for cough.  Ensure you are staying hydrated with water and rest.  It was a pleasure to see you today!

## 2017-05-10 ENCOUNTER — Other Ambulatory Visit: Payer: Self-pay | Admitting: Primary Care

## 2017-05-10 DIAGNOSIS — I10 Essential (primary) hypertension: Secondary | ICD-10-CM

## 2017-05-15 DIAGNOSIS — Z08 Encounter for follow-up examination after completed treatment for malignant neoplasm: Secondary | ICD-10-CM | POA: Diagnosis not present

## 2017-05-15 DIAGNOSIS — L570 Actinic keratosis: Secondary | ICD-10-CM | POA: Diagnosis not present

## 2017-05-15 DIAGNOSIS — D225 Melanocytic nevi of trunk: Secondary | ICD-10-CM | POA: Diagnosis not present

## 2017-05-15 DIAGNOSIS — Z85828 Personal history of other malignant neoplasm of skin: Secondary | ICD-10-CM | POA: Diagnosis not present

## 2017-05-15 DIAGNOSIS — D2272 Melanocytic nevi of left lower limb, including hip: Secondary | ICD-10-CM | POA: Diagnosis not present

## 2017-05-15 DIAGNOSIS — D2271 Melanocytic nevi of right lower limb, including hip: Secondary | ICD-10-CM | POA: Diagnosis not present

## 2017-05-15 DIAGNOSIS — L821 Other seborrheic keratosis: Secondary | ICD-10-CM | POA: Diagnosis not present

## 2017-05-15 DIAGNOSIS — X32XXXA Exposure to sunlight, initial encounter: Secondary | ICD-10-CM | POA: Diagnosis not present

## 2017-05-15 DIAGNOSIS — D2262 Melanocytic nevi of left upper limb, including shoulder: Secondary | ICD-10-CM | POA: Diagnosis not present

## 2017-05-15 DIAGNOSIS — D2261 Melanocytic nevi of right upper limb, including shoulder: Secondary | ICD-10-CM | POA: Diagnosis not present

## 2017-06-08 ENCOUNTER — Other Ambulatory Visit: Payer: Self-pay | Admitting: Primary Care

## 2017-07-05 ENCOUNTER — Other Ambulatory Visit: Payer: Self-pay | Admitting: Primary Care

## 2017-07-05 DIAGNOSIS — E119 Type 2 diabetes mellitus without complications: Secondary | ICD-10-CM

## 2017-07-16 ENCOUNTER — Ambulatory Visit: Payer: Medicare Other | Admitting: Primary Care

## 2017-07-19 ENCOUNTER — Ambulatory Visit (INDEPENDENT_AMBULATORY_CARE_PROVIDER_SITE_OTHER): Payer: Medicare Other | Admitting: Primary Care

## 2017-07-19 ENCOUNTER — Ambulatory Visit: Payer: Medicare Other

## 2017-07-19 ENCOUNTER — Ambulatory Visit (INDEPENDENT_AMBULATORY_CARE_PROVIDER_SITE_OTHER): Payer: Medicare Other

## 2017-07-19 VITALS — BP 130/60 | HR 62 | Temp 98.6°F | Ht 62.75 in | Wt 170.5 lb

## 2017-07-19 DIAGNOSIS — I2510 Atherosclerotic heart disease of native coronary artery without angina pectoris: Secondary | ICD-10-CM | POA: Diagnosis not present

## 2017-07-19 DIAGNOSIS — I10 Essential (primary) hypertension: Secondary | ICD-10-CM

## 2017-07-19 DIAGNOSIS — E7849 Other hyperlipidemia: Secondary | ICD-10-CM | POA: Diagnosis not present

## 2017-07-19 DIAGNOSIS — R42 Dizziness and giddiness: Secondary | ICD-10-CM

## 2017-07-19 DIAGNOSIS — M169 Osteoarthritis of hip, unspecified: Secondary | ICD-10-CM | POA: Diagnosis not present

## 2017-07-19 DIAGNOSIS — E785 Hyperlipidemia, unspecified: Secondary | ICD-10-CM

## 2017-07-19 DIAGNOSIS — Z0000 Encounter for general adult medical examination without abnormal findings: Secondary | ICD-10-CM | POA: Diagnosis not present

## 2017-07-19 DIAGNOSIS — E119 Type 2 diabetes mellitus without complications: Secondary | ICD-10-CM

## 2017-07-19 LAB — LIPID PANEL
CHOL/HDL RATIO: 3
Cholesterol: 157 mg/dL (ref 0–200)
HDL: 46.4 mg/dL (ref >39.00)
LDL CALC: 84 mg/dL (ref 0–99)
NonHDL: 110.91
TRIGLYCERIDES: 133 mg/dL (ref 0.0–149.0)
VLDL: 26.6 mg/dL (ref 0.0–40.0)

## 2017-07-19 LAB — COMPREHENSIVE METABOLIC PANEL
ALT: 25 U/L (ref 0–35)
AST: 22 U/L (ref 0–37)
Albumin: 4.1 g/dL (ref 3.5–5.2)
Alkaline Phosphatase: 97 U/L (ref 39–117)
BUN: 33 mg/dL — AB (ref 6–23)
CO2: 28 meq/L (ref 19–32)
Calcium: 10 mg/dL (ref 8.4–10.5)
Chloride: 103 mEq/L (ref 96–112)
Creatinine, Ser: 1.41 mg/dL — ABNORMAL HIGH (ref 0.40–1.20)
GFR: 37.61 mL/min — ABNORMAL LOW (ref >60.00)
GLUCOSE: 104 mg/dL — AB (ref 70–99)
POTASSIUM: 4.5 meq/L (ref 3.5–5.1)
SODIUM: 140 meq/L (ref 135–145)
Total Bilirubin: 0.7 mg/dL (ref 0.2–1.2)
Total Protein: 6.9 g/dL (ref 6.0–8.3)

## 2017-07-19 LAB — HEMOGLOBIN A1C: Hgb A1c MFr Bld: 6.4 % (ref 4.6–6.5)

## 2017-07-19 NOTE — Assessment & Plan Note (Signed)
Repeat A1C pending today. Continue glipizide XL 2.5 mg daily. Managed on statin and ACE. Pneumonia and foot exam UTD.  Eye exam UTD.  Recommended to increase vegetables, fruit, whole grains.  Follow up in 6 months.

## 2017-07-19 NOTE — Progress Notes (Signed)
Subjective:   [name] is a 84 y.o. female who presents for Medicare Annual (Subsequent) preventive examination.  Review of Systems:  N/A Cardiac Risk Factors include: advanced age (>55mn, >>57women);hypertension;diabetes mellitus;dyslipidemia     Objective:     Vitals: BP 130/60 (BP Location: Right Arm, Patient Position: Sitting, Cuff Size: Large)   Pulse 62   Temp 98.6 F (37 C) (Oral)   Ht 5' 2.75" (1.594 m) Comment: shoes  Wt 170 lb 8 oz (77.3 kg)   SpO2 92%   BMI 30.44 kg/m   Body mass index is 30.44 kg/m.  Advanced Directives 07/19/2017 07/17/2016 11/03/2014 11/03/2014 10/28/2014  Does Patient Have a Medical Advance Directive? No Yes Yes Yes Yes  Type of Advance Directive - [place] Living will;Healthcare Power of [place] Chart? - No - copy requested No - copy requested No - copy requested No - copy requested  Would patient like information on creating a medical advance directive? Yes (MAU/Ambulatory/Procedural Areas - Information given) - - - -    Tobacco Social History   Tobacco Use  Smoking Status Never Smoker  Smokeless Tobacco Never Used     Counseling given: No   Clinical Intake:  Pre-visit preparation completed: Yes  Pain : No/denies pain Pain Score: 0-No pain     Nutritional Status: BMI 25 -29 Overweight Nutritional Risks: None Diabetes: Yes CBG done?: No Did pt. bring in CBG monitor from home?: No  How often do you need to have someone help you when you read instructions, pamphlets, or other written materials from your doctor or pharmacy?: 1 - Never What is the last grade level you completed in school?: 12th grade  Interpreter Needed?: No  Comments: pt is a widow and lives alone Information entered by :: LPinson, LPN  Past Medical History:  Diagnosis Date  . Arthritis   . Breast  cancer ([place] 2001   left   . CAD (coronary artery disease)    Asymptomatic  . Diabetes mellitus ([place]   . Diverticulosis   . Hiatal hernia   . History of shingles    ON BACK  . Hyperlipidemia   . Hypertension   . PVC (premature ventricular contraction)   . Vertigo    Past Surgical History:  Procedure Laterality Date  . BREAST LUMPECTOMY Left 2001  . BREAST LUMPECTOMY WITH AXILLARY LYMPH NODE DISSECTION  2003   left  . CARDIAC CATHETERIZATION  07/13/2009   Recommendation - medical therapy  . CARDIOVASCULAR STRESS TEST  06/16/2009   Mild ischemia demonstrated in the basal inferior, mid inferior, and apical inferior regions.  .Marland KitchenCATARACTS REMOVED    . CHOLECYSTECTOMY    . JOINT REPLACEMENT     BILATERAL TOTAL KNEES  . KNEE ARTHROPLASTY Right 2009  . TOTAL HIP ARTHROPLASTY Left 11/03/2014   Procedure: LEFT TOTAL HIP ARTHROPLASTY ANTERIOR APPROACH;  Surgeon: FGaynelle Arabian MD;  [facility];  Service: Orthopedics;  Laterality: Left;  . TRANSTHORACIC ECHOCARDIOGRAM  06/16/2009   EF >55%, no significant valvular abnormalities.   Family History  Problem Relation Age of Onset  . Heart attack Mother   . Cancer Mother   . Stroke Father   . Stroke Brother   . Stroke Brother    Social History   Socioeconomic History  . Marital status: Widowed    Spouse name: Not on file  .  Number of children: Not on file  . Years of education: Not on file  . Highest education level: Not on file  Occupational History  . Not on file  Social Needs  . Financial resource strain: Not on file  . Food insecurity:    Worry: Not on file    Inability: Not on file  . Transportation needs:    Medical: Not on file    Non-medical: Not on file  Tobacco Use  . Smoking status: Never Smoker  . Smokeless tobacco: Never Used  Substance and Sexual Activity  . Alcohol use: No  . Drug use: No  . Sexual activity: Not Currently  Lifestyle  . Physical activity:    Days per week: Not on file    Minutes per  session: Not on file  . Stress: Not on file  Relationships  . Social connections:    Talks on phone: Not on file    Gets together: Not on file    Attends religious service: Not on file    Active member of club or organization: Not on file    Attends meetings of clubs or organizations: Not on file    Relationship status: Not on file  Other Topics Concern  . Not on file  Social History Narrative   Widow.    Lives by herself at home.   Volunteers at [facility].   Enjoys volunteering, helping her neighbors, spending time with family, church.    Outpatient Encounter Medications as of 07/19/2017  Medication Sig  . aspirin 81 MG tablet Take 81 mg by mouth daily.  Marland Kitchen atorvastatin (LIPITOR) 10 MG tablet TAKE 1 TABLET BY MOUTH ONCE DAILY  . benazepril-hydrochlorthiazide (LOTENSIN HCT) 10-12.5 MG tablet TAKE 1 TABLET BY MOUTH ONCE DAILY  . Blood Glucose Monitoring Suppl (ONE TOUCH ULTRA 2) w/Device KIT Check blood sugar 1 - 2 times daily and as directed. Dx: E11.9  . GLIPIZIDE XL 2.5 MG 24 hr tablet TAKE 1 TABLET BY MOUTH ONCE A DAY WITH BREAKFAST.  Marland Kitchen glucose blood (ONE TOUCH ULTRA TEST) test strip CHECK BLOOD SUGAR 1 OR 2 TIMES A DAY ANDAS DIRECTED  . meclizine (ANTIVERT) 25 MG tablet Take 1 tablet (25 mg total) by mouth 2 (two) times daily as needed for dizziness.  . [DISCONTINUED] azithromycin (ZITHROMAX) 250 MG tablet Take 2 tablets by mouth today, then 1 tablet daily for 4 additional days.  . [DISCONTINUED] benzonatate (TESSALON) 200 MG capsule Take 1 capsule (200 mg total) by mouth 3 (three) times daily as needed for cough.   No facility-administered encounter medications on file as of 07/19/2017.     Activities of Daily Living In your present state of health, do you have any difficulty performing the following activities: 07/19/2017  Hearing? N  Vision? N  Difficulty concentrating or making decisions? N  Walking or climbing stairs? N  Dressing or bathing? N  Doing errands, shopping? N    Preparing Food and eating ? N  Using the Toilet? N  In the past six months, have you accidently leaked urine? N  Do you have problems with loss of bowel control? N  Managing your Medications? N  Managing your Finances? N  Housekeeping or managing your Housekeeping? N  Some recent data might be hidden    Patient Care Team: Pleas Koch, NP as PCP - General (Internal Medicine)    Assessment:   This is a routine wellness examination for [name].  Exercise Activities and Dietary recommendations Current Exercise Habits:  The patient does not participate in regular exercise at present, Exercise limited by: None identified  Goals    . DIET - INCREASE WATER INTAKE     Starting 07/19/2017, I will continue to drink at least 6-8 glasses of water daily.        Fall Risk Fall Risk  07/19/2017 07/17/2016 06/30/2015  Falls in the past year? No No No   Depression Screen PHQ 2/9 Scores 07/19/2017 07/17/2016 06/30/2015  PHQ - 2 Score 0 0 0  PHQ- 9 Score 0 - -     Cognitive Function MMSE - Mini Mental State Exam 07/19/2017 07/17/2016  Orientation to time 5 5  Orientation to Place 5 5  Registration 3 3  Attention/ Calculation 0 0  Recall 3 3  Language- name 2 objects 0 0  Language- repeat 1 1  Language- follow 3 step command 3 3  Language- read & follow direction 0 0  Write a sentence 0 0  Copy design 0 0  Total score 20 20       PLEASE NOTE: A Mini-Cog screen was completed. Maximum score is 20. A value of 0 denotes this part of Folstein MMSE was not completed or the patient failed this part of the Mini-Cog screening.   Mini-Cog Screening Orientation to Time - Max 5 pts Orientation to Place - Max 5 pts Registration - Max 3 pts Recall - Max 3 pts Language Repeat - Max 1 pts Language Follow 3 Step Command - Max 3 pts   Immunization History  Administered Date(s) Administered  . Influenza-Unspecified 10/22/2016  . Pneumococcal Conjugate-13 06/30/2015  . Pneumococcal  Polysaccharide-23 07/17/2016   Screening Tests Health Maintenance  Topic Date Due  . TETANUS/TDAP  07/18/2026 (Originally 10/11/1948)  . INFLUENZA VACCINE  09/12/2017  . OPHTHALMOLOGY EXAM  09/12/2017  . FOOT EXAM  01/16/2018  . HEMOGLOBIN A1C  01/18/2018  . DEXA SCAN  Completed  . PNA vac Low Risk Adult  Completed       Plan:     I have personally reviewed, addressed, and noted the following in the patient's chart:  A. Medical and social history B. Use of alcohol, tobacco or illicit drugs  C. Current medications and supplements D. Functional ability and status E.  Nutritional status F.  Physical activity G. Advance directives H. List of other physicians I.  Hospitalizations, surgeries, and ER visits in previous 12 months J.  [place] to include hearing, vision, cognitive, depression L. Referrals and appointments - none  In addition, I have reviewed and discussed with patient certain preventive protocols, quality metrics, and best practice recommendations. A written personalized care plan for preventive services as well as general preventive health recommendations were provided to patient.  See attached scanned questionnaire for additional information.   Signed,   Lindell Noe, MHA, BS, LPN Health Coach

## 2017-07-19 NOTE — Progress Notes (Signed)
PCP notes:   Health maintenance:  A1C - completed  Abnormal screenings:   Hearing - failed  Hearing Screening   125Hz  250Hz  500Hz  1000Hz  2000Hz  3000Hz  4000Hz  6000Hz  8000Hz   Right ear:   0 0 40  0    Left ear:   0 0 40  0     Patient concerns:   None  Nurse concerns:  None  Next PCP appt:   07/19/17 @ 1115

## 2017-07-19 NOTE — Assessment & Plan Note (Signed)
Stable in the office today, continue benazepril-HCTZ. CMP pending.

## 2017-07-19 NOTE — Assessment & Plan Note (Signed)
Denies chest pain. Continue statin, aspirin, and BP control.

## 2017-07-19 NOTE — Assessment & Plan Note (Signed)
Repeat lipid panel pending today. Continue atorvastatin.  

## 2017-07-19 NOTE — Progress Notes (Signed)
I reviewed health advisor's note, was available for consultation, and agree with documentation and plan.  

## 2017-07-19 NOTE — Patient Instructions (Signed)
Ms. Farone , Thank you for taking time to come for your Medicare Wellness Visit. I appreciate your ongoing commitment to your health goals. Please review the following plan we discussed and let me know if I can assist you in the future.   These are the goals we discussed: Goals    . DIET - INCREASE WATER INTAKE     Starting 07/19/2017, I will continue to drink at least 6-8 glasses of water daily.        This is a list of the screening recommended for you and due dates:  Health Maintenance  Topic Date Due  . Tetanus Vaccine  07/18/2026*  . Flu Shot  09/12/2017  . Eye exam for diabetics  09/12/2017  . Complete foot exam   01/16/2018  . Hemoglobin A1C  01/18/2018  . DEXA scan (bone density measurement)  Completed  . Pneumonia vaccines  Completed  *Topic was postponed. The date shown is not the original due date.   Preventive Care for Adults  A healthy lifestyle and preventive care can promote health and wellness. Preventive health guidelines for adults include the following key practices.  . A routine yearly physical is a good way to check with your health care provider about your health and preventive screening. It is a chance to share any concerns and updates on your health and to receive a thorough exam.  . Visit your dentist for a routine exam and preventive care every 6 months. Brush your teeth twice a day and floss once a day. Good oral hygiene prevents tooth decay and gum disease.  . The frequency of eye exams is based on your age, health, family medical history, use  of contact lenses, and other factors. Follow your health care provider's recommendations for frequency of eye exams.  . Eat a healthy diet. Foods like vegetables, fruits, whole grains, low-fat dairy products, and lean protein foods contain the nutrients you need without too many calories. Decrease your intake of foods high in solid fats, added sugars, and salt. Eat the right amount of calories for you. Get information  about a proper diet from your health care provider, if necessary.  . Regular physical exercise is one of the most important things you can do for your health. Most adults should get at least 150 minutes of moderate-intensity exercise (any activity that increases your heart rate and causes you to sweat) each week. In addition, most adults need muscle-strengthening exercises on 2 or more days a week.  Silver Sneakers may be a benefit available to you. To determine eligibility, you may visit the website: www.silversneakers.com or contact program at 518-699-3005 Mon-Fri between 8AM-8PM.   . Maintain a healthy weight. The body mass index (BMI) is a screening tool to identify possible weight problems. It provides an estimate of body fat based on height and weight. Your health care provider can find your BMI and can help you achieve or maintain a healthy weight.   For adults 20 years and older: ? A BMI below 18.5 is considered underweight. ? A BMI of 18.5 to 24.9 is normal. ? A BMI of 25 to 29.9 is considered overweight. ? A BMI of 30 and above is considered obese.   . Maintain normal blood lipids and cholesterol levels by exercising and minimizing your intake of saturated fat. Eat a balanced diet with plenty of fruit and vegetables. Blood tests for lipids and cholesterol should begin at age 65 and be repeated every 5 years. If your lipid  or cholesterol levels are high, you are over 50, or you are at high risk for heart disease, you may need your cholesterol levels checked more frequently. Ongoing high lipid and cholesterol levels should be treated with medicines if diet and exercise are not working.  . If you smoke, find out from your health care provider how to quit. If you do not use tobacco, please do not start.  . If you choose to drink alcohol, please do not consume more than 2 drinks per day. One drink is considered to be 12 ounces (355 mL) of beer, 5 ounces (148 mL) of wine, or 1.5 ounces (44  mL) of liquor.  . If you are 3-36 years old, ask your health care provider if you should take aspirin to prevent strokes.  . Use sunscreen. Apply sunscreen liberally and repeatedly throughout the day. You should seek shade when your shadow is shorter than you. Protect yourself by wearing long sleeves, pants, a wide-brimmed hat, and sunglasses year round, whenever you are outdoors.  . Once a month, do a whole body skin exam, using a mirror to look at the skin on your back. Tell your health care provider of new moles, moles that have irregular borders, moles that are larger than a pencil eraser, or moles that have changed in shape or color.

## 2017-07-19 NOTE — Progress Notes (Signed)
Subjective:    Patient ID: [name], female    DOB: [birth year], 84 y.o.   MRN: [id number]  HPI  [name] is an 84 year old female who presents today for [place] Part 2. She was evaluated by our Health Advisor this morning.  1) Type 2 Diabetes:  Current medications include: Glipizide XL 2.5 mg  She is checking her blood glucose once daily and is getting readings of: AM fasting 120-130's  Highest reading: 145 Lowest reading: 120  Last A1C: 6.2 in December 2018, pending today Last Eye Exam: Completed this year Last Foot Exam: Due in December 2019 Pneumonia Vaccination: Completed in 2018 ACE/ARB: Benazepril  Statin: Atorvastatin 10 mg  Diet currently consists of:  Breakfast: Cereal, fruit Lunch: Vegetable, protein  Dinner: Soup, sandwich Snacks: Crackers, sugar free cookie Desserts: 1-2 times weekly Beverages: Some water, flavored water  Exercise: She is not currently exercising    2) Essential Hypertension: Currently managed on benazepril-HCTZ 10-12.5 mg. She denies chest pain, dizziness, headaches.  BP Readings from Last 3 Encounters:  07/19/17 130/60  07/19/17 130/60  05/06/17 130/84    3) CAD/Hyperlipidemia: Currently managed on atorvastatin 10 mg and aspirin 81 mg. She denies myalgias.    Review of Systems  Constitutional: Negative for unexpected weight change.  HENT: Negative for rhinorrhea.   Respiratory: Negative for cough and shortness of breath.   Cardiovascular: Negative for chest pain.  Gastrointestinal: Negative for constipation and diarrhea.  Genitourinary: Negative for difficulty urinating.  Musculoskeletal: Positive for arthralgias. Negative for myalgias.  Skin: Negative for rash.  Allergic/Immunologic: Negative for environmental allergies.  Neurological: Negative for dizziness, numbness and headaches.  Psychiatric/Behavioral: The patient is not nervous/anxious.        Past Medical History:  Diagnosis Date  . Arthritis   . Breast  cancer ([place]) 2001   left   . CAD (coronary artery disease)    Asymptomatic  . Diabetes mellitus ([name])   . Diverticulosis   . Hiatal hernia   . History of shingles    ON BACK  . Hyperlipidemia   . Hypertension   . PVC (premature ventricular contraction)   . Vertigo      Social History   Socioeconomic History  . Marital status: Widowed    Spouse name: Not on file  . Number of children: Not on file  . Years of education: Not on file  . Highest education level: Not on file  Occupational History  . Not on file  Social Needs  . Financial resource strain: Not on file  . Food insecurity:    Worry: Not on file    Inability: Not on file  . Transportation needs:    Medical: Not on file    Non-medical: Not on file  Tobacco Use  . Smoking status: Never Smoker  . Smokeless tobacco: Never Used  Substance and Sexual Activity  . Alcohol use: No  . Drug use: No  . Sexual activity: Not Currently  Lifestyle  . Physical activity:    Days per week: Not on file    Minutes per session: Not on file  . Stress: Not on file  Relationships  . Social connections:    Talks on phone: Not on file    Gets together: Not on file    Attends religious service: Not on file    Active member of club or organization: Not on file    Attends meetings of clubs or organizations: Not on file  Relationship status: Not on file  . Intimate partner violence:    Fear of current or ex partner: Not on file    Emotionally abused: Not on file    Physically abused: Not on file    Forced sexual activity: Not on file  Other Topics Concern  . Not on file  Social History Narrative   Widow.    Lives by herself at home.   Volunteers at [facility].   Enjoys volunteering, helping her neighbors, spending time with family, church.    Past Surgical History:  Procedure Laterality Date  . BREAST LUMPECTOMY Left 2001  . BREAST LUMPECTOMY WITH AXILLARY LYMPH NODE DISSECTION  2003   left  . CARDIAC CATHETERIZATION   07/13/2009   Recommendation - medical therapy  . CARDIOVASCULAR STRESS TEST  06/16/2009   Mild ischemia demonstrated in the basal inferior, mid inferior, and apical inferior regions.  [name] CATARACTS REMOVED    . CHOLECYSTECTOMY    . JOINT REPLACEMENT     BILATERAL TOTAL KNEES  . KNEE ARTHROPLASTY Right 2009  . TOTAL HIP ARTHROPLASTY Left 11/03/2014   Procedure: LEFT TOTAL HIP ARTHROPLASTY ANTERIOR APPROACH;  Surgeon: Gaynelle Arabian, MD;  Location: [facility];  Service: Orthopedics;  Laterality: Left;  . TRANSTHORACIC ECHOCARDIOGRAM  06/16/2009   EF >55%, no significant valvular abnormalities.    Family History  Problem Relation Age of Onset  . Heart attack Mother   . Cancer Mother   . Stroke Father   . Stroke Brother   . Stroke Brother     No Known Allergies  Current Outpatient Medications on File Prior to Visit  Medication Sig Dispense Refill  . aspirin 81 MG tablet Take 81 mg by mouth daily.    [name] atorvastatin (LIPITOR) 10 MG tablet TAKE 1 TABLET BY MOUTH ONCE DAILY 90 tablet 1  . benazepril-hydrochlorthiazide (LOTENSIN HCT) 10-12.5 MG tablet TAKE 1 TABLET BY MOUTH ONCE DAILY 90 tablet 0  . Blood Glucose Monitoring Suppl (ONE TOUCH ULTRA 2) w/Device KIT Check blood sugar 1 - 2 times daily and as directed. Dx: E11.9 1 each 0  . GLIPIZIDE XL 2.5 MG 24 hr tablet TAKE 1 TABLET BY MOUTH ONCE A DAY WITH BREAKFAST. 90 tablet 0  . glucose blood (ONE TOUCH ULTRA TEST) test strip CHECK BLOOD SUGAR 1 OR 2 TIMES A DAY ANDAS DIRECTED 100 each 1  . meclizine (ANTIVERT) 25 MG tablet Take 1 tablet (25 mg total) by mouth 2 (two) times daily as needed for dizziness. 30 tablet 1   No current facility-administered medications on file prior to visit.     BP 130/60 (BP Location: Right Arm, Patient Position: Sitting, Cuff Size: Large)   Pulse 62   Temp 98.6 F (37 C) (Oral)   Ht 5' 2.75" (1.594 m) Comment: shoes  Wt 170 lb 8 oz (77.3 kg)   SpO2 92%   BMI 30.44 kg/m    Objective:   Physical Exam    Constitutional: She is oriented to person, place, and time. She appears well-nourished.  HENT:  Mouth/Throat: No oropharyngeal exudate.  Eyes: Pupils are equal, round, and reactive to light. EOM are normal.  Neck: Neck supple.  Cardiovascular: Normal rate and regular rhythm.  Respiratory: Effort normal and breath sounds normal.  GI: Soft. Bowel sounds are normal. There is no tenderness.  Neurological: She is alert and oriented to person, place, and time.  Skin: Skin is warm and dry.  Psychiatric: She has a normal mood and affect.  Assessment & Plan:

## 2017-07-19 NOTE — Assessment & Plan Note (Signed)
No recent dizzy spells. Using meclizine infrequently. Exam today unremarkable.

## 2017-07-19 NOTE — Assessment & Plan Note (Signed)
Overall stable. Ambulates without assistive device.

## 2017-07-19 NOTE — Patient Instructions (Addendum)
Start exercising. You should be getting 150 minutes of exercise weekly.  Increase consumption of vegetables, fruit, whole grains.  Ensure you are consuming 64 ounces of water daily.  Please schedule a follow up appointment in 6 months for diabetes check.   It was a pleasure to see you today!

## 2017-07-23 ENCOUNTER — Other Ambulatory Visit: Payer: Self-pay | Admitting: Primary Care

## 2017-07-23 DIAGNOSIS — I10 Essential (primary) hypertension: Secondary | ICD-10-CM

## 2017-07-23 MED ORDER — BENAZEPRIL HCL 20 MG PO TABS: ORAL_TABLET | ORAL | 0 refills | Status: DC

## 2017-08-07 ENCOUNTER — Ambulatory Visit (INDEPENDENT_AMBULATORY_CARE_PROVIDER_SITE_OTHER): Payer: Medicare Other | Admitting: Primary Care

## 2017-08-07 VITALS — BP 124/76 | HR 64 | Temp 98.1°F | Ht 62.75 in | Wt 171.8 lb

## 2017-08-07 DIAGNOSIS — I2510 Atherosclerotic heart disease of native coronary artery without angina pectoris: Secondary | ICD-10-CM | POA: Diagnosis not present

## 2017-08-07 DIAGNOSIS — I10 Essential (primary) hypertension: Secondary | ICD-10-CM | POA: Diagnosis not present

## 2017-08-07 LAB — BASIC METABOLIC PANEL
BUN: 23 mg/dL (ref 6–23)
CALCIUM: 9.7 mg/dL (ref 8.4–10.5)
CO2: 27 meq/L (ref 19–32)
CREATININE: 1.34 mg/dL — AB (ref 0.40–1.20)
Chloride: 106 mEq/L (ref 96–112)
GFR: 39.88 mL/min — ABNORMAL LOW (ref >60.00)
Glucose, Bld: 120 mg/dL — ABNORMAL HIGH (ref 70–99)
Potassium: 4.6 mEq/L (ref 3.5–5.1)
Sodium: 141 mEq/L (ref 135–145)

## 2017-08-07 MED ORDER — BENAZEPRIL HCL 20 MG PO TABS: ORAL_TABLET | ORAL | 2 refills | Status: DC

## 2017-08-07 NOTE — Progress Notes (Signed)
Subjective:    Patient ID: [name], female    DOB: [birth year], 84 y.o.   MRN: [id number]  HPI  [name] is a 84 year old female who presents today for follow up of hypertension. She was last evaluated in early June 2019 with stable blood pressure, however, her BMP returned with a gradual increase in creatine levels over the past year. Because of this finding her HCTZ was discontinued and her benazepril was increased.  BP Readings from Last 3 Encounters:  08/07/17 124/76  07/19/17 130/60  07/19/17 130/60   Since her last visit she's doing well. She denies dizziness, chest pain, shortness of breath, cough.  Review of Systems  Constitutional: Negative for fatigue.  Respiratory: Negative for cough and shortness of breath.   Cardiovascular: Negative for chest pain.  Neurological: Negative for dizziness and headaches.       Past Medical History:  Diagnosis Date  . Arthritis   . Breast cancer ([place]) 2001   left   . CAD (coronary artery disease)    Asymptomatic  . Diabetes mellitus ([place])   . Diverticulosis   . Hiatal hernia   . History of shingles    ON BACK  . Hyperlipidemia   . Hypertension   . PVC (premature ventricular contraction)   . Vertigo      Social History   Socioeconomic History  . Marital status: Widowed    Spouse name: Not on file  . Number of children: Not on file  . Years of education: Not on file  . Highest education level: Not on file  Occupational History  . Not on file  Social Needs  . Financial resource strain: Not on file  . Food insecurity:    Worry: Not on file    Inability: Not on file  . Transportation needs:    Medical: Not on file    Non-medical: Not on file  Tobacco Use  . Smoking status: Never Smoker  . Smokeless tobacco: Never Used  Substance and Sexual Activity  . Alcohol use: No  . Drug use: No  . Sexual activity: Not Currently  Lifestyle  . Physical activity:    Days per week: Not on file    Minutes per session:  Not on file  . Stress: Not on file  Relationships  . Social connections:    Talks on phone: Not on file    Gets together: Not on file    Attends religious service: Not on file    Active member of club or organization: Not on file    Attends meetings of clubs or organizations: Not on file    Relationship status: Not on file  . Intimate partner violence:    Fear of current or ex partner: Not on file    Emotionally abused: Not on file    Physically abused: Not on file    Forced sexual activity: Not on file  Other Topics Concern  . Not on file  Social History Narrative   Widow.    Lives by herself at home.   Volunteers at [facility]  [employer].   Enjoys volunteering, helping her neighbors, spending time with family, church.    Past Surgical History:  Procedure Laterality Date  . BREAST LUMPECTOMY Left 2001  . BREAST LUMPECTOMY WITH AXILLARY LYMPH NODE DISSECTION  2003   left  . CARDIAC CATHETERIZATION  07/13/2009   Recommendation - medical therapy  . CARDIOVASCULAR STRESS TEST  06/16/2009   Mild ischemia demonstrated in the basal  inferior, mid inferior, and apical inferior regions.  Marland Kitchen CATARACTS REMOVED    . CHOLECYSTECTOMY    . JOINT REPLACEMENT     BILATERAL TOTAL KNEES  . KNEE ARTHROPLASTY Right 2009  . TOTAL HIP ARTHROPLASTY Left 11/03/2014   Procedure: LEFT TOTAL HIP ARTHROPLASTY ANTERIOR APPROACH;  Surgeon: Gaynelle Arabian, MD;  Location: [facility];  Service: Orthopedics;  Laterality: Left;  . TRANSTHORACIC ECHOCARDIOGRAM  06/16/2009   EF >55%, no significant valvular abnormalities.    Family History  Problem Relation Age of Onset  . Heart attack Mother   . Cancer Mother   . Stroke Father   . Stroke Brother   . Stroke Brother     No Known Allergies  Current Outpatient Medications on File Prior to Visit  Medication Sig Dispense Refill  . aspirin 81 MG tablet Take 81 mg by mouth daily.    Marland Kitchen atorvastatin (LIPITOR) 10 MG tablet TAKE 1 TABLET BY MOUTH ONCE DAILY 90 tablet 1  . Blood Glucose  Monitoring Suppl (ONE TOUCH ULTRA 2) w/Device KIT Check blood sugar 1 - 2 times daily and as directed. Dx: E11.9 1 each 0  . GLIPIZIDE XL 2.5 MG 24 hr tablet TAKE 1 TABLET BY MOUTH ONCE A DAY WITH BREAKFAST. 90 tablet 0  . glucose blood (ONE TOUCH ULTRA TEST) test strip CHECK BLOOD SUGAR 1 OR 2 TIMES A DAY ANDAS DIRECTED 100 each 1  . meclizine (ANTIVERT) 25 MG tablet Take 1 tablet (25 mg total) by mouth 2 (two) times daily as needed for dizziness. 30 tablet 1   No current facility-administered medications on file prior to visit.     BP 124/76   Pulse 64   Temp 98.1 F (36.7 C) (Oral)   Ht 5' 2.75" (1.594 m)   Wt 171 lb 12 oz (77.9 kg)   SpO2 96%   BMI 30.67 kg/m    Objective:   Physical Exam  Constitutional: She appears well-nourished.  Neck: Neck supple.  Cardiovascular: Normal rate and regular rhythm.  Respiratory: Effort normal and breath sounds normal.  Skin: Skin is warm and dry.           Assessment & Plan:

## 2017-08-07 NOTE — Patient Instructions (Addendum)
Stop by the lab prior to leaving today. I will notify you of your results once received.   Continue taking benazepril 20 mg tablets for high blood pressure, I sent refills to the pharmacy.  It was a pleasure to see you today!

## 2017-08-07 NOTE — Assessment & Plan Note (Signed)
Stable in the office today on benazepril 20 mg. Repeat BMP pending. Refill sent to pharmacy. Follow up as needed.

## 2017-10-03 ENCOUNTER — Other Ambulatory Visit: Payer: Self-pay | Admitting: Primary Care

## 2017-10-03 DIAGNOSIS — E785 Hyperlipidemia, unspecified: Secondary | ICD-10-CM

## 2017-10-03 DIAGNOSIS — E119 Type 2 diabetes mellitus without complications: Secondary | ICD-10-CM

## 2017-12-09 ENCOUNTER — Other Ambulatory Visit: Payer: Self-pay | Admitting: Primary Care

## 2017-12-17 ENCOUNTER — Other Ambulatory Visit: Payer: Self-pay | Admitting: Primary Care

## 2017-12-17 DIAGNOSIS — E119 Type 2 diabetes mellitus without complications: Secondary | ICD-10-CM

## 2018-01-15 ENCOUNTER — Other Ambulatory Visit: Payer: Self-pay | Admitting: Primary Care

## 2018-01-15 DIAGNOSIS — Z1231 Encounter for screening mammogram for malignant neoplasm of breast: Secondary | ICD-10-CM

## 2018-01-27 ENCOUNTER — Encounter: Payer: Self-pay | Admitting: Primary Care

## 2018-01-27 ENCOUNTER — Ambulatory Visit (INDEPENDENT_AMBULATORY_CARE_PROVIDER_SITE_OTHER): Payer: Medicare Other | Admitting: Primary Care

## 2018-01-27 VITALS — BP 134/84 | HR 72 | Temp 98.0°F | Ht 62.75 in | Wt 173.8 lb

## 2018-01-27 DIAGNOSIS — I10 Essential (primary) hypertension: Secondary | ICD-10-CM | POA: Diagnosis not present

## 2018-01-27 DIAGNOSIS — E119 Type 2 diabetes mellitus without complications: Secondary | ICD-10-CM | POA: Diagnosis not present

## 2018-01-27 DIAGNOSIS — I2510 Atherosclerotic heart disease of native coronary artery without angina pectoris: Secondary | ICD-10-CM

## 2018-01-27 LAB — BASIC METABOLIC PANEL
BUN: 27 mg/dL — ABNORMAL HIGH (ref 6–23)
CALCIUM: 9.9 mg/dL (ref 8.4–10.5)
CHLORIDE: 103 meq/L (ref 96–112)
CO2: 27 mEq/L (ref 19–32)
CREATININE: 1.39 mg/dL — AB (ref 0.40–1.20)
GFR: 38.18 mL/min — ABNORMAL LOW (ref >60.00)
Glucose, Bld: 133 mg/dL — ABNORMAL HIGH (ref 70–99)
Potassium: 4.3 mEq/L (ref 3.5–5.1)
Sodium: 140 mEq/L (ref 135–145)

## 2018-01-27 LAB — HEMOGLOBIN A1C: HEMOGLOBIN A1C: 6.3 % (ref 4.6–6.5)

## 2018-01-27 NOTE — Progress Notes (Signed)
Subjective:    Patient ID: [name], female    DOB: 04-Jan-1930, 84 y.o.   MRN: [id number]  HPI  [name] is an 84 year old female who presents today for follow up of diabetes.  Current medications include: Glipizide ER 2.5 mg   She is checking her blood glucose once daily and is getting readings of: AM fasting: 120-130  Highest reading: 140 Lowest reading: 113  Last A1C: 6.4 Last Eye Exam: No exam in 2019 Last Foot Exam: Due Pneumonia Vaccination: Completed in 2018 ACE/ARB: Benazepril  Statin: Atorvastat  Diet currently consists of:  Breakfast: Cereal with fruit Lunch: Chicken, vegetables  Dinner: Meat, vegetables Snacks: Popcorn  Desserts: Occasionally  Beverages: Water, occasionally coffee  Exercise: She is not exercising.    BP Readings from Last 3 Encounters:  01/27/18 134/84  08/07/17 124/76  07/19/17 130/60     Review of Systems  Respiratory: Negative for shortness of breath.   Cardiovascular: Negative for chest pain.  Neurological: Negative for dizziness.       Numbness to left plantar foot       Past Medical History:  Diagnosis Date  . Arthritis   . Breast cancer ([place]) 2001   left   . CAD (coronary artery disease)    Asymptomatic  . Diabetes mellitus ([place])   . Diverticulosis   . Hiatal hernia   . History of shingles    ON BACK  . Hyperlipidemia   . Hypertension   . PVC (premature ventricular contraction)   . Vertigo      Social History   Socioeconomic History  . Marital status: Widowed    Spouse name: Not on file  . Number of children: Not on file  . Years of education: Not on file  . Highest education level: Not on file  Occupational History  . Not on file  Social Needs  . Financial resource strain: Not on file  . Food insecurity:    Worry: Not on file    Inability: Not on file  . Transportation needs:    Medical: Not on file    Non-medical: Not on file  Tobacco Use  . Smoking status: Never Smoker  . Smokeless  tobacco: Never Used  Substance and Sexual Activity  . Alcohol use: No  . Drug use: No  . Sexual activity: Not Currently  Lifestyle  . Physical activity:    Days per week: Not on file    Minutes per session: Not on file  . Stress: Not on file  Relationships  . Social connections:    Talks on phone: Not on file    Gets together: Not on file    Attends religious service: Not on file    Active member of club or organization: Not on file    Attends meetings of clubs or organizations: Not on file    Relationship status: Not on file  . Intimate partner violence:    Fear of current or ex partner: Not on file    Emotionally abused: Not on file    Physically abused: Not on file    Forced sexual activity: Not on file  Other Topics Concern  . Not on file  Social History Narrative   Widow.    Lives by herself at home.   Volunteers at [employer].   Enjoys volunteering, helping her neighbors, spending time with family, church.    Past Surgical History:  Procedure Laterality Date  . BREAST LUMPECTOMY Left 2001  .  BREAST LUMPECTOMY WITH AXILLARY LYMPH NODE DISSECTION  2003   left  . CARDIAC CATHETERIZATION  07/13/2009   Recommendation - medical therapy  . CARDIOVASCULAR STRESS TEST  06/16/2009   Mild ischemia demonstrated in the basal inferior, mid inferior, and apical inferior regions.  Marland Kitchen CATARACTS REMOVED    . CHOLECYSTECTOMY    . JOINT REPLACEMENT     BILATERAL TOTAL KNEES  . KNEE ARTHROPLASTY Right 2009  . TOTAL HIP ARTHROPLASTY Left 11/03/2014   Procedure: LEFT TOTAL HIP ARTHROPLASTY ANTERIOR APPROACH;  Surgeon: Gaynelle Arabian, MD;  Location: [facility];  Service: Orthopedics;  Laterality: Left;  . TRANSTHORACIC ECHOCARDIOGRAM  06/16/2009   EF >55%, no significant valvular abnormalities.    Family History  Problem Relation Age of Onset  . Heart attack Mother   . Cancer Mother   . Stroke Father   . Stroke Brother   . Stroke Brother     No Known Allergies  Current Outpatient  Medications on File Prior to Visit  Medication Sig Dispense Refill  . aspirin 81 MG tablet Take 81 mg by mouth daily.    Marland Kitchen atorvastatin (LIPITOR) 10 MG tablet TAKE 1 TABLET BY MOUTH ONCE DAILY 90 tablet 1  . benazepril (LOTENSIN) 20 MG tablet Take 1 tablet by mouth once daily for blood pressure. 90 tablet 2  . Blood Glucose Monitoring Suppl (ONE TOUCH ULTRA 2) w/Device KIT Check blood sugar 1 - 2 times daily and as directed. Dx: E11.9 1 each 0  . glipiZIDE (GLUCOTROL XL) 2.5 MG 24 hr tablet TAKE 1 TABLET BY MOUTH ONCE A DAY WITH BREAKFAST 90 tablet 0  . glucose blood (ONE TOUCH ULTRA TEST) test strip USE AS DIRECTED TO CHECK BLOOD SUGAR 1 TO 2 TIMES DAILY AND AS DIRECTED 100 each 1  . meclizine (ANTIVERT) 25 MG tablet Take 1 tablet (25 mg total) by mouth 2 (two) times daily as needed for dizziness. 30 tablet 1   No current facility-administered medications on file prior to visit.     BP 134/84   Pulse 72   Temp 98 F (36.7 C) (Oral)   Ht 5' 2.75" (1.594 m)   Wt 173 lb 12 oz (78.8 kg)   SpO2 94%   BMI 31.02 kg/m    Objective:   Physical Exam  Constitutional: She appears well-nourished.  Neck: Neck supple.  Cardiovascular: Normal rate and regular rhythm.  Respiratory: Effort normal and breath sounds normal.  Skin: Skin is warm and dry.           Assessment & Plan:

## 2018-01-27 NOTE — Assessment & Plan Note (Signed)
Repeat A1C pending. Recommended to work on diet, start exercising. Foot exam today. Recommended annual eye exam. Pneumonia vaccination UTD. Managed on ace and statin.  Follow up in 6 months. Continue Glipizide.

## 2018-01-27 NOTE — Assessment & Plan Note (Signed)
Stable in the office today, repeat BMP pending. Continue benazepril.

## 2018-01-27 NOTE — Patient Instructions (Addendum)
Stop by the lab prior to leaving today. I will notify you of your results once received.   Schedule an eye exam for diabetes, this should be done annually.  Continue glipizide medication for diabetes.  Start exercising. You should be getting 150 minutes of exercise weekly.  We will see you in June for your physical. It was a pleasure to see you today!

## 2018-01-30 ENCOUNTER — Other Ambulatory Visit: Payer: Self-pay | Admitting: Primary Care

## 2018-01-30 DIAGNOSIS — I10 Essential (primary) hypertension: Secondary | ICD-10-CM

## 2018-01-30 MED ORDER — AMLODIPINE BESYLATE 10 MG PO TABS: 10.0000 mg | ORAL_TABLET | Freq: Every day | ORAL | 0 refills | Status: DC

## 2018-02-19 ENCOUNTER — Encounter: Payer: Self-pay | Admitting: Primary Care

## 2018-02-19 ENCOUNTER — Ambulatory Visit: Payer: Medicare Other | Admitting: Primary Care

## 2018-02-19 VITALS — BP 124/72 | HR 72 | Temp 98.0°F | Ht 62.75 in | Wt 174.2 lb

## 2018-02-19 DIAGNOSIS — I10 Essential (primary) hypertension: Secondary | ICD-10-CM | POA: Diagnosis not present

## 2018-02-19 DIAGNOSIS — N183 Chronic kidney disease, stage 3 unspecified: Secondary | ICD-10-CM | POA: Insufficient documentation

## 2018-02-19 LAB — BASIC METABOLIC PANEL
BUN: 19 mg/dL (ref 6–23)
CHLORIDE: 105 meq/L (ref 96–112)
CO2: 28 mEq/L (ref 19–32)
Calcium: 9.6 mg/dL (ref 8.4–10.5)
Creatinine, Ser: 1.34 mg/dL — ABNORMAL HIGH (ref 0.40–1.20)
GFR: 39.83 mL/min — ABNORMAL LOW (ref >60.00)
Glucose, Bld: 127 mg/dL — ABNORMAL HIGH (ref 70–99)
POTASSIUM: 4 meq/L (ref 3.5–5.1)
Sodium: 140 mEq/L (ref 135–145)

## 2018-02-19 MED ORDER — LOSARTAN POTASSIUM 50 MG PO TABS: 50.0000 mg | ORAL_TABLET | Freq: Every day | ORAL | 0 refills | Status: DC

## 2018-02-19 NOTE — Patient Instructions (Addendum)
Stop by the lab prior to leaving today. I will notify you of your results once received.   Stop Amlodipine 10 mg for blood pressure. Start Losartan 50 mg for blood pressure. Take 1 tablet by mouth once daily.   Stop by the front desk and speak with either Rosaria Ferries or Anastasiya regarding your referral to the kidney doctor.  Follow up with either the kidney doctor or myself in 2-3 weeks, whichever comes first.  It was a pleasure to see you today!

## 2018-02-19 NOTE — Assessment & Plan Note (Signed)
Steady decline over the last one year. Continued decline despite removing potentially offending agents. She is not taking NSAID's. Referral placed to nephrology for further evaluation. Repeat BMP pending today.

## 2018-02-19 NOTE — Progress Notes (Signed)
Subjective:    Patient ID: [name], female    DOB: 08-04-1929, 85 y.o.   MRN: [id number]  HPI  [name] is an 85 year old female with a history of CKD, CAD, Hypertension, Diabetes who presents today for follow up of diabetes.  She was last evaluated on 01/27/18 for diabetes. Labs were taken for renal function assessment since coming off of HCTZ in Summer 2019 and she was noted to have worsening renal function. She was maintained on benazepril alone with good BP control at that time. Given the continued decrease in renal function she was switched to Amlodipine.   She is here today for follow up of blood pressure and repeat BMP. Since switching her to Amlodipine she's feeling "jittery", she's also noticed bilateral ankle edema which is uncomfortable. She denies dizziness, shortness of breath.   BP Readings from Last 3 Encounters:  02/19/18 124/72  01/27/18 134/84  08/07/17 124/76     Review of Systems  Respiratory: Negative for shortness of breath.   Cardiovascular: Positive for leg swelling. Negative for chest pain.  Neurological: Negative for dizziness and headaches.       Feels jittery       Past Medical History:  Diagnosis Date  . Arthritis   . Breast cancer ([place]) 2001   left   . CAD (coronary artery disease)    Asymptomatic  . Diabetes mellitus ([place])   . Diverticulosis   . Hiatal hernia   . History of shingles    ON BACK  . Hyperlipidemia   . Hypertension   . PVC (premature ventricular contraction)   . Vertigo      Social History   Socioeconomic History  . Marital status: Widowed    Spouse name: Not on file  . Number of children: Not on file  . Years of education: Not on file  . Highest education level: Not on file  Occupational History  . Not on file  Social Needs  . Financial resource strain: Not on file  . Food insecurity:    Worry: Not on file    Inability: Not on file  . Transportation needs:    Medical: Not on file    Non-medical: Not  on file  Tobacco Use  . Smoking status: Never Smoker  . Smokeless tobacco: Never Used  Substance and Sexual Activity  . Alcohol use: No  . Drug use: No  . Sexual activity: Not Currently  Lifestyle  . Physical activity:    Days per week: Not on file    Minutes per session: Not on file  . Stress: Not on file  Relationships  . Social connections:    Talks on phone: Not on file    Gets together: Not on file    Attends religious service: Not on file    Active member of club or organization: Not on file    Attends meetings of clubs or organizations: Not on file    Relationship status: Not on file  . Intimate partner violence:    Fear of current or ex partner: Not on file    Emotionally abused: Not on file    Physically abused: Not on file    Forced sexual activity: Not on file  Other Topics Concern  . Not on file  Social History Narrative   Widow.    Lives by herself at home.   Volunteers at [facility].   Enjoys volunteering, helping her neighbors, spending time with family, church.  Past Surgical History:  Procedure Laterality Date  . BREAST LUMPECTOMY Left 2001  . BREAST LUMPECTOMY WITH AXILLARY LYMPH NODE DISSECTION  2003   left  . CARDIAC CATHETERIZATION  07/13/2009   Recommendation - medical therapy  . CARDIOVASCULAR STRESS TEST  06/16/2009   Mild ischemia demonstrated in the basal inferior, mid inferior, and apical inferior regions.  Marland Kitchen CATARACTS REMOVED    . CHOLECYSTECTOMY    . JOINT REPLACEMENT     BILATERAL TOTAL KNEES  . KNEE ARTHROPLASTY Right 2009  . TOTAL HIP ARTHROPLASTY Left 11/03/2014   Procedure: LEFT TOTAL HIP ARTHROPLASTY ANTERIOR APPROACH;  Surgeon: Gaynelle Arabian, MD;  Location: [facility];  Service: Orthopedics;  Laterality: Left;  . TRANSTHORACIC ECHOCARDIOGRAM  06/16/2009   EF >55%, no significant valvular abnormalities.    Family History  Problem Relation Age of Onset  . Heart attack Mother   . Cancer Mother   . Stroke Father   . Stroke Brother   .  Stroke Brother     No Known Allergies  Current Outpatient Medications on File Prior to Visit  Medication Sig Dispense Refill  . aspirin 81 MG tablet Take 81 mg by mouth daily.    Marland Kitchen atorvastatin (LIPITOR) 10 MG tablet TAKE 1 TABLET BY MOUTH ONCE DAILY 90 tablet 1  . Blood Glucose Monitoring Suppl (ONE TOUCH ULTRA 2) w/Device KIT Check blood sugar 1 - 2 times daily and as directed. Dx: E11.9 1 each 0  . glipiZIDE (GLUCOTROL XL) 2.5 MG 24 hr tablet TAKE 1 TABLET BY MOUTH ONCE A DAY WITH BREAKFAST 90 tablet 0  . glucose blood (ONE TOUCH ULTRA TEST) test strip USE AS DIRECTED TO CHECK BLOOD SUGAR 1 TO 2 TIMES DAILY AND AS DIRECTED 100 each 1  . meclizine (ANTIVERT) 25 MG tablet Take 1 tablet (25 mg total) by mouth 2 (two) times daily as needed for dizziness. 30 tablet 1   No current facility-administered medications on file prior to visit.     BP 124/72   Pulse 72   Temp 98 F (36.7 C) (Oral)   Ht 5' 2.75" (1.594 m)   Wt 174 lb 4 oz (79 kg)   SpO2 97%   BMI 31.11 kg/m    Objective:   Physical Exam  Constitutional: She appears well-nourished.  Neck: Neck supple.  Cardiovascular: Normal rate and regular rhythm.  Moderate edema to bilateral ankles and feet. No pitting.  Respiratory: Effort normal and breath sounds normal.  Skin: Skin is warm and dry.  Psychiatric: She has a normal mood and affect.           Assessment & Plan:

## 2018-02-19 NOTE — Assessment & Plan Note (Signed)
Stable on Amlodipine 10 mg but cannot tolerate side effects of ankle edema. Repeat BMP today to see if renal function has improved off of ACE. Stop Amlodipine. Will trial Losartan 50 mg. Would not resume HCTZ given potential for renal harm. She is not taking any NSAID's.  Referral placed to nephrology for further evaluation.  Follow up with either myself or nephrology in 2-3 weeks, she verbalized understanding.

## 2018-02-21 ENCOUNTER — Ambulatory Visit
Admission: RE | Admit: 2018-02-21 | Discharge: 2018-02-21 | Disposition: A | Discharge status: Home / Self Care | Payer: Medicare Other | Source: Ambulatory Visit | Attending: Primary Care | Admitting: Primary Care

## 2018-02-21 DIAGNOSIS — Z1231 Encounter for screening mammogram for malignant neoplasm of breast: Secondary | ICD-10-CM

## 2018-03-05 ENCOUNTER — Ambulatory Visit: Payer: Medicare Other | Admitting: Primary Care

## 2018-03-05 VITALS — BP 130/80 | HR 64 | Temp 98.0°F | Ht 62.75 in | Wt 170.0 lb

## 2018-03-05 DIAGNOSIS — N183 Chronic kidney disease, stage 3 unspecified: Secondary | ICD-10-CM

## 2018-03-05 DIAGNOSIS — I10 Essential (primary) hypertension: Secondary | ICD-10-CM

## 2018-03-05 LAB — BASIC METABOLIC PANEL
BUN: 20 mg/dL (ref 6–23)
CHLORIDE: 105 meq/L (ref 96–112)
CO2: 28 mEq/L (ref 19–32)
CREATININE: 1.23 mg/dL — AB (ref 0.40–1.20)
Calcium: 9.5 mg/dL (ref 8.4–10.5)
GFR: 41.36 mL/min — ABNORMAL LOW (ref >60.00)
Glucose, Bld: 126 mg/dL — ABNORMAL HIGH (ref 70–99)
Potassium: 4.1 mEq/L (ref 3.5–5.1)
Sodium: 140 mEq/L (ref 135–145)

## 2018-03-05 NOTE — Assessment & Plan Note (Signed)
Well controlled on losartan 50 mg, continue same. Repeat BMP pending.

## 2018-03-05 NOTE — Assessment & Plan Note (Signed)
BP well controlled on Losartan 50 mg. Repeat BMP pending. Follow up with nephrology as scheduled.

## 2018-03-05 NOTE — Progress Notes (Signed)
Subjective:    Patient ID: [name], female    DOB: February 06, 1930, 85 y.o.   MRN: [id number]  HPI  [name] is an 85 year old female who presents today for follow up of hypertension.  She was last evaluated on 02/19/18 for follow up of hypertension. During that visit she was managed on amlodipine 10 mg given continued decrease in renal function when previously managed on benazepril and hydrochlorothiazide. During her last visit she experienced bilateral ankle edema for which she couldn't tolerate, therefore, amlodipine was discontinued. She was switched to losartan 50 mg and referred to nephrology given continued decrease in renal function.    BP Readings from Last 3 Encounters:  03/05/18 130/80  02/19/18 124/72  01/27/18 134/84   Since her last visit her ankle edema has resolved. She has an appointment with the nephrologist in three weeks. She denies cough, dizziness. She does not check her BP at home.   Review of Systems  Respiratory: Negative for cough and shortness of breath.   Cardiovascular: Negative for chest pain and leg swelling.  Neurological: Negative for dizziness.       Past Medical History:  Diagnosis Date  . Arthritis   . Breast cancer ([place]) 2001   left   . CAD (coronary artery disease)    Asymptomatic  . Diabetes mellitus ([place])   . Diverticulosis   . Hiatal hernia   . History of shingles    ON BACK  . Hyperlipidemia   . Hypertension   . PVC (premature ventricular contraction)   . Vertigo      Social History   Socioeconomic History  . Marital status: Widowed    Spouse name: Not on file  . Number of children: Not on file  . Years of education: Not on file  . Highest education level: Not on file  Occupational History  . Not on file  Social Needs  . Financial resource strain: Not on file  . Food insecurity:    Worry: Not on file    Inability: Not on file  . Transportation needs:    Medical: Not on file    Non-medical: Not on file  Tobacco  Use  . Smoking status: Never Smoker  . Smokeless tobacco: Never Used  Substance and Sexual Activity  . Alcohol use: No  . Drug use: No  . Sexual activity: Not Currently  Lifestyle  . Physical activity:    Days per week: Not on file    Minutes per session: Not on file  . Stress: Not on file  Relationships  . Social connections:    Talks on phone: Not on file    Gets together: Not on file    Attends religious service: Not on file    Active member of club or organization: Not on file    Attends meetings of clubs or organizations: Not on file    Relationship status: Not on file  . Intimate partner violence:    Fear of current or ex partner: Not on file    Emotionally abused: Not on file    Physically abused: Not on file    Forced sexual activity: Not on file  Other Topics Concern  . Not on file  Social History Narrative   Widow.    Lives by herself at home.   Volunteers at [facility].   Enjoys volunteering, helping her neighbors, spending time with family, church.    Past Surgical History:  Procedure Laterality Date  . BREAST  LUMPECTOMY Left 2001  . BREAST LUMPECTOMY WITH AXILLARY LYMPH NODE DISSECTION  2003   left  . CARDIAC CATHETERIZATION  07/13/2009   Recommendation - medical therapy  . CARDIOVASCULAR STRESS TEST  06/16/2009   Mild ischemia demonstrated in the basal inferior, mid inferior, and apical inferior regions.  Marland Kitchen CATARACTS REMOVED    . CHOLECYSTECTOMY    . JOINT REPLACEMENT     BILATERAL TOTAL KNEES  . KNEE ARTHROPLASTY Right 2009  . TOTAL HIP ARTHROPLASTY Left 11/03/2014   Procedure: LEFT TOTAL HIP ARTHROPLASTY ANTERIOR APPROACH;  Surgeon: Gaynelle Arabian, MD;  [facility];  Service: Orthopedics;  Laterality: Left;  . TRANSTHORACIC ECHOCARDIOGRAM  06/16/2009   EF >55%, no significant valvular abnormalities.    Family History  Problem Relation Age of Onset  . Heart attack Mother   . Cancer Mother   . Stroke Father   . Stroke Brother   . Stroke Brother      No Known Allergies  Current Outpatient Medications on File Prior to Visit  Medication Sig Dispense Refill  . aspirin 81 MG tablet Take 81 mg by mouth daily.    Marland Kitchen atorvastatin (LIPITOR) 10 MG tablet TAKE 1 TABLET BY MOUTH ONCE DAILY 90 tablet 1  . Blood Glucose Monitoring Suppl (ONE TOUCH ULTRA 2) w/Device KIT Check blood sugar 1 - 2 times daily and as directed. Dx: E11.9 1 each 0  . glipiZIDE (GLUCOTROL XL) 2.5 MG 24 hr tablet TAKE 1 TABLET BY MOUTH ONCE A DAY WITH BREAKFAST 90 tablet 0  . glucose blood (ONE TOUCH ULTRA TEST) test strip USE AS DIRECTED TO CHECK BLOOD SUGAR 1 TO 2 TIMES DAILY AND AS DIRECTED 100 each 1  . losartan (COZAAR) 50 MG tablet Take 1 tablet (50 mg total) by mouth daily. For blood pressure. 90 tablet 0  . meclizine (ANTIVERT) 25 MG tablet Take 1 tablet (25 mg total) by mouth 2 (two) times daily as needed for dizziness. 30 tablet 1   No current facility-administered medications on file prior to visit.     BP 130/80   Pulse 64   Temp 98 F (36.7 C) (Oral)   Ht 5' 2.75" (1.594 m)   Wt 170 lb (77.1 kg)   SpO2 99%   BMI 30.35 kg/m    Objective:   Physical Exam  Constitutional: She appears well-nourished.  Neck: Neck supple.  Cardiovascular: Normal rate and regular rhythm.  No ankle edema noted  Respiratory: Effort normal and breath sounds normal.  Skin: Skin is warm and dry.           Assessment & Plan:

## 2018-03-05 NOTE — Patient Instructions (Addendum)
Stop by the lab prior to leaving today. I will notify you of your results once received.   Continue losartan 50 mg for blood pressure.   Follow up with the kidney doctor as scheduled.  We will see you in June for your Wellness Visit as scheduled.   It was a pleasure to see you today!

## 2018-03-15 ENCOUNTER — Other Ambulatory Visit: Payer: Self-pay | Admitting: Primary Care

## 2018-03-15 DIAGNOSIS — E119 Type 2 diabetes mellitus without complications: Secondary | ICD-10-CM

## 2018-04-01 ENCOUNTER — Other Ambulatory Visit: Payer: Self-pay | Admitting: Nephrology

## 2018-04-01 DIAGNOSIS — N183 Chronic kidney disease, stage 3 unspecified: Secondary | ICD-10-CM

## 2018-04-08 ENCOUNTER — Ambulatory Visit
Admission: RE | Admit: 2018-04-08 | Discharge: 2018-04-08 | Disposition: A | Discharge status: Home / Self Care | Payer: Medicare Other | Source: Ambulatory Visit | Attending: Nephrology | Admitting: Nephrology

## 2018-04-08 DIAGNOSIS — N183 Chronic kidney disease, stage 3 unspecified: Secondary | ICD-10-CM

## 2018-04-15 ENCOUNTER — Other Ambulatory Visit: Payer: Self-pay | Admitting: Primary Care

## 2018-04-15 DIAGNOSIS — E785 Hyperlipidemia, unspecified: Secondary | ICD-10-CM

## 2018-05-05 ENCOUNTER — Other Ambulatory Visit: Payer: Self-pay | Admitting: Primary Care

## 2018-05-05 DIAGNOSIS — E785 Hyperlipidemia, unspecified: Secondary | ICD-10-CM

## 2018-05-05 DIAGNOSIS — I10 Essential (primary) hypertension: Secondary | ICD-10-CM

## 2018-05-07 ENCOUNTER — Telehealth: Payer: Self-pay | Admitting: *Deleted

## 2018-05-07 NOTE — Telephone Encounter (Signed)
Patient's daughter [name] on [place]) left a voicemail stating that they are keeping her in. [name] wants to know if Allie Bossier NP would recommend anything over the counter to help boost her immune system?

## 2018-05-07 NOTE — Telephone Encounter (Signed)
Nothing specifically other than a daily multivitamin, calcium with vitamin D. Making sure she eats a healthy diet and gets regular activity is important too.

## 2018-05-08 NOTE — Telephone Encounter (Signed)
Message left for patient's daughter to return my call. Per DPR, left detail message of [name] comments for patient on home number.

## 2018-05-10 ENCOUNTER — Other Ambulatory Visit: Payer: Self-pay | Admitting: Primary Care

## 2018-08-04 ENCOUNTER — Other Ambulatory Visit: Payer: Self-pay | Admitting: Primary Care

## 2018-08-04 DIAGNOSIS — I10 Essential (primary) hypertension: Secondary | ICD-10-CM

## 2018-08-04 DIAGNOSIS — E785 Hyperlipidemia, unspecified: Secondary | ICD-10-CM

## 2018-08-04 DIAGNOSIS — E119 Type 2 diabetes mellitus without complications: Secondary | ICD-10-CM

## 2018-08-05 ENCOUNTER — Ambulatory Visit (INDEPENDENT_AMBULATORY_CARE_PROVIDER_SITE_OTHER): Payer: Medicare Other

## 2018-08-05 ENCOUNTER — Other Ambulatory Visit: Payer: Self-pay

## 2018-08-05 ENCOUNTER — Other Ambulatory Visit (INDEPENDENT_AMBULATORY_CARE_PROVIDER_SITE_OTHER): Payer: Medicare Other

## 2018-08-05 DIAGNOSIS — Z0000 Encounter for general adult medical examination without abnormal findings: Secondary | ICD-10-CM

## 2018-08-05 DIAGNOSIS — I10 Essential (primary) hypertension: Secondary | ICD-10-CM

## 2018-08-05 DIAGNOSIS — E119 Type 2 diabetes mellitus without complications: Secondary | ICD-10-CM

## 2018-08-05 DIAGNOSIS — E785 Hyperlipidemia, unspecified: Secondary | ICD-10-CM | POA: Diagnosis not present

## 2018-08-05 LAB — COMPREHENSIVE METABOLIC PANEL
ALT: 29 U/L (ref 0–35)
AST: 25 U/L (ref 0–37)
Albumin: 4 g/dL (ref 3.5–5.2)
Alkaline Phosphatase: 109 U/L (ref 39–117)
BUN: 22 mg/dL (ref 6–23)
CO2: 29 mEq/L (ref 19–32)
Calcium: 9.6 mg/dL (ref 8.4–10.5)
Chloride: 104 mEq/L (ref 96–112)
Creatinine, Ser: 1.21 mg/dL — ABNORMAL HIGH (ref 0.40–1.20)
GFR: 42.11 mL/min — ABNORMAL LOW (ref >60.00)
Glucose, Bld: 106 mg/dL — ABNORMAL HIGH (ref 70–99)
Potassium: 4.5 mEq/L (ref 3.5–5.1)
Sodium: 140 mEq/L (ref 135–145)
Total Bilirubin: 0.7 mg/dL (ref 0.2–1.2)
Total Protein: 6.3 g/dL (ref 6.0–8.3)

## 2018-08-05 LAB — LIPID PANEL
Cholesterol: 142 mg/dL (ref 0–200)
HDL: 47.2 mg/dL (ref >39.00)
LDL Cholesterol: 65 mg/dL (ref 0–99)
NonHDL: 95.18
Total CHOL/HDL Ratio: 3
Triglycerides: 150 mg/dL — ABNORMAL HIGH (ref 0.0–149.0)
VLDL: 30 mg/dL (ref 0.0–40.0)

## 2018-08-05 LAB — HEMOGLOBIN A1C: Hgb A1c MFr Bld: 6.2 % (ref 4.6–6.5)

## 2018-08-05 NOTE — Progress Notes (Signed)
Subjective:   [name] is a 85 y.o. female who presents for Medicare Annual (Subsequent) preventive examination.  Review of Systems:  N/A Cardiac Risk Factors include: advanced age (>3mn, >>33women);diabetes mellitus;dyslipidemia     Objective:     Vitals: There were no vitals taken for this visit.  There is no height or weight on file to calculate BMI.  Advanced Directives 08/05/2018 07/19/2017 07/17/2016 11/03/2014 11/03/2014 10/28/2014  Does Patient Have a Medical Advance Directive? No No Yes Yes Yes Yes  Type of Advance Directive - -Public librarianLiving will HEntiatLiving will H[place]oldsvilleLiving will Living will;Healthcare Power of [place] Chart? - - No - copy requested No - copy requested No - copy requested No - copy requested  Would patient like information on creating a medical advance directive? No - Patient declined Yes (MAU/Ambulatory/Procedural Areas - Information given) - - - -    Tobacco Social History   Tobacco Use  Smoking Status Never Smoker  Smokeless Tobacco Never Used     Counseling given: No   Clinical Intake:  Pre-visit preparation completed: Yes  Pain : No/denies pain Pain Score: 0-No pain     Nutritional Status: BMI 25 -29 Overweight Nutritional Risks: None Diabetes: Yes CBG done?: No Did pt. bring in CBG monitor from home?: No  How often do you need to have someone help you when you read instructions, pamphlets, or other written materials from your doctor or pharmacy?: 1 - Never  Interpreter Needed?: No  Comments: pt is a widow and lives alone Information entered by :: LPinson, RN  Past Medical History:  Diagnosis Date  . Arthritis   . [place]   left   . CAD (coronary artery disease)    Asymptomatic  . Diabetes [place]   . Diverticulosis   . Hiatal hernia   . History of shingles    ON BACK  .  Hyperlipidemia   . Hypertension   . PVC (premature ventricular contraction)   . Vertigo    Past Surgical History:  Procedure Laterality Date  . BREAST LUMPECTOMY Left 2001  . BREAST LUMPECTOMY WITH AXILLARY LYMPH NODE DISSECTION  2003   left  . CARDIAC CATHETERIZATION  07/13/2009   Recommendation - medical therapy  . CARDIOVASCULAR STRESS TEST  06/16/2009   Mild ischemia demonstrated in the basal inferior, mid inferior, and apical inferior regions.  .Marland KitchenCATARACTS REMOVED    . CHOLECYSTECTOMY    . JOINT REPLACEMENT     BILATERAL TOTAL KNEES  . KNEE ARTHROPLASTY Right 2009  . TOTAL HIP ARTHROPLASTY Left 11/03/2014   Procedure: LEFT TOTAL HIP ARTHROPLASTY ANTERIOR APPROACH;  Surgeon: FGaynelle Arabian MD;  [facility];  Service: Orthopedics;  Laterality: Left;  . TRANSTHORACIC ECHOCARDIOGRAM  06/16/2009   EF >55%, no significant valvular abnormalities.   Family History  Problem Relation Age of Onset  . Heart attack Mother   . Cancer Mother   . Stroke Father   . Stroke Brother   . Stroke Brother    Social History   Socioeconomic History  . Marital status: Widowed    Spouse name: Not on file  . Number of children: Not on file  . Years of education: Not on file  . Highest education level: Not on file  Occupational History  . Not on file  Social Needs  . Financial resource strain: Not on file  .  Food insecurity    Worry: Not on file    Inability: Not on file  . Transportation needs    Medical: Not on file    Non-medical: Not on file  Tobacco Use  . Smoking status: Never Smoker  . Smokeless tobacco: Never Used  Substance and Sexual Activity  . Alcohol use: No  . Drug use: No  . Sexual activity: Not Currently  Lifestyle  . Physical activity    Days per week: Not on file    Minutes per session: Not on file  . Stress: Not on file  Relationships  . Social Herbalist on phone: Not on file    Gets together: Not on file    Attends religious service: Not on file     Active member of club or organization: Not on file    Attends meetings of clubs or organizations: Not on file    Relationship status: Not on file  Other Topics Concern  . Not on file  Social History Narrative   Widow.    Lives by herself at home.   Volunteers at [facility].   Enjoys volunteering, helping her neighbors, spending time with family, church.    Outpatient Encounter Medications as of 08/05/2018  Medication Sig  . aspirin 81 MG tablet Take 81 mg by mouth daily.  Marland Kitchen atorvastatin (LIPITOR) 10 MG tablet TAKE 1 TABLET BY MOUTH ONCE DAILY  . Blood Glucose Monitoring Suppl (ONE TOUCH ULTRA 2) w/Device KIT Check blood sugar 1 - 2 times daily and as directed. Dx: E11.9  . glipiZIDE (GLUCOTROL XL) 2.5 MG 24 hr tablet TAKE 1 TABLET BY MOUTH ONCE A DAY WITH BREAKFAST  . glucose blood (ONE TOUCH ULTRA TEST) test strip USE AS DIRECTED TO CHECK BLOOD SUGAR 1 TO 2 TIMES DAILY AND AS DIRECTED  . losartan (COZAAR) 50 MG tablet TAKE 1 TABLET BY MOUTH ONCE A DAY FOR BLOOD PRESSURE  . meclizine (ANTIVERT) 25 MG tablet Take 1 tablet (25 mg total) by mouth 2 (two) times daily as needed for dizziness.   No facility-administered encounter medications on file as of 08/05/2018.     Activities of Daily Living In your present state of health, do you have any difficulty performing the following activities: 08/05/2018  Hearing? N  Vision? N  Difficulty concentrating or making decisions? N  Walking or climbing stairs? N  Dressing or bathing? N  Doing errands, shopping? N  Preparing Food and eating ? N  Using the Toilet? N  In the past six months, have you accidently leaked urine? N  Do you have problems with loss of bowel control? N  Managing your Medications? N  Managing your Finances? N  Housekeeping or managing your Housekeeping? N  Some recent data might be hidden    Patient Care Team: Pleas Koch, NP as PCP - General (Internal Medicine)    Assessment:   This is a routine wellness  examination for [name].   Hearing Screening   _0  _1  _2  _3  _4  _5  _6  _7  _8   Right ear:           Left ear:           Vision Screening Comments: Vision exam in 2019 with Dr. Gloriann Loan   Exercise Activities and Dietary recommendations Current Exercise Habits: Home exercise routine, Type of exercise: walking, Time (Minutes): 20, Frequency (Times/Week): 7, Weekly Exercise (Minutes/Week): 140, Intensity: Mild, Exercise limited by: None identified  Goals    . DIET -  INCREASE WATER INTAKE     Starting 08/05/2018, I will continue to drink at least 6-8 glasses of water daily.        Fall Risk Fall Risk  08/05/2018 07/19/2017 07/17/2016 06/30/2015  Falls in the past year? 0 No No No   Depression Screen PHQ 2/9 Scores 08/05/2018 07/19/2017 07/17/2016 06/30/2015  PHQ - 2 Score 0 0 0 0  PHQ- 9 Score 0 0 - -     Cognitive Function MMSE - Mini Mental State Exam 08/05/2018 07/19/2017 07/17/2016  Orientation to time _0 Orientation to Place _1 Registration _2 Attention/ Calculation 0 0 0  Recall _3 Recall-comments unable to recall 1 of 3 words - -  Language- name 2 objects 0 0 0  Language- repeat _4 Language- follow 3 step command 0 3 3  Language- read & follow direction 0 0 0  Write a sentence 0 0 0  Copy design 0 0 0  Total score _5 PLEASE NOTE: A Mini-Cog screen was completed. Maximum score is 17. A value of 0 denotes this part of [place]stein MMSE was not completed or the patient failed this part of the Mini-Cog screening.   Mini-Cog Screening Orientation to Time - Max 5 pts Orientation to Place - Max 5 pts Registration - Max 3 pts Recall - Max 3 pts Language Repeat - Max 1 pts      Immunization History  Administered Date(s) Administered  . Influenza, High Dose Seasonal PF 11/27/2017  . Influenza-Unspecified 10/22/2016  . Pneumococcal Conjugate-13 06/30/2015  . Pneumococcal Polysaccharide-23 07/17/2016    Screening Tests Health  Maintenance  Topic Date Due  . TETANUS/TDAP  07/18/2026 (Originally 10/11/1948)  . INFLUENZA VACCINE  09/13/2018  . OPHTHALMOLOGY EXAM  09/13/2018  . FOOT EXAM  01/28/2019  . HEMOGLOBIN A1C  02/04/2019  . DEXA SCAN  Completed  . PNA vac Low Risk Adult  Completed      Plan:    I have personally reviewed, addressed, and noted the following in the patient's chart:  A. Medical and social history B. Use of alcohol, tobacco or illicit drugs  C. Current medications and supplements D. Functional ability and status E.  Nutritional status F.  Physical activity G. Advance directives H. List of other physicians I.  Hospitalizations, surgeries, and ER visits in previous 12 months J.  Vitals (unless it is a telemedicine encounter) K. Screenings to include cognitive, depression, hearing, vision (NOTE: hearing and vision screenings not completed in telemedicine encounter) L. Referrals and appointments   In addition, I have reviewed and discussed with patient certain preventive protocols, quality metrics, and best practice recommendations. A written personalized care plan for preventive services and recommendations were provided to patient.  With patient's permission, we connected on 08/05/18 at 10:00 AM EDT. Interactive audio and video telecommunications were attempted with patient. This attempt was unsuccessful due to patient having technical difficulties OR patient did not have access to video capability.  Encounter was completed with audio only.  Two patient identifiers were used to ensure the encounter occurred with the correct person. Patient was in home and writer was in office.     Signed,   Lindell Noe, MHA, BS, RN Health Coach

## 2018-08-05 NOTE — Patient Instructions (Signed)
Ms. Brackin , Thank you for taking time to come for your Medicare Wellness Visit. I appreciate your ongoing commitment to your health goals. Please review the following plan we discussed and let me know if I can assist you in the future.   These are the goals we discussed: Goals    . DIET - INCREASE WATER INTAKE     Starting 08/05/2018, I will continue to drink at least 6-8 glasses of water daily.        This is a list of the screening recommended for you and due dates:  Health Maintenance  Topic Date Due  . Tetanus Vaccine  07/18/2026*  . Flu Shot  09/13/2018  . Eye exam for diabetics  09/13/2018  . Complete foot exam   01/28/2019  . Hemoglobin A1C  02/04/2019  . DEXA scan (bone density measurement)  Completed  . Pneumonia vaccines  Completed  *Topic was postponed. The date shown is not the original due date.   Preventive Care for Adults  A healthy lifestyle and preventive care can promote health and wellness. Preventive health guidelines for adults include the following key practices.  . A routine yearly physical is a good way to check with your health care provider about your health and preventive screening. It is a chance to share any concerns and updates on your health and to receive a thorough exam.  . Visit your dentist for a routine exam and preventive care every 6 months. Brush your teeth twice a day and floss once a day. Good oral hygiene prevents tooth decay and gum disease.  . The frequency of eye exams is based on your age, health, family medical history, use  of contact lenses, and other factors. Follow your health care provider's recommendations for frequency of eye exams.  . Eat a healthy diet. Foods like vegetables, fruits, whole grains, low-fat dairy products, and lean protein foods contain the nutrients you need without too many calories. Decrease your intake of foods high in solid fats, added sugars, and salt. Eat the right amount of calories for you. Get  information about a proper diet from your health care provider, if necessary.  . Regular physical exercise is one of the most important things you can do for your health. Most adults should get at least 150 minutes of moderate-intensity exercise (any activity that increases your heart rate and causes you to sweat) each week. In addition, most adults need muscle-strengthening exercises on 2 or more days a week.  Silver Sneakers may be a benefit available to you. To determine eligibility, you may visit the website: www.silversneakers.com or contact program at 908-291-4099 Mon-Fri between 8AM-8PM.   . Maintain a healthy weight. The body mass index (BMI) is a screening tool to identify possible weight problems. It provides an estimate of body fat based on height and weight. Your health care provider can find your BMI and can help you achieve or maintain a healthy weight.   For adults 20 years and older: ? A BMI below 18.5 is considered underweight. ? A BMI of 18.5 to 24.9 is normal. ? A BMI of 25 to 29.9 is considered overweight. ? A BMI of 30 and above is considered obese.   . Maintain normal blood lipids and cholesterol levels by exercising and minimizing your intake of saturated fat. Eat a balanced diet with plenty of fruit and vegetables. Blood tests for lipids and cholesterol should begin at age 17 and be repeated every 5 years. If your lipid  or cholesterol levels are high, you are over 50, or you are at high risk for heart disease, you may need your cholesterol levels checked more frequently. Ongoing high lipid and cholesterol levels should be treated with medicines if diet and exercise are not working.  . If you smoke, find out from your health care provider how to quit. If you do not use tobacco, please do not start.  . If you choose to drink alcohol, please do not consume more than 2 drinks per day. One drink is considered to be 12 ounces (355 mL) of beer, 5 ounces (148 mL) of wine, or 1.5  ounces (44 mL) of liquor.  . If you are 32-46 years old, ask your health care provider if you should take aspirin to prevent strokes.  . Use sunscreen. Apply sunscreen liberally and repeatedly throughout the day. You should seek shade when your shadow is shorter than you. Protect yourself by wearing long sleeves, pants, a wide-brimmed hat, and sunglasses year round, whenever you are outdoors.  . Once a month, do a whole body skin exam, using a mirror to look at the skin on your back. Tell your health care provider of new moles, moles that have irregular borders, moles that are larger than a pencil eraser, or moles that have changed in shape or color.

## 2018-08-05 NOTE — Progress Notes (Signed)
I reviewed health advisor's note, was available for consultation, and agree with documentation and plan.  

## 2018-08-05 NOTE — Progress Notes (Signed)
PCP notes:   Health maintenance:  A1C - completed  Abnormal screenings:   Mini-Cog score: 16/17 MMSE - Mini Mental State Exam 08/05/2018 07/19/2017 07/17/2016  Orientation to time 5 5 5   Orientation to Place 5 5 5   Registration 3 3 3   Attention/ Calculation 0 0 0  Recall 2 3 3   Recall-comments unable to recall 1 of 3 words - -  Language- name 2 objects 0 0 0  Language- repeat 1 1 1   Language- follow 3 step command 0 3 3  Language- read & follow direction 0 0 0  Write a sentence 0 0 0  Copy design 0 0 0  Total score 16 20 20      Patient concerns:   None  Nurse concerns:  None  Next PCP appt:   08/08/18 @ 0900

## 2018-08-08 ENCOUNTER — Other Ambulatory Visit: Payer: Self-pay

## 2018-08-08 ENCOUNTER — Ambulatory Visit: Payer: Medicare Other

## 2018-08-08 ENCOUNTER — Ambulatory Visit (INDEPENDENT_AMBULATORY_CARE_PROVIDER_SITE_OTHER): Payer: Medicare Other | Admitting: Primary Care

## 2018-08-08 ENCOUNTER — Encounter: Payer: Self-pay | Admitting: Primary Care

## 2018-08-08 VITALS — BP 124/76 | HR 63 | Temp 97.8°F | Ht 62.75 in | Wt 169.0 lb

## 2018-08-08 DIAGNOSIS — I10 Essential (primary) hypertension: Secondary | ICD-10-CM

## 2018-08-08 DIAGNOSIS — E2839 Other primary ovarian failure: Secondary | ICD-10-CM

## 2018-08-08 DIAGNOSIS — Z23 Encounter for immunization: Secondary | ICD-10-CM | POA: Diagnosis not present

## 2018-08-08 DIAGNOSIS — Z0000 Encounter for general adult medical examination without abnormal findings: Secondary | ICD-10-CM

## 2018-08-08 DIAGNOSIS — E785 Hyperlipidemia, unspecified: Secondary | ICD-10-CM

## 2018-08-08 DIAGNOSIS — I2510 Atherosclerotic heart disease of native coronary artery without angina pectoris: Secondary | ICD-10-CM

## 2018-08-08 DIAGNOSIS — N183 Chronic kidney disease, stage 3 unspecified: Secondary | ICD-10-CM

## 2018-08-08 DIAGNOSIS — E119 Type 2 diabetes mellitus without complications: Secondary | ICD-10-CM

## 2018-08-08 MED ORDER — LOSARTAN POTASSIUM 50 MG PO TABS: ORAL_TABLET | ORAL | 3 refills | Status: DC

## 2018-08-08 MED ORDER — ZOSTER VAC RECOMB ADJUVANTED 50 MCG/0.5ML IM SUSR: 0.5000 mL | Freq: Once | INTRAMUSCULAR | 1 refills | Status: AC

## 2018-08-08 NOTE — Assessment & Plan Note (Signed)
Very well controlled based off of recent A1C results. Discussed the option of stopping Glipizide and she would like to continue.   Discussed to notify me if she sees readings consistently below 90. Managed on statin and ARB. Pneumonia vaccination UTD. Foot exam UTD.  Follow up in 6 months.

## 2018-08-08 NOTE — Assessment & Plan Note (Signed)
Immunizations UTD except for Shingles. Rx for Shingrix provided. Mammogram UTD. Declines colon cancer screening given age, this is appropriate.  Bone density scan due, order placed.  Exam overall unremarkable. Labs reviewed. Follow up in 1 year for CPE

## 2018-08-08 NOTE — Assessment & Plan Note (Signed)
Well controlled with statin therapy, continue same.

## 2018-08-08 NOTE — Patient Instructions (Addendum)
Take the shingles vaccination to your pharmacy for administration.  Continue taking 12000 mg of calcium and 800 units of vitamin D for bones.  Complete your bone density test as discussed.  Conitnue exercising. You should be getting 150 minutes of exercise weekly.  Continue to work on a healthy diet.  Please schedule a follow up appointment in 6 months for diabetes check.   It was a pleasure to see you today!    Preventive Care 83 Years and Older, Female Preventive care refers to lifestyle choices and visits with your health care provider that can promote health and wellness. What does preventive care include?  A yearly physical exam. This is also called an annual well check.  Dental exams once or twice a year.  Routine eye exams. Ask your health care provider how often you should have your eyes checked.  Personal lifestyle choices, including: ? Daily care of your teeth and gums. ? Regular physical activity. ? Eating a healthy diet. ? Avoiding tobacco and drug use. ? Limiting alcohol use. ? Practicing safe sex. ? Taking low-dose aspirin every day. ? Taking vitamin and mineral supplements as recommended by your health care provider. What happens during an annual well check? The services and screenings done by your health care provider during your annual well check will depend on your age, overall health, lifestyle risk factors, and family history of disease. Counseling Your health care provider may ask you questions about your:  Alcohol use.  Tobacco use.  Drug use.  Emotional well-being.  Home and relationship well-being.  Sexual activity.  Eating habits.  History of falls.  Memory and ability to understand (cognition).  Work and work Statistician.  Reproductive health.  Screening You may have the following tests or measurements:  Height, weight, and BMI.  Blood pressure.  Lipid and cholesterol levels. These may be checked every 5 years, or more  frequently if you are over 51 years old.  Skin check.  Lung cancer screening. You may have this screening every year starting at age 83 if you have a 30-pack-year history of smoking and currently smoke or have quit within the past 15 years.  Colorectal cancer screening. All adults should have this screening starting at age 80 and continuing until age 89. You will have tests every 1-10 years, depending on your results and the type of screening test. People at increased risk should start screening at an earlier age. Screening tests may include: ? Guaiac-based fecal occult blood testing. ? Fecal immunochemical test (FIT). ? Stool DNA test. ? Virtual colonoscopy. ? Sigmoidoscopy. During this test, a flexible tube with a tiny camera (sigmoidoscope) is used to examine your rectum and lower colon. The sigmoidoscope is inserted through your anus into your rectum and lower colon. ? Colonoscopy. During this test, a long, thin, flexible tube with a tiny camera (colonoscope) is used to examine your entire colon and rectum.  Hepatitis C blood test.  Hepatitis B blood test.  Sexually transmitted disease (STD) testing.  Diabetes screening. This is done by checking your blood sugar (glucose) after you have not eaten for a while (fasting). You may have this done every 1-3 years.  Bone density scan. This is done to screen for osteoporosis. You may have this done starting at age 18.  Mammogram. This may be done every 1-2 years. Talk to your health care provider about how often you should have regular mammograms. Talk with your health care provider about your test results, treatment options, and if  necessary, the need for more tests. Vaccines Your health care provider may recommend certain vaccines, such as:  Influenza vaccine. This is recommended every year.  Tetanus, diphtheria, and acellular pertussis (Tdap, Td) vaccine. You may need a Td booster every 10 years.  Varicella vaccine. You may need this  if you have not been vaccinated.  Zoster vaccine. You may need this after age 54.  Measles, mumps, and rubella (MMR) vaccine. You may need at least one dose of MMR if you were born in 1957 or later. You may also need a second dose.  Pneumococcal 13-valent conjugate (PCV13) vaccine. One dose is recommended after age 62.  Pneumococcal polysaccharide (PPSV23) vaccine. One dose is recommended after age 44.  Meningococcal vaccine. You may need this if you have certain conditions.  Hepatitis A vaccine. You may need this if you have certain conditions or if you travel or work in places where you may be exposed to hepatitis A.  Hepatitis B vaccine. You may need this if you have certain conditions or if you travel or work in places where you may be exposed to hepatitis B.  Haemophilus influenzae type b (Hib) vaccine. You may need this if you have certain conditions. Talk to your health care provider about which screenings and vaccines you need and how often you need them. This information is not intended to replace advice given to you by your health care provider. Make sure you discuss any questions you have with your health care provider. Document Released: 02/25/2015 Document Revised: 03/21/2017 Document Reviewed: 11/30/2014 Elsevier Interactive Patient Education  2019 [employer].

## 2018-08-08 NOTE — Assessment & Plan Note (Signed)
Stable in the office today on losartan 50 mg, continue same. BMP stable.

## 2018-08-08 NOTE — Assessment & Plan Note (Signed)
Compliant to statin therapy. LDL at goal, BP well controlled. Asymptomatic.

## 2018-08-08 NOTE — Progress Notes (Signed)
Subjective:    Patient ID: [name], female    DOB: [birth year], 85 y.o.   MRN: [id number]  HPI  [name] is an 85 year old female who presents today for complete physical.  She is checking her blood glucose once daily in the morning, fasting which is running low 100's on average.   Highest reading: 120 Lowest reading: 99  Immunizations: -Influenza: Due this season  -Pneumonia: Completed in 2017 and 2018 -Shingles: Never completed   Diet: She endorses a fair diet. She eats a lot chicken, vegetables, starch. Occasionally desserts. She is drinking water, sugar free Sprite.  Exercise: She is walking several times weekly  Eye exam: Completed in 2019 Dental exam: No recent exam Colonoscopy: Declines given age 3: Completed in 2020 Dexa: Completed last in 2017, osteopenia. She is taking calcium and vitamin D.  BP Readings from Last 3 Encounters:  08/08/18 124/76  03/05/18 130/80  02/19/18 124/72     Review of Systems  Constitutional: Negative for unexpected weight change.  HENT: Negative for rhinorrhea.   Respiratory: Negative for cough and shortness of breath.   Cardiovascular: Negative for chest pain.  Gastrointestinal: Negative for constipation and diarrhea.  Genitourinary: Negative for difficulty urinating and menstrual problem.  Musculoskeletal: Negative for arthralgias and myalgias.       Acute left elbow discomfort, intermittent.  Skin: Negative for color change and rash.  Allergic/Immunologic: Negative for environmental allergies.  Neurological: Negative for dizziness, numbness and headaches.  Psychiatric/Behavioral:       Some stress from Covid, overall able to manage       Past Medical History:  Diagnosis Date  . Arthritis   . Breast cancer ([place]) 2001   left   . CAD (coronary artery disease)    Asymptomatic  . Diabetes mellitus ([place])   . Diverticulosis   . Hiatal hernia   . History of shingles    ON BACK  . Hyperlipidemia   .  Hypertension   . PVC (premature ventricular contraction)   . Vertigo      Social History   Socioeconomic History  . Marital status: Widowed    Spouse name: Not on file  . Number of children: Not on file  . Years of education: Not on file  . Highest education level: Not on file  Occupational History  . Not on file  Social Needs  . Financial resource strain: Not on file  . Food insecurity    Worry: Not on file    Inability: Not on file  . Transportation needs    Medical: Not on file    Non-medical: Not on file  Tobacco Use  . Smoking status: Never Smoker  . Smokeless tobacco: Never Used  Substance and Sexual Activity  . Alcohol use: No  . Drug use: No  . Sexual activity: Not Currently  Lifestyle  . Physical activity    Days per week: Not on file    Minutes per session: Not on file  . Stress: Not on file  Relationships  . Social Herbalist on phone: Not on file    Gets together: Not on file    Attends religious service: Not on file    Active member of club or organization: Not on file    Attends meetings of clubs or organizations: Not on file    Relationship status: Not on file  . Intimate partner violence    Fear of current or ex partner: Not on  file    Emotionally abused: Not on file    Physically abused: Not on file    Forced sexual activity: Not on file  Other Topics Concern  . Not on file  Social History Narrative   Widow.    Lives by herself at home.   Volunteers at [facility].   Enjoys volunteering, helping her neighbors, spending time with family, church.    Past Surgical History:  Procedure Laterality Date  . BREAST LUMPECTOMY Left 2001  . BREAST LUMPECTOMY WITH AXILLARY LYMPH NODE DISSECTION  2003   left  . CARDIAC CATHETERIZATION  07/13/2009   Recommendation - medical therapy  . CARDIOVASCULAR STRESS TEST  06/16/2009   Mild ischemia demonstrated in the basal inferior, mid inferior, and apical inferior regions.  Marland Kitchen CATARACTS REMOVED    .  CHOLECYSTECTOMY    . JOINT REPLACEMENT     BILATERAL TOTAL KNEES  . KNEE ARTHROPLASTY Right 2009  . TOTAL HIP ARTHROPLASTY Left 11/03/2014   Procedure: LEFT TOTAL HIP ARTHROPLASTY ANTERIOR APPROACH;  Surgeon: Gaynelle Arabian, MD;  Location: [facility];  Service: Orthopedics;  Laterality: Left;  . TRANSTHORACIC ECHOCARDIOGRAM  06/16/2009   EF >55%, no significant valvular abnormalities.    Family History  Problem Relation Age of Onset  . Heart attack Mother   . Cancer Mother   . Stroke Father   . Stroke Brother   . Stroke Brother     No Known Allergies  Current Outpatient Medications on File Prior to Visit  Medication Sig Dispense Refill  . aspirin 81 MG tablet Take 81 mg by mouth daily.    Marland Kitchen atorvastatin (LIPITOR) 10 MG tablet TAKE 1 TABLET BY MOUTH ONCE DAILY 90 tablet 1  . Blood Glucose Monitoring Suppl (ONE TOUCH ULTRA 2) w/Device KIT Check blood sugar 1 - 2 times daily and as directed. Dx: E11.9 1 each 0  . glipiZIDE (GLUCOTROL XL) 2.5 MG 24 hr tablet TAKE 1 TABLET BY MOUTH ONCE A DAY WITH BREAKFAST 90 tablet 1  . glucose blood (ONE TOUCH ULTRA TEST) test strip USE AS DIRECTED TO CHECK BLOOD SUGAR 1 TO 2 TIMES DAILY AND AS DIRECTED 100 each 2  . meclizine (ANTIVERT) 25 MG tablet Take 1 tablet (25 mg total) by mouth 2 (two) times daily as needed for dizziness. (Patient not taking: Reported on 08/08/2018) 30 tablet 1   No current facility-administered medications on file prior to visit.     BP 124/76   Pulse 63   Temp 97.8 F (36.6 C) (Tympanic)   Ht 5' 2.75" (1.594 m)   Wt 169 lb (76.7 kg)   SpO2 98%   BMI 30.18 kg/m    Objective:   Physical Exam  Constitutional: She is oriented to person, place, and time. She appears well-nourished.  HENT:  Mouth/Throat: No oropharyngeal exudate.  Eyes: Pupils are equal, round, and reactive to light. EOM are normal.  Neck: Neck supple. No thyromegaly present.  Cardiovascular: Normal rate and regular rhythm.  Respiratory: Effort normal  and breath sounds normal.  GI: Soft. Bowel sounds are normal. There is no abdominal tenderness.  Musculoskeletal: Normal range of motion.  Neurological: She is alert and oriented to person, place, and time.  Skin: Skin is warm and dry.  Psychiatric: She has a normal mood and affect.           Assessment & Plan:

## 2018-08-08 NOTE — Assessment & Plan Note (Signed)
Stable from recent BMP, following with nephrology. Continue ARB.

## 2018-09-18 ENCOUNTER — Ambulatory Visit
Admission: RE | Admit: 2018-09-18 | Discharge: 2018-09-18 | Disposition: A | Discharge status: Home / Self Care | Payer: Medicare Other | Source: Ambulatory Visit | Attending: Primary Care | Admitting: Primary Care

## 2018-09-18 DIAGNOSIS — E2839 Other primary ovarian failure: Secondary | ICD-10-CM | POA: Diagnosis not present

## 2018-09-24 ENCOUNTER — Encounter: Payer: Self-pay | Admitting: *Deleted

## 2018-10-17 ENCOUNTER — Other Ambulatory Visit: Payer: Self-pay | Admitting: Primary Care

## 2018-10-17 DIAGNOSIS — E119 Type 2 diabetes mellitus without complications: Secondary | ICD-10-CM

## 2018-11-24 ENCOUNTER — Telehealth: Payer: Self-pay

## 2018-11-24 ENCOUNTER — Other Ambulatory Visit: Payer: Self-pay

## 2018-11-24 ENCOUNTER — Ambulatory Visit (INDEPENDENT_AMBULATORY_CARE_PROVIDER_SITE_OTHER): Payer: Medicare Other | Admitting: Primary Care

## 2018-11-24 ENCOUNTER — Encounter: Payer: Self-pay | Admitting: Primary Care

## 2018-11-24 VITALS — BP 130/84 | HR 65 | Temp 97.9°F | Ht 62.75 in | Wt 166.8 lb

## 2018-11-24 DIAGNOSIS — N1832 Chronic kidney disease, stage 3b: Secondary | ICD-10-CM | POA: Diagnosis not present

## 2018-11-24 DIAGNOSIS — R450 Nervousness: Secondary | ICD-10-CM

## 2018-11-24 DIAGNOSIS — E119 Type 2 diabetes mellitus without complications: Secondary | ICD-10-CM | POA: Diagnosis not present

## 2018-11-24 DIAGNOSIS — I10 Essential (primary) hypertension: Secondary | ICD-10-CM | POA: Diagnosis not present

## 2018-11-24 DIAGNOSIS — K5900 Constipation, unspecified: Secondary | ICD-10-CM

## 2018-11-24 DIAGNOSIS — F4323 Adjustment disorder with mixed anxiety and depressed mood: Secondary | ICD-10-CM | POA: Insufficient documentation

## 2018-11-24 NOTE — Assessment & Plan Note (Signed)
Stopped Glipizide after last check. Repeat A1C pending.

## 2018-11-24 NOTE — Assessment & Plan Note (Signed)
Suspect this is more anxiety/nerve related but will rule out other causes. Labs pending including CBC, TSH, BMP, A1C.   Encouraged to start connecting with more friends on the phone to avoid isolation. Also recommended to plan out her day to keep busy. Offered therapy for which she kindly declines.

## 2018-11-24 NOTE — Telephone Encounter (Signed)
[name] (DPR signed) starting 11/23/18 pt has felt jittery inside. No CP,SOB,H/A or dizziness; FBS today was 114 and pt ate breakfast;pt has had small constipated BM; last normal BM was 2-3 days ago. Pt has no covid symptoms, no travel and no known exposure to + covid. Pt wants to be seen at [facility]; pt scheduled appt with Gentry Fitz NP today at 2:20 pm. UC & ED precautions given and [name] voiced understanding. Otherwise pt will see Anda Kraft today at 2:20.

## 2018-11-24 NOTE — Assessment & Plan Note (Signed)
Stable in the office today, continue current regimen. 

## 2018-11-24 NOTE — Assessment & Plan Note (Signed)
Evaluated by nephrologist in August 2020, no changes made. Repeat BMP pending.

## 2018-11-24 NOTE — Patient Instructions (Signed)
Stop by the lab prior to leaving today. I will notify you of your results once received.   For constipation you can try Metamucil or docusate sodium (Colase). Avoid laxatives due to kidney function and risk for dehydration.  Drink 4-5 bottles of water daily.  Try to get some exercise daily.  Eat plenty of fiber in your diet.  It was a pleasure to see you today!   Constipation, Adult Constipation is when a person:  Poops (has a bowel movement) fewer times in a week than normal.  Has a hard time pooping.  Has poop that is dry, hard, or bigger than normal. Follow these instructions at home: Eating and drinking   Eat foods that have a lot of fiber, such as: ? Fresh fruits and vegetables. ? Whole grains. ? Beans.  Eat less of foods that are high in fat, low in fiber, or overly processed, such as: ? [place] fries. ? Hamburgers. ? Cookies. ? Candy. ? Soda.  Drink enough fluid to keep your pee (urine) clear or pale yellow. General instructions  Exercise regularly or as told by your doctor.  Go to the restroom when you feel like you need to poop. Do not hold it in.  Take over-the-counter and prescription medicines only as told by your doctor. These include any fiber supplements.  Do pelvic floor retraining exercises, such as: ? Doing deep breathing while relaxing your lower belly (abdomen). ? Relaxing your pelvic floor while pooping.  Watch your condition for any changes.  Keep all follow-up visits as told by your doctor. This is important. Contact a doctor if:  You have pain that gets worse.  You have a fever.  You have not pooped for 4 days.  You throw up (vomit).  You are not hungry.  You lose weight.  You are bleeding from the anus.  You have thin, pencil-like poop (stool). Get help right away if:  You have a fever, and your symptoms suddenly get worse.  You leak poop or have blood in your poop.  Your belly feels hard or bigger than normal (is  bloated).  You have very bad belly pain.  You feel dizzy or you faint. This information is not intended to replace advice given to you by your health care provider. Make sure you discuss any questions you have with your health care provider. Document Released: 07/18/2007 Document Revised: 01/11/2017 Document Reviewed: 07/20/2015 Elsevier Patient Education  2020 [employer].

## 2018-11-24 NOTE — Progress Notes (Signed)
Subjective:    Patient ID: [name], female    DOB: 06/05/1929, 85 y.o.   MRN: [id number]  HPI  [name] is an 85 year old female with a history of CAD, hypertension, Type 2 diabetes, CKD III, hyperlipidemia, dizziness who presents today with a chief complaint of feeling "jittery".  Her daughter called this morning notifying us that the patient endorsed feeling "jittery inside" since yesterday. Also with some constipation with small bowel movement this morning. She was asked to come in for evaluation.  Today she endorses feeling nervous/anxious for the last 2-3 days, some intermittent anxiety with depression since Covid-19 pandemic. She denies feeling weak, dizzy, faint. She thinks that her symptoms are secondary to being stuck in her home since Covid-19 pandemic. She talks with her family on the phone numerous times daily.   She also endorses constipation with sensation of having to have a movement but will have firm small movements or balls. She denies bloody stools. She has taken a few doses of Miralax infrequently with some improvement. Stopped taking Glipizide about one month ago.  BP Readings from Last 3 Encounters:  11/24/18 130/84  08/08/18 124/76  03/05/18 130/80     Review of Systems  Constitutional: Negative for fever.  Eyes: Negative for visual disturbance.  Gastrointestinal: Positive for constipation. Negative for abdominal pain, blood in stool, diarrhea, nausea and vomiting.  Neurological: Negative for dizziness and headaches.  Psychiatric/Behavioral:       See HPI       Past Medical History:  Diagnosis Date  . Arthritis   . Breast cancer ([place]) 2001   left   . CAD (coronary artery disease)    Asymptomatic  . Diabetes mellitus ([place])   . Diverticulosis   . Hiatal hernia   . History of shingles    ON BACK  . Hyperlipidemia   . Hypertension   . PVC (premature ventricular contraction)   . Vertigo      Social History   Socioeconomic History  .  Marital status: Widowed    Spouse name: Not on file  . Number of children: Not on file  . Years of education: Not on file  . Highest education level: Not on file  Occupational History  . Not on file  Social Needs  . Financial resource strain: Not on file  . Food insecurity    Worry: Not on file    Inability: Not on file  . Transportation needs    Medical: Not on file    Non-medical: Not on file  Tobacco Use  . Smoking status: Never Smoker  . Smokeless tobacco: Never Used  Substance and Sexual Activity  . Alcohol use: No  . Drug use: No  . Sexual activity: Not Currently  Lifestyle  . Physical activity    Days per week: Not on file    Minutes per session: Not on file  . Stress: Not on file  Relationships  . Social Herbalist on phone: Not on file    Gets together: Not on file    Attends religious service: Not on file    Active member of club or organization: Not on file    Attends meetings of clubs or organizations: Not on file    Relationship status: Not on file  . Intimate partner violence    Fear of current or ex partner: Not on file    Emotionally abused: Not on file    Physically abused: Not on  file    Forced sexual activity: Not on file  Other Topics Concern  . Not on file  Social History Narrative   Widow.    Lives by herself at home.   Volunteers at [facility].   Enjoys volunteering, helping her neighbors, spending time with family, church.    Past Surgical History:  Procedure Laterality Date  . BREAST LUMPECTOMY Left 2001  . BREAST LUMPECTOMY WITH AXILLARY LYMPH NODE DISSECTION  2003   left  . CARDIAC CATHETERIZATION  07/13/2009   Recommendation - medical therapy  . CARDIOVASCULAR STRESS TEST  06/16/2009   Mild ischemia demonstrated in the basal inferior, mid inferior, and apical inferior regions.  Marland Kitchen CATARACTS REMOVED    . CHOLECYSTECTOMY    . JOINT REPLACEMENT     BILATERAL TOTAL KNEES  . KNEE ARTHROPLASTY Right 2009  . TOTAL HIP ARTHROPLASTY  Left 11/03/2014   Procedure: LEFT TOTAL HIP ARTHROPLASTY ANTERIOR APPROACH;  Surgeon: Gaynelle Arabian, MD;  Location: [facility];  Service: Orthopedics;  Laterality: Left;  . TRANSTHORACIC ECHOCARDIOGRAM  06/16/2009   EF >55%, no significant valvular abnormalities.    Family History  Problem Relation Age of Onset  . Heart attack Mother   . Cancer Mother   . Stroke Father   . Stroke Brother   . Stroke Brother     No Known Allergies  Current Outpatient Medications on File Prior to Visit  Medication Sig Dispense Refill  . aspirin 81 MG tablet Take 81 mg by mouth daily.    Marland Kitchen atorvastatin (LIPITOR) 10 MG tablet TAKE 1 TABLET BY MOUTH ONCE DAILY 90 tablet 1  . Blood Glucose Monitoring Suppl (ONE TOUCH ULTRA 2) w/Device KIT Check blood sugar 1 - 2 times daily and as directed. Dx: E11.9 1 each 0  . losartan (COZAAR) 50 MG tablet TAKE 1 TABLET BY MOUTH ONCE A DAY FOR BLOOD PRESSURE 90 tablet 3  . ONETOUCH ULTRA test strip USE AS DIRECTED TO CHECK BLOOD SUGARS ONE TO TWO TIMES DAILY AS DIRECTED 100 each 2  . meclizine (ANTIVERT) 25 MG tablet Take 1 tablet (25 mg total) by mouth 2 (two) times daily as needed for dizziness. (Patient not taking: Reported on 11/24/2018) 30 tablet 1   No current facility-administered medications on file prior to visit.     BP 130/84   Pulse 65   Temp 97.9 F (36.6 C) (Temporal)   Ht 5' 2.75" (1.594 m)   Wt 166 lb 12 oz (75.6 kg)   SpO2 98%   BMI 29.77 kg/m    Objective:   Physical Exam  Constitutional: She appears well-nourished.  Neck: Neck supple.  Cardiovascular: Normal rate and regular rhythm.  Respiratory: Effort normal and breath sounds normal.  Skin: Skin is warm and dry.  Psychiatric: She has a normal mood and affect.           Assessment & Plan:

## 2018-11-24 NOTE — Assessment & Plan Note (Signed)
Encouraged to increase fiber, water, exercise in lifestyle.  Discussed use of Metamucil or Colace, avoid laxatives.

## 2018-11-24 NOTE — Telephone Encounter (Signed)
Noted, will evaluate. 

## 2018-11-25 LAB — BASIC METABOLIC PANEL
BUN: 18 mg/dL (ref 6–23)
CO2: 27 mEq/L (ref 19–32)
Calcium: 9.9 mg/dL (ref 8.4–10.5)
Chloride: 105 mEq/L (ref 96–112)
Creatinine, Ser: 1.3 mg/dL — ABNORMAL HIGH (ref 0.40–1.20)
GFR: 38.74 mL/min — ABNORMAL LOW (ref >60.00)
Glucose, Bld: 139 mg/dL — ABNORMAL HIGH (ref 70–99)
Potassium: 4.1 mEq/L (ref 3.5–5.1)
Sodium: 142 mEq/L (ref 135–145)

## 2018-11-25 LAB — CBC
HCT: 40.8 % (ref 36.0–46.0)
Hemoglobin: 13.5 g/dL (ref 12.0–15.0)
MCHC: 33 g/dL (ref 30.0–36.0)
MCV: 94.6 fl (ref 78.0–100.0)
Platelets: 268 10*3/uL (ref 150.0–400.0)
RBC: 4.32 Mil/uL (ref 3.87–5.11)
RDW: 13.1 % (ref 11.5–15.5)
WBC: 7.5 10*3/uL (ref 4.0–10.5)

## 2018-11-25 LAB — TSH: TSH: 1.75 u[IU]/mL (ref 0.35–4.50)

## 2018-11-25 LAB — HEMOGLOBIN A1C: Hgb A1c MFr Bld: 6.4 % (ref 4.6–6.5)

## 2018-12-14 ENCOUNTER — Emergency Department
Admission: EM | Admit: 2018-12-14 | Discharge: 2018-12-14 | Disposition: A | Discharge status: Home / Self Care | Payer: Medicare Other | Attending: Student | Admitting: Student

## 2018-12-14 ENCOUNTER — Encounter: Payer: Self-pay | Admitting: Emergency Medicine

## 2018-12-14 ENCOUNTER — Emergency Department: Payer: Medicare Other

## 2018-12-14 ENCOUNTER — Other Ambulatory Visit: Payer: Self-pay

## 2018-12-14 DIAGNOSIS — R1032 Left lower quadrant pain: Secondary | ICD-10-CM | POA: Diagnosis present

## 2018-12-14 DIAGNOSIS — Z7982 Long term (current) use of aspirin: Secondary | ICD-10-CM | POA: Insufficient documentation

## 2018-12-14 DIAGNOSIS — Z79899 Other long term (current) drug therapy: Secondary | ICD-10-CM | POA: Insufficient documentation

## 2018-12-14 DIAGNOSIS — I10 Essential (primary) hypertension: Secondary | ICD-10-CM | POA: Diagnosis not present

## 2018-12-14 DIAGNOSIS — R197 Diarrhea, unspecified: Secondary | ICD-10-CM | POA: Diagnosis not present

## 2018-12-14 DIAGNOSIS — N200 Calculus of kidney: Secondary | ICD-10-CM

## 2018-12-14 DIAGNOSIS — E119 Type 2 diabetes mellitus without complications: Secondary | ICD-10-CM | POA: Diagnosis not present

## 2018-12-14 LAB — URINALYSIS, COMPLETE (UACMP) WITH MICROSCOPIC
Bilirubin Urine: NEGATIVE
Glucose, UA: NEGATIVE mg/dL
Ketones, ur: 5 mg/dL — AB
Leukocytes,Ua: NEGATIVE
Nitrite: NEGATIVE
Protein, ur: NEGATIVE mg/dL
Specific Gravity, Urine: 1.018 (ref 1.005–1.030)
pH: 5 (ref 5.0–8.0)

## 2018-12-14 LAB — CBC
HCT: 42 % (ref 36.0–46.0)
Hemoglobin: 13.7 g/dL (ref 12.0–15.0)
MCH: 30.9 pg (ref 26.0–34.0)
MCHC: 32.6 g/dL (ref 30.0–36.0)
MCV: 94.8 fL (ref 80.0–100.0)
Platelets: 228 10*3/uL (ref 150–400)
RBC: 4.43 MIL/uL (ref 3.87–5.11)
RDW: 12.9 % (ref 11.5–15.5)
WBC: 9.2 10*3/uL (ref 4.0–10.5)
nRBC: 0 % (ref 0.0–0.2)

## 2018-12-14 LAB — COMPREHENSIVE METABOLIC PANEL
ALT: 24 U/L (ref 0–44)
AST: 28 U/L (ref 15–41)
Albumin: 4.4 g/dL (ref 3.5–5.0)
Alkaline Phosphatase: 83 U/L (ref 38–126)
Anion gap: 14 (ref 5–15)
BUN: 25 mg/dL — ABNORMAL HIGH (ref 8–23)
CO2: 22 mmol/L (ref 22–32)
Calcium: 9.6 mg/dL (ref 8.9–10.3)
Chloride: 103 mmol/L (ref 98–111)
Creatinine, Ser: 1.52 mg/dL — ABNORMAL HIGH (ref 0.44–1.00)
GFR calc Af Amer: 35 mL/min — ABNORMAL LOW (ref >60)
GFR calc non Af Amer: 31 mL/min — ABNORMAL LOW (ref >60)
Glucose, Bld: 175 mg/dL — ABNORMAL HIGH (ref 70–99)
Potassium: 4.2 mmol/L (ref 3.5–5.1)
Sodium: 139 mmol/L (ref 135–145)
Total Bilirubin: 1.3 mg/dL — ABNORMAL HIGH (ref 0.3–1.2)
Total Protein: 7 g/dL (ref 6.5–8.1)

## 2018-12-14 LAB — LIPASE, BLOOD: Lipase: 27 U/L (ref 11–51)

## 2018-12-14 MED ORDER — IOHEXOL 300 MG/ML  SOLN
75.0000 mL | Freq: Once | INTRAMUSCULAR | Status: AC | PRN
  Administered 2018-12-14: 75 mL via INTRAVENOUS

## 2018-12-14 MED ORDER — OXYCODONE HCL 5 MG PO TABS: 5.0000 mg | ORAL_TABLET | Freq: Four times a day (QID) | ORAL | 0 refills | Status: AC | PRN

## 2018-12-14 MED ORDER — OXYCODONE HCL 5 MG PO TABS
5.0000 mg | ORAL_TABLET | Freq: Once | ORAL | Status: AC
  Administered 2018-12-14: 5 mg via ORAL
  Filled 2018-12-14: qty 1

## 2018-12-14 MED ORDER — SODIUM CHLORIDE 0.9% FLUSH: 3.0000 mL | Freq: Once | INTRAVENOUS | Status: DC

## 2018-12-14 MED ORDER — SODIUM CHLORIDE 0.9 % IV BOLUS
1000.0000 mL | Freq: Once | INTRAVENOUS | Status: AC
  Administered 2018-12-14: 1000 mL via INTRAVENOUS

## 2018-12-14 MED ORDER — ONDANSETRON HCL 4 MG PO TABS: 4.0000 mg | ORAL_TABLET | Freq: Three times a day (TID) | ORAL | 0 refills | Status: DC | PRN

## 2018-12-14 MED ORDER — FENTANYL CITRATE (PF) 100 MCG/2ML IJ SOLN
25.0000 ug | Freq: Once | INTRAMUSCULAR | Status: AC
  Administered 2018-12-14: 25 ug via INTRAVENOUS
  Filled 2018-12-14: qty 2

## 2018-12-14 MED ORDER — ONDANSETRON 4 MG PO TBDP
4.0000 mg | ORAL_TABLET | Freq: Once | ORAL | Status: AC | PRN
  Administered 2018-12-14: 4 mg via ORAL
  Filled 2018-12-14: qty 1

## 2018-12-14 NOTE — ED Triage Notes (Signed)
Pt to ED via POV, pt states that she is having left lower quadrant pain since waking up this morning. Pt states that she has also been having diarrhea since waking up this morning. Pt denies N/V, fever or chills. Pt states that she has been urinating a lot but that this is normal for her.

## 2018-12-14 NOTE — ED Notes (Signed)
EDP at bedside  

## 2018-12-14 NOTE — ED Notes (Signed)
Pt provided crackers to eat. Has cup of water to drink.

## 2018-12-14 NOTE — ED Provider Notes (Signed)
[facility] Emergency Department Provider Note  ____________________________________________   First MD Initiated Contact with Patient 12/14/18 1458     (approximate)  I have reviewed the triage vital signs and the nursing notes.  History  Chief Complaint Abdominal Pain and Diarrhea    HPI [name] is a 85 y.o. female with history of breast cancer, CAD, DM, diverticulosis, HTN, CKD who presents to the ED for abdominal pain and diarrhea. Pain is located in the LLQ, with some mild radiation to the flank/back area.  Pain started this AM, moderate in severity, sharp, and constant.  Pain is worsened with palpation.  She also reports associated diarrhea since this AM as well, at least 4-5 episodes, nonbloody. She denies any fevers, chills, or vomiting. She reports urinary frequency, but states this is normal for her.  She denies any hematuria or history of nephrolithiasis.  On chart review, she does have a history of diverticulitis.   Past Medical Hx Past Medical History:  Diagnosis Date  . Arthritis   . Breast cancer ([place]) 2001   left   . CAD (coronary artery disease)    Asymptomatic  . Diabetes mellitus ([place])   . Diverticulosis   . Hiatal hernia   . History of shingles    ON BACK  . Hyperlipidemia   . Hypertension   . PVC (premature ventricular contraction)   . Vertigo     Problem List Patient Active Problem List   Diagnosis Date Noted  . Jittery feeling 11/24/2018  . Constipation 11/24/2018  . Preventative health care 08/08/2018  . Stage 3 chronic kidney disease 02/19/2018  . Medicare annual wellness visit, subsequent 06/30/2015  . OA (osteoarthritis) of hip 11/03/2014  . Ventricular bigeminy 12/16/2012  . CAD (coronary artery disease) 12/16/2012  . HTN (hypertension) 12/16/2012  . Hyperlipidemia 12/16/2012  . DM2 (diabetes mellitus, type 2) ([place]) 12/16/2012  . Dizziness of unknown cause 12/16/2012    Past Surgical Hx Past  Surgical History:  Procedure Laterality Date  . BREAST LUMPECTOMY Left 2001  . BREAST LUMPECTOMY WITH AXILLARY LYMPH NODE DISSECTION  2003   left  . CARDIAC CATHETERIZATION  07/13/2009   Recommendation - medical therapy  . CARDIOVASCULAR STRESS TEST  06/16/2009   Mild ischemia demonstrated in the basal inferior, mid inferior, and apical inferior regions.  Marland Kitchen CATARACTS REMOVED    . CHOLECYSTECTOMY    . JOINT REPLACEMENT     BILATERAL TOTAL KNEES  . KNEE ARTHROPLASTY Right 2009  . TOTAL HIP ARTHROPLASTY Left 11/03/2014   Procedure: LEFT TOTAL HIP ARTHROPLASTY ANTERIOR APPROACH;  Surgeon: Gaynelle Arabian, MD;  Location: [facility];  Service: Orthopedics;  Laterality: Left;  . TRANSTHORACIC ECHOCARDIOGRAM  06/16/2009   EF >55%, no significant valvular abnormalities.    Medications Prior to Admission medications   Medication Sig Start Date End Date Taking? Authorizing Provider  aspirin 81 MG tablet Take 81 mg by mouth daily.    [provider]  atorvastatin (LIPITOR) 10 MG tablet TAKE 1 TABLET BY MOUTH ONCE DAILY 05/05/18   Pleas Koch, NP  Blood Glucose Monitoring Suppl (ONE TOUCH ULTRA 2) w/Device KIT Check blood sugar 1 - 2 times daily and as directed. Dx: E11.9 05/08/16   Pleas Koch, NP  losartan (COZAAR) 50 MG tablet TAKE 1 TABLET BY MOUTH ONCE A DAY FOR BLOOD PRESSURE 08/08/18   Pleas Koch, NP  meclizine (ANTIVERT) 25 MG tablet Take 1 tablet (25 mg total) by mouth 2 (two)  times daily as needed for dizziness. Patient not taking: Reported on 11/24/2018 01/24/16   Pleas Koch, NP  [facility] ULTRA test strip USE AS DIRECTED TO CHECK BLOOD SUGARS ONE TO TWO TIMES DAILY AS DIRECTED 10/17/18   Pleas Koch, NP    Allergies Patient has no known allergies.  Family Hx Family History  Problem Relation Age of Onset  . Heart attack Mother   . Cancer Mother   . Stroke Father   . Stroke Brother   . Stroke Brother     Social Hx Social History   Tobacco Use   . Smoking status: Never Smoker  . Smokeless tobacco: Never Used  Substance Use Topics  . Alcohol use: No  . Drug use: No     Review of Systems  Constitutional: Negative for fever, chills. Eyes: Negative for visual changes. ENT: Negative for sore throat. Cardiovascular: Negative for chest pain. Respiratory: Negative for shortness of breath. Gastrointestinal: + abdominal pain, diarrhea Genitourinary: Negative for dysuria. Musculoskeletal: Negative for leg swelling. Skin: Negative for rash. Neurological: Negative for for headaches.   Physical Exam  Vital Signs: ED Triage Vitals  Enc Vitals Group     BP 12/14/18 1040 (!) 219/63     Pulse Rate 12/14/18 1040 85     Resp 12/14/18 1423 16     Temp 12/14/18 1040 98.8 F (37.1 C)     Temp Source 12/14/18 1040 Oral     SpO2 12/14/18 1040 99 %     Weight 12/14/18 1041 165 lb (74.8 kg)     Height 12/14/18 1041 5' 4"  (1.626 m)     Head Circumference --      Peak Flow --      Pain Score 12/14/18 1047 7     Pain Loc --      Pain Edu? --      Excl. in [place]? --     Constitutional: Alert and oriented.  Head: Normocephalic. Atraumatic. Eyes: Conjunctivae clear. Sclera anicteric. Nose: No congestion. No rhinorrhea. Mouth/Throat: Mucous membranes are moist.  Neck: No stridor.   Cardiovascular: Normal rate, regular rhythm. Extremities well perfused. Respiratory: Normal respiratory effort.  Lungs CTAB. Gastrointestinal: Soft.  TTP in the left lower quadrant, no rebound or guarding.  Remainder of abdomen soft nontender. Musculoskeletal: No lower extremity edema. No deformities. Neurologic:  Normal speech and language. No gross focal neurologic deficits are appreciated.  Skin: Skin is warm, dry and intact. No rash noted. Psychiatric: Mood and affect are appropriate for situation.  EKG  N/A   Radiology  CT: IMPRESSION:  Mild left hydroureteronephrosis and perinephric stranding due to 3  mm distal left ureteral calculus near  the UVJ.   Colonic diverticulosis, without radiographic evidence of  diverticulitis.   Small hiatal hernia.   Aortic Atherosclerosis (ICD10-I70.0).    Procedures  Procedure(s) performed (including critical care):  Procedures   Initial Impression / Assessment and Plan / ED Course  85 y.o. female who presents to the ED for left lower quadrant abdominal pain, diarrhea, nausea.  Ddx: diverticulitis, colitis, nephrolithiasis, pyelonephritis  Plan: labs, fluids, imaging  Imaging reveals a left-sided, 3 mm stone near the UVJ.  No evidence of diverticulitis.  Labs otherwise reveal creatinine close to baseline, 1.52.  Received IVF.  No leukocytosis.  No evidence of convincing infection on urinalysis, sent for culture in the setting of her stone.  After fluids and oral and IV pain medication, patient feeling symptomatically improved, has tolerated by mouth in the  emergency department.  As such, will plan for discharge with a strainer, referral to urology, and prescription for Zofran and oxycodone as needed.  Advised close follow-up with PCP as well and discussed return precautions.  Patient voices understanding and is comfortable to plan and discharge.  Final Clinical Impression(s) / ED Diagnosis  Final diagnoses:  Diarrhea, unspecified type  LLQ abdominal pain  Nephrolithiasis       Note:  This document was prepared using Dragon voice recognition software and may include unintentional dictation errors.   Lilia Pro., MD 12/14/18 [id number]

## 2018-12-14 NOTE — ED Notes (Signed)
Pt ambulatory to toilet and back.

## 2018-12-14 NOTE — ED Notes (Signed)
Pt hallway bed, unable to sign

## 2018-12-14 NOTE — ED Notes (Signed)
Pt taken to CT.

## 2018-12-14 NOTE — Discharge Instructions (Addendum)
Thank you for letting us take care of you in the emergency department today.   Please continue to take any regular, prescribed medications.   New medications we have prescribed:  - Zofran - take as needed for nausea - Oxycodone - take as needed for pain you cannot control with tylenol or other over the counter medications  Please use the strainer we provided when urinating in an attempt to catch your kidney stone.  Please follow up with: - The urology doctor to review your ER visit and discuss management of your kidney stone in case you do not pass it by yourself - Your primary care doctor to review your ER visit and follow up on your symptoms.   Please return to the ER for any new or worsening symptoms.

## 2018-12-15 LAB — URINE CULTURE

## 2018-12-17 ENCOUNTER — Ambulatory Visit (INDEPENDENT_AMBULATORY_CARE_PROVIDER_SITE_OTHER): Payer: Medicare Other | Admitting: Primary Care

## 2018-12-17 ENCOUNTER — Encounter: Payer: Self-pay | Admitting: Primary Care

## 2018-12-17 ENCOUNTER — Other Ambulatory Visit: Payer: Self-pay

## 2018-12-17 DIAGNOSIS — N200 Calculus of kidney: Secondary | ICD-10-CM | POA: Diagnosis not present

## 2018-12-17 DIAGNOSIS — R450 Nervousness: Secondary | ICD-10-CM

## 2018-12-17 MED ORDER — SERTRALINE HCL 25 MG PO TABS: 25.0000 mg | ORAL_TABLET | Freq: Every day | ORAL | 1 refills | Status: DC

## 2018-12-17 NOTE — Assessment & Plan Note (Signed)
Left sided 3 mm stone at UVJ per CT scan in late October 2020. Symptoms resolved. Discussed to hold off on Urology consultation unless symptoms returned.   Continue to use strainer although she may have already passed stone as symptoms have resolved. No alarm signs today.   Continue water hydration. She will update if anything changes.

## 2018-12-17 NOTE — Progress Notes (Signed)
Subjective:    Patient ID: [name], female    DOB: [birth year], 85 y.o.   MRN: [id number]  HPI  [name] is a 85 year old female with a history of CAD, hypertension, type 2 diabetes, osteoarthritis, CKD who presents today for emergency department follow up.  She presented to [facility] ED on 12/14/18 with a chief complaint of abdominal pain and diarrhea. Her pain was located to the LLQ with radiation to flank/back. Also with 4-5 episodes of diarrhea and urinary frequency.   During her stay in the ED she underwent CT abdomen/pelvis which revealed a 3 mm left sided stone near the UVJ with mild hydroureteronephrosis. Labs with stable CKD, no leukocytosis. She was treated with IV fluids and pain mediation with improvement. She was discharged home last that evening with prescriptions for Zofran and oxycodone and told to follow up with PCP and Urology. She was provided with a urine strainer.   Since her ED visit she's doing much better. Her pain, diarrhea, and nausea has resolved, she's not taking any Zofran or oxycodone. She's not followed up with Urology as symptoms have resolved. She denies diarrhea, flank pain, hematuria. She is working on water intake.   She endorses chronic anxiety and feeling down/depressed since Covid-19. She mentions feeling "jittery" at times, feels like it's becoming more frequent. Her family is with her today who agrees. GAD 7 score of 8 and PHQ 9 score of 11 today. She is interested in treatment.    Review of Systems  Constitutional: Negative for fever.  Gastrointestinal: Negative for abdominal pain, diarrhea and vomiting.  Genitourinary: Negative for dysuria, frequency and hematuria.       Past Medical History:  Diagnosis Date  . Arthritis   . Breast cancer ([place]) 2001   left   . CAD (coronary artery disease)    Asymptomatic  . Diabetes mellitus ([place])   . Diverticulosis   . Hiatal hernia   . History of shingles    ON BACK  . Hyperlipidemia   .  Hypertension   . Kidney stone   . PVC (premature ventricular contraction)   . Vertigo      Social History   Socioeconomic History  . Marital status: Widowed    Spouse name: Not on file  . Number of children: Not on file  . Years of education: Not on file  . Highest education level: Not on file  Occupational History  . Not on file  Social Needs  . Financial resource strain: Not on file  . Food insecurity    Worry: Not on file    Inability: Not on file  . Transportation needs    Medical: Not on file    Non-medical: Not on file  Tobacco Use  . Smoking status: Never Smoker  . Smokeless tobacco: Never Used  Substance and Sexual Activity  . Alcohol use: No  . Drug use: No  . Sexual activity: Not Currently  Lifestyle  . Physical activity    Days per week: Not on file    Minutes per session: Not on file  . Stress: Not on file  Relationships  . Social Herbalist on phone: Not on file    Gets together: Not on file    Attends religious service: Not on file    Active member of club or organization: Not on file    Attends meetings of clubs or organizations: Not on file    Relationship status: Not on  file  . Intimate partner violence    Fear of current or ex partner: Not on file    Emotionally abused: Not on file    Physically abused: Not on file    Forced sexual activity: Not on file  Other Topics Concern  . Not on file  Social History Narrative   Widow.    Lives by herself at home.   Volunteers at [employer].   Enjoys volunteering, helping her neighbors, spending time with family, church.    Past Surgical History:  Procedure Laterality Date  . BREAST LUMPECTOMY Left 2001  . BREAST LUMPECTOMY WITH AXILLARY LYMPH NODE DISSECTION  2003   left  . CARDIAC CATHETERIZATION  07/13/2009   Recommendation - medical therapy  . CARDIOVASCULAR STRESS TEST  06/16/2009   Mild ischemia demonstrated in the basal inferior, mid inferior, and apical inferior regions.  Marland Kitchen CATARACTS  REMOVED    . CHOLECYSTECTOMY    . JOINT REPLACEMENT     BILATERAL TOTAL KNEES  . KNEE ARTHROPLASTY Right 2009  . TOTAL HIP ARTHROPLASTY Left 11/03/2014   Procedure: LEFT TOTAL HIP ARTHROPLASTY ANTERIOR APPROACH;  Surgeon: Gaynelle Arabian, MD;  Location: [facility];  Service: Orthopedics;  Laterality: Left;  . TRANSTHORACIC ECHOCARDIOGRAM  06/16/2009   EF >55%, no significant valvular abnormalities.    Family History  Problem Relation Age of Onset  . Heart attack Mother   . Cancer Mother   . Stroke Father   . Stroke Brother   . Stroke Brother     No Known Allergies  Current Outpatient Medications on File Prior to Visit  Medication Sig Dispense Refill  . aspirin 81 MG tablet Take 81 mg by mouth daily.    Marland Kitchen atorvastatin (LIPITOR) 10 MG tablet TAKE 1 TABLET BY MOUTH ONCE DAILY 90 tablet 1  . Blood Glucose Monitoring Suppl (ONE TOUCH ULTRA 2) w/Device KIT Check blood sugar 1 - 2 times daily and as directed. Dx: E11.9 1 each 0  . losartan (COZAAR) 50 MG tablet TAKE 1 TABLET BY MOUTH ONCE A DAY FOR BLOOD PRESSURE 90 tablet 3  . ONETOUCH ULTRA test strip USE AS DIRECTED TO CHECK BLOOD SUGARS ONE TO TWO TIMES DAILY AS DIRECTED 100 each 2  . oxyCODONE (ROXICODONE) 5 MG immediate release tablet Take 1 tablet (5 mg total) by mouth every 6 (six) hours as needed for up to 5 days for moderate pain or severe pain. 20 tablet 0  . meclizine (ANTIVERT) 25 MG tablet Take 1 tablet (25 mg total) by mouth 2 (two) times daily as needed for dizziness. (Patient not taking: Reported on 12/17/2018) 30 tablet 1   No current facility-administered medications on file prior to visit.     BP 124/86   Pulse 78   Temp (!) 97.2 F (36.2 C) (Temporal)   Ht 5' 2.75" (1.594 m)   Wt 164 lb 8 oz (74.6 kg)   SpO2 98%   BMI 29.37 kg/m    Objective:   Physical Exam  Constitutional: She appears well-nourished.  Neck: Neck supple.  Cardiovascular: Normal rate and regular rhythm.  Respiratory: Effort normal and breath  sounds normal.  GI: Soft. Normal appearance. There is no abdominal tenderness. There is no CVA tenderness.  Skin: Skin is warm and dry.  Psychiatric: She has a normal mood and affect.           Assessment & Plan:

## 2018-12-17 NOTE — Assessment & Plan Note (Signed)
Continued. GAD 7 score of 8 and PHQ 9 score of 11. Discussed options for treatment with both she and her family, they opt for medication.  Rx for Zoloft 25 mg sent to pharmacy. Patient is to take 1/2 tablet daily for 6 days, then advance to 1 full tablet thereafter. We discussed possible side effects of headache, GI upset, drowsiness, and SI/HI. If thoughts of SI/HI develop, we discussed to present to the emergency immediately. Patient verbalized understanding.   Follow up in 6 weeks for re-evaluation.

## 2018-12-17 NOTE — Patient Instructions (Signed)
Start sertraline (Zoloft) 25 mg tablets every morning for anxiety and depression. Start with 1/2 tablet daily for 6 days, then increase to 1 full tablet thereafter.  Ensure you are consuming 64 ounces of water daily.  Please notify me if your symptoms return.   Schedule a follow up visit for 6 weeks for evaluation of anxiety/depression.  It was a pleasure to see you today!

## 2019-02-02 ENCOUNTER — Other Ambulatory Visit: Payer: Self-pay

## 2019-02-02 ENCOUNTER — Ambulatory Visit (INDEPENDENT_AMBULATORY_CARE_PROVIDER_SITE_OTHER): Payer: Medicare Other | Admitting: Primary Care

## 2019-02-02 DIAGNOSIS — F4323 Adjustment disorder with mixed anxiety and depressed mood: Secondary | ICD-10-CM

## 2019-02-02 DIAGNOSIS — E785 Hyperlipidemia, unspecified: Secondary | ICD-10-CM

## 2019-02-02 DIAGNOSIS — R450 Nervousness: Secondary | ICD-10-CM | POA: Diagnosis not present

## 2019-02-02 MED ORDER — ATORVASTATIN CALCIUM 10 MG PO TABS: 10.0000 mg | ORAL_TABLET | Freq: Every evening | ORAL | 3 refills | Status: DC

## 2019-02-02 MED ORDER — SERTRALINE HCL 25 MG PO TABS: 25.0000 mg | ORAL_TABLET | Freq: Every day | ORAL | 3 refills | Status: DC

## 2019-02-02 NOTE — Progress Notes (Signed)
Subjective:    Patient ID: [name], female    DOB: [birth year], 85 y.o.   MRN: [id number]  HPI  Ms. Zobrist is a 85 year old female with a history of CAD, hypertension, type 2 diabetes, CKD stage III, hyperlipidemia who presents today for for follow up of anxiety and depression. She is also needing refills of her atorvastatin.  She was last evaluated on 12/17/18 with symptoms of feeling jittery, anxious/nervous. She lives at home by herself and has little interaction with anyone during the day. GAD 7 score of 8 and PHQ 9 score of 11 that day so we initiated Zoloft 25 mg.  Since her last visit she's doing better. Positive effects include reduction in "jittery feeling", feeling less anxious/nervous, less fatigue. She denies GI upset, Si/HI, nausea. She would like to continue Zoloft.   Review of Systems  Respiratory: Negative for shortness of breath.   Cardiovascular: Negative for chest pain.  Psychiatric/Behavioral: Negative for sleep disturbance and suicidal ideas.       See HPI       Past Medical History:  Diagnosis Date  . Arthritis   . Breast cancer ([place]) 2001   left   . CAD (coronary artery disease)    Asymptomatic  . Diabetes mellitus ([place])   . Diverticulosis   . Hiatal hernia   . History of shingles    ON BACK  . Hyperlipidemia   . Hypertension   . Kidney stone   . PVC (premature ventricular contraction)   . Vertigo      Social History   Socioeconomic History  . Marital status: Widowed    Spouse name: Not on file  . Number of children: Not on file  . Years of education: Not on file  . Highest education level: Not on file  Occupational History  . Not on file  Tobacco Use  . Smoking status: Never Smoker  . Smokeless tobacco: Never Used  Substance and Sexual Activity  . Alcohol use: No  . Drug use: No  . Sexual activity: Not Currently  Other Topics Concern  . Not on file  Social History Narrative   Widow.    Lives by herself at home.   Volunteers  at [facility] [employer].   Enjoys volunteering, helping her neighbors, spending time with family, church.   Social Determinants of Health   Financial Resource Strain:   . Difficulty of Paying Living Expenses: Not on file  Food Insecurity:   . Worried About Charity fundraiser in the Last Year: Not on file  . Ran Out of Food in the Last Year: Not on file  Transportation Needs:   . Lack of Transportation (Medical): Not on file  . Lack of Transportation (Non-Medical): Not on file  Physical Activity:   . Days of Exercise per Week: Not on file  . Minutes of Exercise per Session: Not on file  Stress:   . Feeling of Stress : Not on file  Social Connections:   . Frequency of Communication with Friends and Family: Not on file  . Frequency of Social Gatherings with Friends and Family: Not on file  . Attends Religious Services: Not on file  . Active Member of Clubs or Organizations: Not on file  . Attends Archivist Meetings: Not on file  . Marital Status: Not on file  Intimate Partner Violence:   . Fear of Current or Ex-Partner: Not on file  . Emotionally Abused: Not on file  . Physically  Abused: Not on file  . Sexually Abused: Not on file    Past Surgical History:  Procedure Laterality Date  . BREAST LUMPECTOMY Left 2001  . BREAST LUMPECTOMY WITH AXILLARY LYMPH NODE DISSECTION  2003   left  . CARDIAC CATHETERIZATION  07/13/2009   Recommendation - medical therapy  . CARDIOVASCULAR STRESS TEST  06/16/2009   Mild ischemia demonstrated in the basal inferior, mid inferior, and apical inferior regions.  Marland Kitchen CATARACTS REMOVED    . CHOLECYSTECTOMY    . JOINT REPLACEMENT     BILATERAL TOTAL KNEES  . KNEE ARTHROPLASTY Right 2009  . TOTAL HIP ARTHROPLASTY Left 11/03/2014   Procedure: LEFT TOTAL HIP ARTHROPLASTY ANTERIOR APPROACH;  Surgeon: Gaynelle Arabian, MD;  Location: [facility];  Service: Orthopedics;  Laterality: Left;  . TRANSTHORACIC ECHOCARDIOGRAM  06/16/2009   EF >55%, no significant valvular  abnormalities.    Family History  Problem Relation Age of Onset  . Heart attack Mother   . Cancer Mother   . Stroke Father   . Stroke Brother   . Stroke Brother     No Known Allergies  Current Outpatient Medications on File Prior to Visit  Medication Sig Dispense Refill  . aspirin 81 MG tablet Take 81 mg by mouth daily.    Marland Kitchen atorvastatin (LIPITOR) 10 MG tablet TAKE 1 TABLET BY MOUTH ONCE DAILY 90 tablet 1  . Blood Glucose Monitoring Suppl (ONE TOUCH ULTRA 2) w/Device KIT Check blood sugar 1 - 2 times daily and as directed. Dx: E11.9 1 each 0  . losartan (COZAAR) 50 MG tablet TAKE 1 TABLET BY MOUTH ONCE A DAY FOR BLOOD PRESSURE 90 tablet 3  . ONETOUCH ULTRA test strip USE AS DIRECTED TO CHECK BLOOD SUGARS ONE TO TWO TIMES DAILY AS DIRECTED 100 each 2  . sertraline (ZOLOFT) 25 MG tablet Take 1 tablet (25 mg total) by mouth daily. For anxiety and depression 30 tablet 1  . meclizine (ANTIVERT) 25 MG tablet Take 1 tablet (25 mg total) by mouth 2 (two) times daily as needed for dizziness. (Patient not taking: Reported on 12/17/2018) 30 tablet 1   No current facility-administered medications on file prior to visit.    BP 124/76   Pulse 80   Temp (!) 97.1 F (36.2 C) (Temporal)   Ht 5' 2.75" (1.594 m)   Wt 160 lb 4 oz (72.7 kg)   SpO2 98%   BMI 28.61 kg/m    Objective:   Physical Exam  Constitutional: She appears well-nourished.  Cardiovascular: Normal rate and regular rhythm.  Respiratory: Effort normal and breath sounds normal.  Musculoskeletal:     Cervical back: Neck supple.  Skin: Skin is warm and dry.  Psychiatric: She has a normal mood and affect.           Assessment & Plan:

## 2019-02-02 NOTE — Patient Instructions (Addendum)
Continue taking sertraline (Zoloft) 25 mg for anxiety and depression.  Please schedule a physical with me for 6 months. You may also schedule a lab only appointment 3-4 days prior. We will discuss your lab results in detail during your physical.  It was a pleasure to see you today!

## 2019-02-02 NOTE — Assessment & Plan Note (Signed)
Improved with Zoloft 25 mg, seems to be at goal. No side effects. Refills sent to pharmacy. Continue to monitor.

## 2019-02-04 ENCOUNTER — Telehealth: Payer: Self-pay

## 2019-02-04 NOTE — Telephone Encounter (Signed)
[name], patient's daughter, called stating that patient saw Alma Friendly on 02/02/2019 and was told for constipation to take stool softener. She took 2 today but has not had a bowel movement still, no bowel movement in 2 days at least. Patient feels the pressure to go but straining. No abdominal pain or any other pain, no vomiting or nausea. What can she do at this time?

## 2019-02-04 NOTE — Telephone Encounter (Signed)
Please have patient add in Miralax that can be purchased OTC. One capful of powder needs to be mixed with 8 ounces of water daily x 1 week. Have her update next week.

## 2019-02-05 NOTE — Telephone Encounter (Signed)
Spoken and notified patient's daughter of Kate Clark's comments. Patient's daughter verbalized understanding.  

## 2019-02-17 ENCOUNTER — Telehealth: Payer: Self-pay

## 2019-02-17 NOTE — Telephone Encounter (Signed)
Spoken and notified patient's daughter of Kate Clark's comments. Patient's daughter verbalized understanding.  

## 2019-02-17 NOTE — Telephone Encounter (Signed)
Cindy(DPR signed) left v/m wanting to know if Gentry Fitz NP thought ok to get a covid vaccine.Cindy request cb.

## 2019-02-17 NOTE — Telephone Encounter (Signed)
Please notify [name] that I believe Covid vaccination would be a good idea. Please also notify her that we do not have vaccinations in our clinic.

## 2019-03-06 ENCOUNTER — Other Ambulatory Visit: Payer: Self-pay | Admitting: Primary Care

## 2019-03-06 DIAGNOSIS — Z1231 Encounter for screening mammogram for malignant neoplasm of breast: Secondary | ICD-10-CM

## 2019-04-02 ENCOUNTER — Other Ambulatory Visit: Payer: Self-pay | Admitting: Primary Care

## 2019-04-03 ENCOUNTER — Telehealth: Payer: Self-pay

## 2019-04-03 MED ORDER — ACCU-CHEK AVIVA PLUS W/DEVICE KIT: PACK | 0 refills | Status: DC

## 2019-04-03 MED ORDER — ACCU-CHEK AVIVA PLUS VI STRP: ORAL_STRIP | 3 refills | Status: DC

## 2019-04-03 MED ORDER — ACCU-CHEK FASTCLIX LANCETS MISC: 3 refills | Status: DC

## 2019-04-03 NOTE — Telephone Encounter (Signed)
[facility] called ins covers accuchek aviva plus meter, strips and fast clix lancets. Sent rx to [facility]. Last seen 02/02/19.

## 2019-04-03 NOTE — Telephone Encounter (Signed)
[name], patient's daughter, called stating patient saw Dr Candiss Norse earlier this week and was started on Amlodipine 5 mg daily but they are not sure if patient is to continue taking Losartan also? Office notes are in epic. Patient's b/p at their office that day was 173/78. Cindy's CB is [phone].

## 2019-04-03 NOTE — Telephone Encounter (Signed)
Spoken and notified patient's daughter of Kate Clark's comments. Patient's daughter verbalized understanding.  

## 2019-04-03 NOTE — Telephone Encounter (Signed)
Based off of what I can see it looks like it was intended for her to continue losartan 50 mg and add in amlodipine 5 mg. Her BP was 173/78 so this makes sense. They are welcome to call the nephrologist's office for clarification but it seems like she should be taking both. I added this to her med list.

## 2019-04-11 ENCOUNTER — Other Ambulatory Visit: Payer: Self-pay | Admitting: Primary Care

## 2019-04-11 DIAGNOSIS — H8110 Benign paroxysmal vertigo, unspecified ear: Secondary | ICD-10-CM

## 2019-04-13 NOTE — Telephone Encounter (Signed)
Last prescribed on 01/24/2016 . Last appointment on 02/02/2019. Next future appointment on 08/12/2019

## 2019-04-13 NOTE — Telephone Encounter (Signed)
Will provided #10 tablets. Recommend follow-up with PCP if symptoms have returned.

## 2019-04-15 ENCOUNTER — Ambulatory Visit: Payer: Medicare Other

## 2019-05-15 ENCOUNTER — Other Ambulatory Visit: Payer: Self-pay | Admitting: Primary Care

## 2019-05-15 DIAGNOSIS — I10 Essential (primary) hypertension: Secondary | ICD-10-CM

## 2019-06-19 ENCOUNTER — Other Ambulatory Visit: Payer: Self-pay

## 2019-06-19 ENCOUNTER — Ambulatory Visit
Admission: RE | Admit: 2019-06-19 | Discharge: 2019-06-19 | Disposition: A | Discharge status: Home / Self Care | Payer: Medicare PPO | Source: Ambulatory Visit | Attending: Primary Care | Admitting: Primary Care

## 2019-06-19 DIAGNOSIS — Z1231 Encounter for screening mammogram for malignant neoplasm of breast: Secondary | ICD-10-CM

## 2019-07-23 ENCOUNTER — Other Ambulatory Visit: Payer: Self-pay | Admitting: Primary Care

## 2019-07-23 DIAGNOSIS — I10 Essential (primary) hypertension: Secondary | ICD-10-CM

## 2019-07-23 DIAGNOSIS — E119 Type 2 diabetes mellitus without complications: Secondary | ICD-10-CM

## 2019-07-23 DIAGNOSIS — E785 Hyperlipidemia, unspecified: Secondary | ICD-10-CM

## 2019-08-07 ENCOUNTER — Other Ambulatory Visit (INDEPENDENT_AMBULATORY_CARE_PROVIDER_SITE_OTHER): Payer: Medicare PPO

## 2019-08-07 DIAGNOSIS — E119 Type 2 diabetes mellitus without complications: Secondary | ICD-10-CM

## 2019-08-07 DIAGNOSIS — I10 Essential (primary) hypertension: Secondary | ICD-10-CM | POA: Diagnosis not present

## 2019-08-07 DIAGNOSIS — E785 Hyperlipidemia, unspecified: Secondary | ICD-10-CM | POA: Diagnosis not present

## 2019-08-07 LAB — HEMOGLOBIN A1C: Hgb A1c MFr Bld: 6.3 % (ref 4.6–6.5)

## 2019-08-07 LAB — COMPREHENSIVE METABOLIC PANEL
ALT: 16 U/L (ref 0–35)
AST: 21 U/L (ref 0–37)
Albumin: 4.1 g/dL (ref 3.5–5.2)
Alkaline Phosphatase: 80 U/L (ref 39–117)
BUN: 22 mg/dL (ref 6–23)
CO2: 28 mEq/L (ref 19–32)
Calcium: 9.7 mg/dL (ref 8.4–10.5)
Chloride: 104 mEq/L (ref 96–112)
Creatinine, Ser: 1.4 mg/dL — ABNORMAL HIGH (ref 0.40–1.20)
GFR: 35.5 mL/min — ABNORMAL LOW (ref >60.00)
Glucose, Bld: 139 mg/dL — ABNORMAL HIGH (ref 70–99)
Potassium: 4.3 mEq/L (ref 3.5–5.1)
Sodium: 141 mEq/L (ref 135–145)
Total Bilirubin: 0.7 mg/dL (ref 0.2–1.2)
Total Protein: 6.5 g/dL (ref 6.0–8.3)

## 2019-08-07 LAB — LIPID PANEL
Cholesterol: 145 mg/dL (ref 0–200)
HDL: 57.7 mg/dL (ref >39.00)
LDL Cholesterol: 69 mg/dL (ref 0–99)
NonHDL: 87.3
Total CHOL/HDL Ratio: 3
Triglycerides: 91 mg/dL (ref 0.0–149.0)
VLDL: 18.2 mg/dL (ref 0.0–40.0)

## 2019-08-12 ENCOUNTER — Other Ambulatory Visit: Payer: Self-pay

## 2019-08-12 ENCOUNTER — Ambulatory Visit (INDEPENDENT_AMBULATORY_CARE_PROVIDER_SITE_OTHER): Payer: Medicare PPO | Admitting: Primary Care

## 2019-08-12 ENCOUNTER — Encounter: Payer: Self-pay | Admitting: Primary Care

## 2019-08-12 VITALS — BP 126/76 | HR 61 | Temp 95.9°F | Ht 62.75 in | Wt 155.8 lb

## 2019-08-12 DIAGNOSIS — I10 Essential (primary) hypertension: Secondary | ICD-10-CM | POA: Diagnosis not present

## 2019-08-12 DIAGNOSIS — F4323 Adjustment disorder with mixed anxiety and depressed mood: Secondary | ICD-10-CM

## 2019-08-12 DIAGNOSIS — Z0000 Encounter for general adult medical examination without abnormal findings: Secondary | ICD-10-CM | POA: Diagnosis not present

## 2019-08-12 DIAGNOSIS — M1611 Unilateral primary osteoarthritis, right hip: Secondary | ICD-10-CM

## 2019-08-12 DIAGNOSIS — K5900 Constipation, unspecified: Secondary | ICD-10-CM

## 2019-08-12 DIAGNOSIS — N1832 Chronic kidney disease, stage 3b: Secondary | ICD-10-CM

## 2019-08-12 DIAGNOSIS — R7303 Prediabetes: Secondary | ICD-10-CM | POA: Diagnosis not present

## 2019-08-12 DIAGNOSIS — I2510 Atherosclerotic heart disease of native coronary artery without angina pectoris: Secondary | ICD-10-CM | POA: Diagnosis not present

## 2019-08-12 DIAGNOSIS — E785 Hyperlipidemia, unspecified: Secondary | ICD-10-CM

## 2019-08-12 NOTE — Assessment & Plan Note (Signed)
Chronic, doing well on daily docusate sodium and PRN Miralax. Continue same.

## 2019-08-12 NOTE — Assessment & Plan Note (Signed)
Well controlled in the office today. Continue amlodipine and losartan. CMP reviewed.

## 2019-08-12 NOTE — Assessment & Plan Note (Signed)
Significantly improved, denies concerns today. No longer on Zoloft. Continue to monitor.

## 2019-08-12 NOTE — Assessment & Plan Note (Signed)
Well controlled with A1C of 6.3. Continue off medications.  She will schedule eye exam. Pneumonia vaccination UTD. Managed on ARB and statin.  A1C has been under the diabetic range for over one year. Changing diagnosis to prediabetes.

## 2019-08-12 NOTE — Assessment & Plan Note (Signed)
Chronic to right side, history of left replacement.  Undergoing injections per orthopedics.

## 2019-08-12 NOTE — Progress Notes (Signed)
Subjective:    Patient ID: [name], female    DOB: 1929-09-09, 86 y.o.   MRN: [id number]  HPI  This visit occurred during the SARS-CoV-2 public health emergency.  Safety protocols were in place, including screening questions prior to the visit, additional usage of staff PPE, and extensive cleaning of exam room while observing appropriate contact time as indicated for disinfecting solutions.   [name] is a 86 year old female who presents today for complete physical.  Immunizations: -Influenza: Completed last season  -Shingles: Completed Shingrix -Pneumonia: Completed Prevnar and pneumovax  -Covid-19: Completed series  Diet: She endorses a healthy diet. She drinks plenty of water.  Exercise: She tries to walk daily.   Eye exam: No recent exam Dental exam: Completes annually   Mammogram: Completed in 2021 Dexa: Osteopenia, not taking calcium and vitamin D Colonoscopy: N/A given age  BP Readings from Last 3 Encounters:  08/12/19 126/76  02/02/19 124/76  12/17/18 124/86     Review of Systems  Constitutional: Negative for unexpected weight change.  HENT: Negative for rhinorrhea.   Respiratory: Negative for cough and shortness of breath.   Cardiovascular: Negative for chest pain.  Gastrointestinal: Negative for constipation and diarrhea.  Genitourinary: Negative for difficulty urinating.  Musculoskeletal: Negative for arthralgias and myalgias.  Skin: Negative for rash.  Allergic/Immunologic: Negative for environmental allergies.  Neurological: Negative for dizziness, numbness and headaches.  Psychiatric/Behavioral: The patient is not nervous/anxious.        Past Medical History:  Diagnosis Date   Arthritis    Breast cancer ([place]) 2001   left    CAD (coronary artery disease)    Asymptomatic   Diabetes mellitus ([place])    Diverticulosis    Hiatal hernia    History of shingles    ON BACK   Hyperlipidemia    Hypertension    Kidney stone     PVC (premature ventricular contraction)    Vertigo      Social History   Socioeconomic History   Marital status: Widowed    Spouse name: Not on file   Number of children: Not on file   Years of education: Not on file   Highest education level: Not on file  Occupational History   Not on file  Tobacco Use   Smoking status: Never Smoker   Smokeless tobacco: Never Used  Vaping Use   Vaping Use: Never used  Substance and Sexual Activity   Alcohol use: No   Drug use: No   Sexual activity: Not Currently  Other Topics Concern   Not on file  Social History Narrative   Widow.    Lives by herself at home.   Volunteers at [employer].   Enjoys volunteering, helping her neighbors, spending time with family, church.   Social Determinants of Health   Financial Resource Strain:    Difficulty of Paying Living Expenses:   Food Insecurity:    Worried About Charity fundraiser in the Last Year:    Arboriculturist in the Last Year:   Transportation Needs:    Film/video editor (Medical):    Lack of Transportation (Non-Medical):   Physical Activity:    Days of Exercise per Week:    Minutes of Exercise per Session:   Stress:    Feeling of Stress :   Social Connections:    Frequency of Communication with Friends and Family:    Frequency of Social Gatherings with Friends and Family:  Attends Religious Services:    Active Member of Clubs or Organizations:    Attends Music therapist:    Marital Status:   Intimate Partner Violence:    Fear of Current or Ex-Partner:    Emotionally Abused:    Physically Abused:    Sexually Abused:     Past Surgical History:  Procedure Laterality Date   BREAST LUMPECTOMY Left 2001   BREAST LUMPECTOMY WITH AXILLARY LYMPH NODE DISSECTION  2003   left   CARDIAC CATHETERIZATION  07/13/2009   Recommendation - medical therapy   CARDIOVASCULAR STRESS TEST  06/16/2009   Mild ischemia demonstrated in the basal  inferior, mid inferior, and apical inferior regions.   CATARACTS REMOVED     CHOLECYSTECTOMY     JOINT REPLACEMENT     BILATERAL TOTAL KNEES   KNEE ARTHROPLASTY Right 2009   TOTAL HIP ARTHROPLASTY Left 11/03/2014   Procedure: LEFT TOTAL HIP ARTHROPLASTY ANTERIOR APPROACH;  Surgeon: Gaynelle Arabian, MD;  Location: [facility];  Service: Orthopedics;  Laterality: Left;   TRANSTHORACIC ECHOCARDIOGRAM  06/16/2009   EF >55%, no significant valvular abnormalities.    Family History  Problem Relation Age of Onset   Heart attack Mother    Cancer Mother    Stroke Father    Stroke Brother    Stroke Brother     No Known Allergies  Current Outpatient Medications on File Prior to Visit  Medication Sig Dispense Refill   Accu-Chek FastClix Lancets MISC Check blood sugar 1 - 2 times daily and as directed. E11.9 100 each 3   amLODipine (NORVASC) 5 MG tablet Take 5 mg by mouth daily.     aspirin 81 MG tablet Take 81 mg by mouth daily.     atorvastatin (LIPITOR) 10 MG tablet Take 1 tablet (10 mg total) by mouth every evening. For cholesterol. 90 tablet 3   Blood Glucose Monitoring Suppl (ACCU-CHEK AVIVA PLUS) w/Device KIT Check blood sugar 1 - 2 times daily and as directed. E11.9 1 kit 0   losartan (COZAAR) 50 MG tablet TAKE 1 TABLET BY MOUTH ONCE DAILY FOR BLOOD PRESSURE 90 tablet 3   ONETOUCH ULTRA test strip USE AS DIRECTED TO CHECK BLOOD SUGARS ONCE OR TWICE DAILY AS DIRECTED 100 each 5   No current facility-administered medications on file prior to visit.    BP 126/76    Pulse 61    Temp (!) 95.9 F (35.5 C) (Temporal)    Ht 5' 2.75" (1.594 m)    Wt 155 lb 12 oz (70.6 kg)    SpO2 94%    BMI 27.81 kg/m    Objective:   Physical Exam HENT:     Right Ear: Tympanic membrane and ear canal normal.     Left Ear: Tympanic membrane and ear canal normal.  Eyes:     Pupils: Pupils are equal, round, and reactive to light.  Cardiovascular:     Rate and Rhythm: Normal rate and regular  rhythm.  Pulmonary:     Effort: Pulmonary effort is normal.     Breath sounds: Normal breath sounds.  Abdominal:     General: Bowel sounds are normal.     Palpations: Abdomen is soft.     Tenderness: There is no abdominal tenderness.  Musculoskeletal:     Cervical back: Neck supple.     Comments: Slight decrease in ROM to right hip, ambulates well  Skin:    General: Skin is warm and dry.  Neurological:  Mental Status: She is alert and oriented to person, place, and time.     Cranial Nerves: No cranial nerve deficit.     Deep Tendon Reflexes:     Reflex Scores:      Patellar reflexes are 2+ on the right side and 2+ on the left side.           Assessment & Plan:

## 2019-08-12 NOTE — Patient Instructions (Addendum)
Start taking calcium 1200 mg and vitamin D 800 IU daily.  Continue to walk daily.  Continue to eat a healthy diet, drink plenty of water.  Schedule an eye appointment.  It was a pleasure to see you today!   Preventive Care 84 Years and Older, Female Preventive care refers to lifestyle choices and visits with your health care provider that can promote health and wellness. This includes:  A yearly physical exam. This is also called an annual well check.  Regular dental and eye exams.  Immunizations.  Screening for certain conditions.  Healthy lifestyle choices, such as diet and exercise. What can I expect for my preventive care visit? Physical exam Your health care provider will check:  Height and weight. These may be used to calculate body mass index (BMI), which is a measurement that tells if you are at a healthy weight.  Heart rate and blood pressure.  Your skin for abnormal spots. Counseling Your health care provider may ask you questions about:  Alcohol, tobacco, and drug use.  Emotional well-being.  Home and relationship well-being.  Sexual activity.  Eating habits.  History of falls.  Memory and ability to understand (cognition).  Work and work Statistician.  Pregnancy and menstrual history. What immunizations do I need?  Influenza (flu) vaccine  This is recommended every year. Tetanus, diphtheria, and pertussis (Tdap) vaccine  You may need a Td booster every 10 years. Varicella (chickenpox) vaccine  You may need this vaccine if you have not already been vaccinated. Zoster (shingles) vaccine  You may need this after age 84. Pneumococcal conjugate (PCV13) vaccine  One dose is recommended after age 84. Pneumococcal polysaccharide (PPSV23) vaccine  One dose is recommended after age 84. Measles, mumps, and rubella (MMR) vaccine  You may need at least one dose of MMR if you were born in 1957 or later. You may also need a second  dose. Meningococcal conjugate (MenACWY) vaccine  You may need this if you have certain conditions. Hepatitis A vaccine  You may need this if you have certain conditions or if you travel or work in places where you may be exposed to hepatitis A. Hepatitis B vaccine  You may need this if you have certain conditions or if you travel or work in places where you may be exposed to hepatitis B. Haemophilus influenzae type b (Hib) vaccine  You may need this if you have certain conditions. You may receive vaccines as individual doses or as more than one vaccine together in one shot (combination vaccines). Talk with your health care provider about the risks and benefits of combination vaccines. What tests do I need? Blood tests  Lipid and cholesterol levels. These may be checked every 5 years, or more frequently depending on your overall health.  Hepatitis C test.  Hepatitis B test. Screening  Lung cancer screening. You may have this screening every year starting at age 84 if you have a 30-pack-year history of smoking and currently smoke or have quit within the past 15 years.  Colorectal cancer screening. All adults should have this screening starting at age 84 and continuing until age 84. Your health care provider may recommend screening at age 84 if you are at increased risk. You will have tests every 1-10 years, depending on your results and the type of screening test.  Diabetes screening. This is done by checking your blood sugar (glucose) after you have not eaten for a while (fasting). You may have this done every 1-3 years.  Mammogram. This may be done every 1-2 years. Talk with your health care provider about how often you should have regular mammograms.  BRCA-related cancer screening. This may be done if you have a family history of breast, ovarian, tubal, or peritoneal cancers. Other tests  Sexually transmitted disease (STD) testing.  Bone density scan. This is done to screen for  osteoporosis. You may have this done starting at age 84. Follow these instructions at home: Eating and drinking  Eat a diet that includes fresh fruits and vegetables, whole grains, lean protein, and low-fat dairy products. Limit your intake of foods with high amounts of sugar, saturated fats, and salt.  Take vitamin and mineral supplements as recommended by your health care provider.  Do not drink alcohol if your health care provider tells you not to drink.  If you drink alcohol: ? Limit how much you have to 0-1 drink a day. ? Be aware of how much alcohol is in your drink. In the [place] one drink equals one 12 oz bottle of beer (355 mL), one 5 oz glass of wine (148 mL), or one 1 oz glass of hard liquor (44 mL). Lifestyle  Take daily care of your teeth and gums.  Stay active. Exercise for at least 30 minutes on 5 or more days each week.  Do not use any products that contain nicotine or tobacco, such as cigarettes, e-cigarettes, and chewing tobacco. If you need help quitting, ask your health care provider.  If you are sexually active, practice safe sex. Use a condom or other form of protection in order to prevent STIs (sexually transmitted infections).  Talk with your health care provider about taking a low-dose aspirin or statin. What's next?  Go to your health care provider once a year for a well check visit.  Ask your health care provider how often you should have your eyes and teeth checked.  Stay up to date on all vaccines. This information is not intended to replace advice given to you by your health care provider. Make sure you discuss any questions you have with your health care provider. Document Revised: 01/23/2018 Document Reviewed: 01/23/2018 Elsevier Patient Education  2020 [employer].

## 2019-08-12 NOTE — Assessment & Plan Note (Signed)
Asymptomatic.  Compliant to statin and aspirin.  LDL at goal. Continue to monitor.

## 2019-08-12 NOTE — Assessment & Plan Note (Signed)
Well controlled on statin therapy. Continue same.

## 2019-08-12 NOTE — Assessment & Plan Note (Signed)
Immunizations UTD. Mammogram UTD. Bone density UTD, will start calcium and vitamin D. Encouraged a healthy diet, regular exercise.  Exam today stable. Labs reviewed.

## 2019-08-12 NOTE — Assessment & Plan Note (Signed)
Recent renal level stable, following with nephrology, managed on ARB.

## 2019-12-28 DIAGNOSIS — N1832 Chronic kidney disease, stage 3b: Secondary | ICD-10-CM | POA: Diagnosis not present

## 2019-12-28 DIAGNOSIS — Z87442 Personal history of urinary calculi: Secondary | ICD-10-CM | POA: Diagnosis not present

## 2019-12-28 DIAGNOSIS — I10 Essential (primary) hypertension: Secondary | ICD-10-CM | POA: Diagnosis not present

## 2020-01-04 DIAGNOSIS — N1832 Chronic kidney disease, stage 3b: Secondary | ICD-10-CM | POA: Diagnosis not present

## 2020-01-04 DIAGNOSIS — I10 Essential (primary) hypertension: Secondary | ICD-10-CM | POA: Diagnosis not present

## 2020-01-04 DIAGNOSIS — E1122 Type 2 diabetes mellitus with diabetic chronic kidney disease: Secondary | ICD-10-CM | POA: Diagnosis not present

## 2020-01-04 DIAGNOSIS — Z87442 Personal history of urinary calculi: Secondary | ICD-10-CM | POA: Diagnosis not present

## 2020-01-11 ENCOUNTER — Other Ambulatory Visit: Payer: Self-pay

## 2020-01-11 ENCOUNTER — Ambulatory Visit: Payer: Medicare PPO | Admitting: Primary Care

## 2020-01-11 VITALS — BP 122/60 | HR 81 | Temp 97.2°F | Ht 62.75 in | Wt 149.0 lb

## 2020-01-11 DIAGNOSIS — N1832 Chronic kidney disease, stage 3b: Secondary | ICD-10-CM

## 2020-01-11 DIAGNOSIS — F4323 Adjustment disorder with mixed anxiety and depressed mood: Secondary | ICD-10-CM

## 2020-01-11 DIAGNOSIS — R7303 Prediabetes: Secondary | ICD-10-CM | POA: Diagnosis not present

## 2020-01-11 DIAGNOSIS — E119 Type 2 diabetes mellitus without complications: Secondary | ICD-10-CM

## 2020-01-11 LAB — POCT GLYCOSYLATED HEMOGLOBIN (HGB A1C): Hemoglobin A1C: 6.2 % — AB (ref 4.0–5.6)

## 2020-01-11 MED ORDER — SERTRALINE HCL 25 MG PO TABS: 25.0000 mg | ORAL_TABLET | Freq: Every day | ORAL | 3 refills | Status: DC

## 2020-01-11 NOTE — Patient Instructions (Signed)
Resume sertraline (Zoloft) 25 mg once daily for anxiety and depression.  Continue to work on water intake and a healthy diet.   It was a pleasure to see you today!

## 2020-01-11 NOTE — Assessment & Plan Note (Signed)
Well controlled with A1C today of 6.2. Continue off medications.  Foot exam completed today. Managed on statin and ARB. Pneumonia vaccination UTD.  Continue to monitor.

## 2020-01-11 NOTE — Assessment & Plan Note (Signed)
Resume Zoloft 25 mg as symptoms of "jitteryness" resolved when taking. Discussed to take this everyday and to notify me next time if she wishes to stop.

## 2020-01-11 NOTE — Assessment & Plan Note (Signed)
Following with nephrology, no changes to regimen.

## 2020-01-11 NOTE — Progress Notes (Signed)
Subjective:    Patient ID: [name], female    DOB: 1929/08/26, 86 y.o.   MRN: [id number]  HPI  This visit occurred during the SARS-CoV-2 public health emergency.  Safety protocols were in place, including screening questions prior to the visit, additional usage of staff PPE, and extensive cleaning of exam room while observing appropriate contact time as indicated for disinfecting solutions.   [name] is a 86 year old female with a history of CKD, hyperlipidemia, prediabetes, CAD, hypertension, anxiety and depression who presents today for follow up.  1) CKD: Currently following with nephrology, last visit was last week. No changes to prescribed regimen of losartan, aspirin, and atorvastatin. She was encouraged to drink 40 ounces of water daily.   2) Anxiety and Depression: Chronic history with symptoms of "feeling jittery" which improved after introduction of Zoloft 25 mg daily. She took the Zoloft briefly and then stopped as she "fetl better". Unfortunately symptoms returned shortly after. She decided to resume Zoloft last week, only had three tablets and is now out. She is requesting a refill today.  3) Prediabetes:   Current medications include: None  She is checking her blood glucose 1 times daily and is getting readings of:  AM fasting 110's  Last A1C: 6.3 in June 2021,  Last Eye Exam: Due Last Foot Exam: Due Pneumonia Vaccination: UTD ACE/ARB: losartan  Statin: atovastatin   BP Readings from Last 3 Encounters:  01/11/20 122/60  08/12/19 126/76  02/02/19 124/76     Review of Systems  Eyes: Negative for visual disturbance.  Cardiovascular: Negative for chest pain.  Neurological: Negative for dizziness and numbness.  Psychiatric/Behavioral: The patient is nervous/anxious.        See HPI       Past Medical History:  Diagnosis Date  . Arthritis   . Breast cancer ([place]) 2001   left   . CAD (coronary artery disease)    Asymptomatic  . Diabetes  mellitus ([place])   . Diverticulosis   . Hiatal hernia   . History of shingles    ON BACK  . Hyperlipidemia   . Hypertension   . Kidney stone   . PVC (premature ventricular contraction)   . Vertigo      Social History   Socioeconomic History  . Marital status: Widowed    Spouse name: Not on file  . Number of children: Not on file  . Years of education: Not on file  . Highest education level: Not on file  Occupational History  . Not on file  Tobacco Use  . Smoking status: Never Smoker  . Smokeless tobacco: Never Used  Vaping Use  . Vaping Use: Never used  Substance and Sexual Activity  . Alcohol use: No  . Drug use: No  . Sexual activity: Not Currently  Other Topics Concern  . Not on file  Social History Narrative   Widow.    Lives by herself at home.   Volunteers at [employer].   Enjoys volunteering, helping her neighbors, spending time with family, church.   Social Determinants of Health   Financial Resource Strain:   . Difficulty of Paying Living Expenses: Not on file  Food Insecurity:   . Worried About Charity fundraiser in the Last Year: Not on file  . Ran Out of Food in the Last Year: Not on file  Transportation Needs:   . Lack of Transportation (Medical): Not on file  . Lack of Transportation (Non-Medical): Not on  file  Physical Activity:   . Days of Exercise per Week: Not on file  . Minutes of Exercise per Session: Not on file  Stress:   . Feeling of Stress : Not on file  Social Connections:   . Frequency of Communication with Friends and Family: Not on file  . Frequency of Social Gatherings with Friends and Family: Not on file  . Attends Religious Services: Not on file  . Active Member of Clubs or Organizations: Not on file  . Attends Archivist Meetings: Not on file  . Marital Status: Not on file  Intimate Partner Violence:   . Fear of Current or Ex-Partner: Not on file  . Emotionally Abused: Not on file  . Physically Abused: Not on file  .  Sexually Abused: Not on file    Past Surgical History:  Procedure Laterality Date  . BREAST LUMPECTOMY Left 2001  . BREAST LUMPECTOMY WITH AXILLARY LYMPH NODE DISSECTION  2003   left  . CARDIAC CATHETERIZATION  07/13/2009   Recommendation - medical therapy  . CARDIOVASCULAR STRESS TEST  06/16/2009   Mild ischemia demonstrated in the basal inferior, mid inferior, and apical inferior regions.  Marland Kitchen CATARACTS REMOVED    . CHOLECYSTECTOMY    . JOINT REPLACEMENT     BILATERAL TOTAL KNEES  . KNEE ARTHROPLASTY Right 2009  . TOTAL HIP ARTHROPLASTY Left 11/03/2014   Procedure: LEFT TOTAL HIP ARTHROPLASTY ANTERIOR APPROACH;  Surgeon: Gaynelle Arabian, MD;  Location: [facility];  Service: Orthopedics;  Laterality: Left;  . TRANSTHORACIC ECHOCARDIOGRAM  06/16/2009   EF >55%, no significant valvular abnormalities.    Family History  Problem Relation Age of Onset  . Heart attack Mother   . Cancer Mother   . Stroke Father   . Stroke Brother   . Stroke Brother     No Known Allergies  Current Outpatient Medications on File Prior to Visit  Medication Sig Dispense Refill  . Accu-Chek FastClix Lancets MISC Check blood sugar 1 - 2 times daily and as directed. E11.9 100 each 3  . amLODipine (NORVASC) 5 MG tablet Take 5 mg by mouth daily.    Marland Kitchen aspirin 81 MG tablet Take 81 mg by mouth daily.    Marland Kitchen atorvastatin (LIPITOR) 10 MG tablet Take 1 tablet (10 mg total) by mouth every evening. For cholesterol. 90 tablet 3  . Blood Glucose Monitoring Suppl (ACCU-CHEK AVIVA PLUS) w/Device KIT Check blood sugar 1 - 2 times daily and as directed. E11.9 1 kit 0  . losartan (COZAAR) 50 MG tablet TAKE 1 TABLET BY MOUTH ONCE DAILY FOR BLOOD PRESSURE 90 tablet 3  . ONETOUCH ULTRA test strip USE AS DIRECTED TO CHECK BLOOD SUGARS ONCE OR TWICE DAILY AS DIRECTED 100 each 5   No current facility-administered medications on file prior to visit.    BP 122/60   Pulse 81   Temp (!) 97.2 F (36.2 C) (Temporal)   Ht 5' 2.75" (1.594 m)    Wt 149 lb (67.6 kg)   SpO2 97%   BMI 26.60 kg/m    Objective:   Physical Exam Cardiovascular:     Rate and Rhythm: Normal rate and regular rhythm.  Pulmonary:     Effort: Pulmonary effort is normal.     Breath sounds: Normal breath sounds.  Musculoskeletal:     Cervical back: Neck supple.  Skin:    General: Skin is warm and dry.            Assessment &  Plan:

## 2020-04-04 ENCOUNTER — Other Ambulatory Visit: Payer: Self-pay | Admitting: Primary Care

## 2020-04-04 DIAGNOSIS — E785 Hyperlipidemia, unspecified: Secondary | ICD-10-CM

## 2020-05-16 ENCOUNTER — Other Ambulatory Visit: Payer: Self-pay | Admitting: Primary Care

## 2020-05-16 DIAGNOSIS — I10 Essential (primary) hypertension: Secondary | ICD-10-CM

## 2020-06-19 DIAGNOSIS — W57XXXA Bitten or stung by nonvenomous insect and other nonvenomous arthropods, initial encounter: Secondary | ICD-10-CM | POA: Diagnosis not present

## 2020-06-19 DIAGNOSIS — S30861A Insect bite (nonvenomous) of abdominal wall, initial encounter: Secondary | ICD-10-CM | POA: Diagnosis not present

## 2020-06-19 DIAGNOSIS — S30851A Superficial foreign body of abdominal wall, initial encounter: Secondary | ICD-10-CM | POA: Diagnosis not present

## 2020-06-24 DIAGNOSIS — M25551 Pain in right hip: Secondary | ICD-10-CM | POA: Diagnosis not present

## 2020-07-04 DIAGNOSIS — N1832 Chronic kidney disease, stage 3b: Secondary | ICD-10-CM | POA: Diagnosis not present

## 2020-07-04 DIAGNOSIS — E1122 Type 2 diabetes mellitus with diabetic chronic kidney disease: Secondary | ICD-10-CM | POA: Diagnosis not present

## 2020-07-04 DIAGNOSIS — Z87442 Personal history of urinary calculi: Secondary | ICD-10-CM | POA: Diagnosis not present

## 2020-07-04 DIAGNOSIS — I10 Essential (primary) hypertension: Secondary | ICD-10-CM | POA: Diagnosis not present

## 2020-07-06 DIAGNOSIS — M25551 Pain in right hip: Secondary | ICD-10-CM | POA: Diagnosis not present

## 2020-07-21 ENCOUNTER — Ambulatory Visit: Payer: Medicare PPO | Admitting: Family Medicine

## 2020-07-26 ENCOUNTER — Encounter: Payer: Self-pay | Admitting: Family Medicine

## 2020-07-26 ENCOUNTER — Ambulatory Visit: Payer: Medicare PPO | Admitting: Family Medicine

## 2020-07-26 ENCOUNTER — Other Ambulatory Visit: Payer: Self-pay

## 2020-07-26 VITALS — BP 90/70 | HR 85 | Temp 98.7°F | Ht 61.0 in | Wt 135.5 lb

## 2020-07-26 DIAGNOSIS — S30861D Insect bite (nonvenomous) of abdominal wall, subsequent encounter: Secondary | ICD-10-CM

## 2020-07-26 DIAGNOSIS — W57XXXD Bitten or stung by nonvenomous insect and other nonvenomous arthropods, subsequent encounter: Secondary | ICD-10-CM

## 2020-07-26 DIAGNOSIS — W57XXXA Bitten or stung by nonvenomous insect and other nonvenomous arthropods, initial encounter: Secondary | ICD-10-CM | POA: Insufficient documentation

## 2020-07-26 DIAGNOSIS — S30861A Insect bite (nonvenomous) of abdominal wall, initial encounter: Secondary | ICD-10-CM | POA: Insufficient documentation

## 2020-07-26 MED ORDER — TRIAMCINOLONE ACETONIDE 0.1 % EX CREA: 1.0000 "application " | TOPICAL_CREAM | Freq: Two times a day (BID) | CUTANEOUS | 0 refills | Status: AC

## 2020-07-26 NOTE — Assessment & Plan Note (Signed)
No local infection. No symptoms of tick borne illness  And out of time range for symptoms.  Treat post inflammatory changes with topical steroid cream.

## 2020-07-26 NOTE — Progress Notes (Signed)
Patient ID: [name], female    DOB: [birth year], 87 y.o.   MRN: [id number]  This visit was conducted in person.  BP 90/70   Pulse 85   Temp 98.7 F (37.1 C) (Temporal)   Ht _0  (1.549 m)   Wt 135 lb 8 oz (61.5 kg)   SpO2 98%   BMI 25.60 kg/m    CC: Chief Complaint  Patient presents with   Insect Bite    Tick bite-Left Side-Seen at Urgent Care 06/19/20-Wants to make sure everything has healed okay    Subjective:   HPI: [name] is a 87 y.o. female presenting on 07/26/2020 for Insect Bite (Tick bite-Left Side-Seen at Urgent Care 06/19/20-Wants to make sure everything has healed okay)    S/P tick bite on left abdomen  Seen at urgent Care on 06/19/2020. There was some difficulty removing the tick  She reports  he still has itchiness on left side... still feels nodule on that side. Redness at site.. not spreading. She was given doxycycline x 10 days  No fever, no chills, no flu- like symptoms. No  neuro changes, no confusion. No joint pain.   She is trying to keep up with liquids. BP Readings from Last 3 Encounters:  07/26/20 90/70  01/11/20 122/60  08/12/19 126/76     Relevant past medical, surgical, family and social history reviewed and updated as indicated. Interim medical history since our last visit reviewed. Allergies and medications reviewed and updated. Outpatient Medications Prior to Visit  Medication Sig Dispense Refill   Accu-Chek FastClix Lancets MISC CHECK BLOOD SUGAR 1 TO 2 TIMES DAILY ANDAS DIRECTED 102 each 3   ACCU-CHEK SMARTVIEW test strip CHECK BLOOD SUGAR 1 TO 2 TIMES DAILY ANDAS DIRECTED 100 each 3   amLODipine (NORVASC) 5 MG tablet Take 5 mg by mouth daily.     aspirin 81 MG tablet Take 81 mg by mouth daily.     atorvastatin (LIPITOR) 10 MG tablet TAKE 1 TABLET BY MOUTH ONCE DAILY EVERY EVENING FOR CHOLESTEROL 90 tablet 1   Blood Glucose Monitoring Suppl (ACCU-CHEK AVIVA PLUS) w/Device KIT Check blood sugar 1 - 2 times  daily and as directed. E11.9 1 kit 0   losartan (COZAAR) 50 MG tablet TAKE 1 TABLET BY MOUTH ONCE DAILY FOR BLOOD PRESSURE 90 tablet 0   sertraline (ZOLOFT) 25 MG tablet Take 1 tablet (25 mg total) by mouth daily. For anxiety. 90 tablet 3   No facility-administered medications prior to visit.     Per HPI unless specifically indicated in ROS section below Review of Systems  Constitutional:  Negative for fatigue and fever.  HENT:  Negative for ear pain.   Eyes:  Negative for pain.  Respiratory:  Negative for chest tightness and shortness of breath.   Cardiovascular:  Negative for chest pain, palpitations and leg swelling.  Gastrointestinal:  Negative for abdominal pain.  Genitourinary:  Negative for dysuria.  Objective:  BP 90/70   Pulse 85   Temp 98.7 F (37.1 C) (Temporal)   Ht _1  (1.549 m)   Wt 135 lb 8 oz (61.5 kg)   SpO2 98%   BMI 25.60 kg/m   Wt Readings from Last 3 Encounters:  07/26/20 135 lb 8 oz (61.5 kg)  01/11/20 149 lb (67.6 kg)  08/12/19 155 lb 12 oz (70.6 kg)      Physical Exam Constitutional:      General: She is not in acute distress.  Appearance: Normal appearance. She is well-developed. She is not ill-appearing or toxic-appearing.  HENT:     Head: Normocephalic.     Right Ear: Hearing, tympanic membrane, ear canal and external ear normal. Tympanic membrane is not erythematous, retracted or bulging.     Left Ear: Hearing, tympanic membrane, ear canal and external ear normal. Tympanic membrane is not erythematous, retracted or bulging.     Nose: No mucosal edema or rhinorrhea.     Right Sinus: No maxillary sinus tenderness or frontal sinus tenderness.     Left Sinus: No maxillary sinus tenderness or frontal sinus tenderness.     Mouth/Throat:     Pharynx: Uvula midline.  Eyes:     General: Lids are normal. Lids are everted, no foreign bodies appreciated.     Conjunctiva/sclera: Conjunctivae normal.     Pupils: Pupils are equal, round, and reactive  to light.  Neck:     Thyroid: No thyroid mass or thyromegaly.     Vascular: No carotid bruit.     Trachea: Trachea normal.  Cardiovascular:     Rate and Rhythm: Normal rate and regular rhythm.     Pulses: Normal pulses.     Heart sounds: Normal heart sounds, S1 normal and S2 normal. No murmur heard.   No friction rub. No gallop.  Pulmonary:     Effort: Pulmonary effort is normal. No tachypnea or respiratory distress.     Breath sounds: Normal breath sounds. No decreased breath sounds, wheezing, rhonchi or rales.  Abdominal:     General: Bowel sounds are normal.     Palpations: Abdomen is soft.     Tenderness: There is no abdominal tenderness.  Musculoskeletal:     Cervical back: Normal range of motion and neck supple.  Skin:    General: Skin is warm and dry.     Findings: No rash.     Comments:  Hyperpigmentation and pea size nodule on left torso, laterally. No fluctuance, no heat, no erythema  Neurological:     Mental Status: She is alert and oriented to person, place, and time.     GCS: GCS eye subscore is 4. GCS verbal subscore is 5. GCS motor subscore is 6.     Cranial Nerves: No cranial nerve deficit.     Sensory: No sensory deficit.     Motor: No abnormal muscle tone.     Coordination: Coordination normal.     Gait: Gait normal.     Deep Tendon Reflexes: Reflexes are normal and symmetric.     Comments: Nml cerebellar exam   No papilledema  Psychiatric:        Mood and Affect: Mood is not anxious or depressed.        Speech: Speech normal.        Behavior: Behavior normal. Behavior is cooperative.        Thought Content: Thought content normal.        Cognition and Memory: Memory is not impaired. She does not exhibit impaired recent memory or impaired remote memory.        Judgment: Judgment normal.      Results for orders placed or performed in visit on 01/11/20  POCT glycosylated hemoglobin (Hb A1C)  Result Value Ref Range   Hemoglobin A1C 6.2 (A) 4.0 - 5.6 %    HbA1c POC (<> result, manual entry)     HbA1c, POC (prediabetic range)     HbA1c, POC (controlled diabetic range)  This visit occurred during the SARS-CoV-2 public health emergency.  Safety protocols were in place, including screening questions prior to the visit, additional usage of staff PPE, and extensive cleaning of exam room while observing appropriate contact time as indicated for disinfecting solutions.   COVID 19 screen:  No recent travel or known exposure to COVID19 The patient denies respiratory symptoms of COVID 19 at this time. The importance of social distancing was discussed today.   Assessment and Plan Problem List Items Addressed This Visit     Tick bite of abdominal wall - Primary    No local infection. No symptoms of tick borne illness  And out of time range for symptoms.  Treat post inflammatory changes with topical steroid cream.            Eliezer Lofts, MD

## 2020-07-26 NOTE — Patient Instructions (Signed)
Apply topical steroid cream to bite twice daily.

## 2020-07-27 ENCOUNTER — Other Ambulatory Visit: Payer: Self-pay | Admitting: Primary Care

## 2020-07-27 DIAGNOSIS — E119 Type 2 diabetes mellitus without complications: Secondary | ICD-10-CM

## 2020-07-27 DIAGNOSIS — I10 Essential (primary) hypertension: Secondary | ICD-10-CM

## 2020-07-27 DIAGNOSIS — S70361A Insect bite (nonvenomous), right thigh, initial encounter: Secondary | ICD-10-CM | POA: Diagnosis not present

## 2020-07-27 DIAGNOSIS — E785 Hyperlipidemia, unspecified: Secondary | ICD-10-CM

## 2020-07-27 DIAGNOSIS — N1832 Chronic kidney disease, stage 3b: Secondary | ICD-10-CM

## 2020-07-27 DIAGNOSIS — W57XXXA Bitten or stung by nonvenomous insect and other nonvenomous arthropods, initial encounter: Secondary | ICD-10-CM | POA: Diagnosis not present

## 2020-08-03 ENCOUNTER — Other Ambulatory Visit: Payer: Self-pay | Admitting: Primary Care

## 2020-08-03 ENCOUNTER — Other Ambulatory Visit: Payer: Self-pay | Admitting: Internal Medicine

## 2020-08-03 DIAGNOSIS — Z1231 Encounter for screening mammogram for malignant neoplasm of breast: Secondary | ICD-10-CM

## 2020-08-10 ENCOUNTER — Other Ambulatory Visit: Payer: Self-pay

## 2020-08-10 ENCOUNTER — Other Ambulatory Visit (INDEPENDENT_AMBULATORY_CARE_PROVIDER_SITE_OTHER): Payer: Medicare PPO

## 2020-08-10 DIAGNOSIS — N1832 Chronic kidney disease, stage 3b: Secondary | ICD-10-CM | POA: Diagnosis not present

## 2020-08-10 DIAGNOSIS — E785 Hyperlipidemia, unspecified: Secondary | ICD-10-CM

## 2020-08-10 DIAGNOSIS — I10 Essential (primary) hypertension: Secondary | ICD-10-CM | POA: Diagnosis not present

## 2020-08-10 DIAGNOSIS — E119 Type 2 diabetes mellitus without complications: Secondary | ICD-10-CM | POA: Diagnosis not present

## 2020-08-10 LAB — COMPREHENSIVE METABOLIC PANEL
ALT: 23 U/L (ref 0–35)
AST: 26 U/L (ref 0–37)
Albumin: 3.8 g/dL (ref 3.5–5.2)
Alkaline Phosphatase: 77 U/L (ref 39–117)
BUN: 22 mg/dL (ref 6–23)
CO2: 26 mEq/L (ref 19–32)
Calcium: 9.4 mg/dL (ref 8.4–10.5)
Chloride: 105 mEq/L (ref 96–112)
Creatinine, Ser: 1.24 mg/dL — ABNORMAL HIGH (ref 0.40–1.20)
GFR: 38.77 mL/min — ABNORMAL LOW (ref >60.00)
Glucose, Bld: 145 mg/dL — ABNORMAL HIGH (ref 70–99)
Potassium: 4.2 mEq/L (ref 3.5–5.1)
Sodium: 139 mEq/L (ref 135–145)
Total Bilirubin: 0.6 mg/dL (ref 0.2–1.2)
Total Protein: 6.2 g/dL (ref 6.0–8.3)

## 2020-08-10 LAB — CBC
HCT: 37.7 % (ref 36.0–46.0)
Hemoglobin: 12.5 g/dL (ref 12.0–15.0)
MCHC: 33.2 g/dL (ref 30.0–36.0)
MCV: 93.1 fl (ref 78.0–100.0)
Platelets: 227 10*3/uL (ref 150.0–400.0)
RBC: 4.05 Mil/uL (ref 3.87–5.11)
RDW: 14.5 % (ref 11.5–15.5)
WBC: 4.9 10*3/uL (ref 4.0–10.5)

## 2020-08-10 LAB — LIPID PANEL
Cholesterol: 154 mg/dL (ref 0–200)
HDL: 63 mg/dL (ref >39.00)
LDL Cholesterol: 74 mg/dL (ref 0–99)
NonHDL: 91.21
Total CHOL/HDL Ratio: 2
Triglycerides: 84 mg/dL (ref 0.0–149.0)
VLDL: 16.8 mg/dL (ref 0.0–40.0)

## 2020-08-10 LAB — HEMOGLOBIN A1C: Hgb A1c MFr Bld: 6.4 % (ref 4.6–6.5)

## 2020-08-16 ENCOUNTER — Other Ambulatory Visit: Payer: Self-pay | Admitting: Family Medicine

## 2020-08-16 DIAGNOSIS — I10 Essential (primary) hypertension: Secondary | ICD-10-CM

## 2020-08-17 ENCOUNTER — Encounter: Payer: Self-pay | Admitting: Primary Care

## 2020-08-17 ENCOUNTER — Other Ambulatory Visit: Payer: Self-pay

## 2020-08-17 ENCOUNTER — Ambulatory Visit (INDEPENDENT_AMBULATORY_CARE_PROVIDER_SITE_OTHER): Payer: Medicare PPO | Admitting: Primary Care

## 2020-08-17 VITALS — BP 138/70 | HR 93 | Temp 97.9°F | Ht 61.0 in | Wt 139.0 lb

## 2020-08-17 DIAGNOSIS — N1832 Chronic kidney disease, stage 3b: Secondary | ICD-10-CM

## 2020-08-17 DIAGNOSIS — I10 Essential (primary) hypertension: Secondary | ICD-10-CM

## 2020-08-17 DIAGNOSIS — F4323 Adjustment disorder with mixed anxiety and depressed mood: Secondary | ICD-10-CM | POA: Diagnosis not present

## 2020-08-17 DIAGNOSIS — K5900 Constipation, unspecified: Secondary | ICD-10-CM

## 2020-08-17 DIAGNOSIS — I2510 Atherosclerotic heart disease of native coronary artery without angina pectoris: Secondary | ICD-10-CM | POA: Diagnosis not present

## 2020-08-17 DIAGNOSIS — E2839 Other primary ovarian failure: Secondary | ICD-10-CM | POA: Diagnosis not present

## 2020-08-17 DIAGNOSIS — Z0000 Encounter for general adult medical examination without abnormal findings: Secondary | ICD-10-CM | POA: Diagnosis not present

## 2020-08-17 DIAGNOSIS — M1611 Unilateral primary osteoarthritis, right hip: Secondary | ICD-10-CM

## 2020-08-17 NOTE — Assessment & Plan Note (Signed)
Asymptomatic. Continue aspirin 81 mg, BP control, statin therapy.

## 2020-08-17 NOTE — Assessment & Plan Note (Signed)
Immunizations UTD. Mammogram scheduled, will add bone density scan. Colonoscopy NA given age.  Encouraged regular activity, healthy diet. Exam today stable. Labs reviewed.

## 2020-08-17 NOTE — Assessment & Plan Note (Signed)
Chronic, continues to follow with orthopedics, overall doing well.

## 2020-08-17 NOTE — Patient Instructions (Signed)
It was a pleasure to see you today!  Preventive Care 85 Years and Older, Female Preventive care refers to lifestyle choices and visits with your health care provider that can promote health and wellness. This includes: A yearly physical exam. This is also called an annual wellness visit. Regular dental and eye exams. Immunizations. Screening for certain conditions. Healthy lifestyle choices, such as: Eating a healthy diet. Getting regular exercise. Not using drugs or products that contain nicotine and tobacco. Limiting alcohol use. What can I expect for my preventive care visit? Physical exam Your health care provider will check your: Height and weight. These may be used to calculate your BMI (body mass index). BMI is a measurement that tells if you are at a healthy weight. Heart rate and blood pressure. Body temperature. Skin for abnormal spots. Counseling Your health care provider may ask you questions about your: Past medical problems. Family's medical history. Alcohol, tobacco, and drug use. Emotional well-being. Home life and relationship well-being. Sexual activity. Diet, exercise, and sleep habits. History of falls. Memory and ability to understand (cognition). Work and work Statistician. Pregnancy and menstrual history. Access to firearms. What immunizations do I need?  Vaccines are usually given at various ages, according to a schedule. Your health care provider will recommend vaccines for you based on your age, medicalhistory, and lifestyle or other factors, such as travel or where you work. What tests do I need? Blood tests Lipid and cholesterol levels. These may be checked every 5 years, or more often depending on your overall health. Hepatitis C test. Hepatitis B test. Screening Lung cancer screening. You may have this screening every year starting at age 85 if you have a 30-pack-year history of smoking and currently smoke or have quit within the past 15  years. Colorectal cancer screening. All adults should have this screening starting at age 85 and continuing until age 66. Your health care provider may recommend screening at age 85 if you are at increased risk. You will have tests every 1-10 years, depending on your results and the type of screening test. Diabetes screening. This is done by checking your blood sugar (glucose) after you have not eaten for a while (fasting). You may have this done every 1-3 years. Mammogram. This may be done every 1-2 years. Talk with your health care provider about how often you should have regular mammograms. Abdominal aortic aneurysm (AAA) screening. You may need this if you are a current or former smoker. BRCA-related cancer screening. This may be done if you have a family history of breast, ovarian, tubal, or peritoneal cancers. Other tests STD (sexually transmitted disease) testing, if you are at risk. Bone density scan. This is done to screen for osteoporosis. You may have this done starting at age 85. Talk with your health care provider about your test results, treatment options,and if necessary, the need for more tests. Follow these instructions at home: Eating and drinking  Eat a diet that includes fresh fruits and vegetables, whole grains, lean protein, and low-fat dairy products. Limit your intake of foods with high amounts of sugar, saturated fats, and salt. Take vitamin and mineral supplements as recommended by your health care provider. Do not drink alcohol if your health care provider tells you not to drink. If you drink alcohol: Limit how much you have to 0-1 drink a day. Be aware of how much alcohol is in your drink. In the [place] one drink equals one 12 oz bottle of beer (355 mL), one 5  oz glass of wine (148 mL), or one 1 oz glass of hard liquor (44 mL).  Lifestyle Take daily care of your teeth and gums. Brush your teeth every morning and night with fluoride toothpaste. Floss one time  each day. Stay active. Exercise for at least 30 minutes 5 or more days each week. Do not use any products that contain nicotine or tobacco, such as cigarettes, e-cigarettes, and chewing tobacco. If you need help quitting, ask your health care provider. Do not use drugs. If you are sexually active, practice safe sex. Use a condom or other form of protection in order to prevent STIs (sexually transmitted infections). Talk with your health care provider about taking a low-dose aspirin or statin. Find healthy ways to cope with stress, such as: Meditation, yoga, or listening to music. Journaling. Talking to a trusted person. Spending time with friends and family. Safety Always wear your seat belt while driving or riding in a vehicle. Do not drive: If you have been drinking alcohol. Do not ride with someone who has been drinking. When you are tired or distracted. While texting. Wear a helmet and other protective equipment during sports activities. If you have firearms in your house, make sure you follow all gun safety procedures. What's next? Visit your health care provider once a year for an annual wellness visit. Ask your health care provider how often you should have your eyes and teeth checked. Stay up to date on all vaccines. This information is not intended to replace advice given to you by your health care provider. Make sure you discuss any questions you have with your healthcare provider. Document Revised: 01/20/2020 Document Reviewed: 01/23/2018 Elsevier Patient Education  2022 [employer].

## 2020-08-17 NOTE — Assessment & Plan Note (Signed)
Intermittent. Encouraged fiber intake, daily water intake, walking.  Denies concerns today.

## 2020-08-17 NOTE — Assessment & Plan Note (Signed)
Stable today. Continue amlodipine 5 mg and losartan 50 mg.

## 2020-08-17 NOTE — Assessment & Plan Note (Signed)
Following with nephrology.  Continue losartan 50 mg and BP control.

## 2020-08-17 NOTE — Progress Notes (Signed)
Subjective:    Patient ID: [name], female    DOB: [birth year], 87 y.o.   MRN: [id number]  HPI  [name] is a very pleasant 87 y.o. female who presents today for complete physical.  Immunizations: -Influenza: Due this season  -Covid-19: 4 vaccines -Shingles: Shingrix -Pneumonia: 2017 and 2018   Diet: [place].  Exercise: No regular exercise. She walks daily.   Eye exam: No recent visit.  Dental exam: No recent visit   Mammogram: Completed in May 2021, scheduled for August 2022 Dexa: Completed in 2020, due  BP Readings from Last 3 Encounters:  08/17/20 138/70  07/26/20 90/70  01/11/20 122/60         Review of Systems  Constitutional:  Negative for unexpected weight change.  HENT:  Negative for rhinorrhea.   Eyes:  Negative for visual disturbance.  Respiratory:  Negative for shortness of breath.   Cardiovascular:  Negative for chest pain.  Gastrointestinal:  Negative for constipation and diarrhea.  Genitourinary:  Negative for difficulty urinating.  Musculoskeletal:  Positive for arthralgias.  Skin:  Negative for rash.  Allergic/Immunologic: Negative for environmental allergies.  Neurological:  Negative for dizziness and headaches.  Psychiatric/Behavioral:  The patient is not nervous/anxious.         Past Medical History:  Diagnosis Date   Arthritis    Breast cancer ([place]) 2001   left    CAD (coronary artery disease)    Asymptomatic   Diabetes mellitus ([place])    Diverticulosis    Hiatal hernia    History of shingles    ON BACK   Hyperlipidemia    Hypertension    Kidney stone    PVC (premature ventricular contraction)    Vertigo     Social History   Socioeconomic History   Marital status: Widowed    Spouse name: Not on file   Number of children: Not on file   Years of education: Not on file   Highest education level: Not on file  Occupational History   Not on file  Tobacco Use   Smoking status: Never   Smokeless  tobacco: Never  Vaping Use   Vaping Use: Never used  Substance and Sexual Activity   Alcohol use: No   Drug use: No   Sexual activity: Not Currently  Other Topics Concern   Not on file  Social History Narrative   Widow.    Lives by herself at home.   Volunteers at [facility] [employer].   Enjoys volunteering, helping her neighbors, spending time with family, church.   Social Determinants of Health   Financial Resource Strain: Not on file  Food Insecurity: Not on file  Transportation Needs: Not on file  Physical Activity: Not on file  Stress: Not on file  Social Connections: Not on file  Intimate Partner Violence: Not on file    Past Surgical History:  Procedure Laterality Date   BREAST LUMPECTOMY Left 2001   BREAST LUMPECTOMY WITH AXILLARY LYMPH NODE DISSECTION  2003   left   CARDIAC CATHETERIZATION  07/13/2009   Recommendation - medical therapy   CARDIOVASCULAR STRESS TEST  06/16/2009   Mild ischemia demonstrated in the basal inferior, mid inferior, and apical inferior regions.   CATARACTS REMOVED     CHOLECYSTECTOMY     JOINT REPLACEMENT     BILATERAL TOTAL KNEES   KNEE ARTHROPLASTY Right 2009   TOTAL HIP ARTHROPLASTY Left 11/03/2014   Procedure: LEFT TOTAL HIP ARTHROPLASTY ANTERIOR APPROACH;  Surgeon: Pilar Plate  Aluisio, MD;  Location: [facility];  Service: Orthopedics;  Laterality: Left;   TRANSTHORACIC ECHOCARDIOGRAM  06/16/2009   EF >55%, no significant valvular abnormalities.    Family History  Problem Relation Age of Onset   Heart attack Mother    Cancer Mother    Stroke Father    Stroke Brother    Stroke Brother     No Known Allergies  Current Outpatient Medications on File Prior to Visit  Medication Sig Dispense Refill   Accu-Chek FastClix Lancets MISC CHECK BLOOD SUGAR 1 TO 2 TIMES DAILY ANDAS DIRECTED 102 each 3   ACCU-CHEK SMARTVIEW test strip CHECK BLOOD SUGAR 1 TO 2 TIMES DAILY ANDAS DIRECTED 100 each 3   amLODipine (NORVASC) 5 MG tablet Take 5 mg by mouth daily.      aspirin 81 MG tablet Take 81 mg by mouth daily.     atorvastatin (LIPITOR) 10 MG tablet TAKE 1 TABLET BY MOUTH ONCE DAILY EVERY EVENING FOR CHOLESTEROL 90 tablet 1   Blood Glucose Monitoring Suppl (ACCU-CHEK AVIVA PLUS) w/Device KIT Check blood sugar 1 - 2 times daily and as directed. E11.9 1 kit 0   losartan (COZAAR) 50 MG tablet TAKE 1 TABLET BY MOUTH ONCE DAILY FOR BLOOD PRESSURE 90 tablet 0   sertraline (ZOLOFT) 25 MG tablet Take 1 tablet (25 mg total) by mouth daily. For anxiety. 90 tablet 3   triamcinolone cream (KENALOG) 0.1 % Apply 1 application topically 2 (two) times daily. 30 g 0   No current facility-administered medications on file prior to visit.    BP 138/70   Pulse 93   Temp 97.9 F (36.6 C) (Temporal)   Ht _0  (1.549 m)   Wt 139 lb (63 kg)   SpO2 98%   BMI 26.26 kg/m  Objective:   Physical Exam HENT:     Right Ear: Tympanic membrane and ear canal normal.     Left Ear: Tympanic membrane and ear canal normal.     Nose: Nose normal.  Eyes:     Conjunctiva/sclera: Conjunctivae normal.     Pupils: Pupils are equal, round, and reactive to light.  Neck:     Thyroid: No thyromegaly.  Cardiovascular:     Rate and Rhythm: Normal rate and regular rhythm.     Heart sounds: No murmur heard. Pulmonary:     Effort: Pulmonary effort is normal.     Breath sounds: Normal breath sounds. No rales.  Abdominal:     General: Bowel sounds are normal.     Palpations: Abdomen is soft.     Tenderness: There is no abdominal tenderness.  Musculoskeletal:        General: Normal range of motion.     Cervical back: Neck supple.  Lymphadenopathy:     Cervical: No cervical adenopathy.  Skin:    General: Skin is warm and dry.     Findings: No rash.  Neurological:     Mental Status: She is alert and oriented to person, place, and time.     Cranial Nerves: No cranial nerve deficit.     Deep Tendon Reflexes: Reflexes are normal and symmetric.  Psychiatric:        Mood and Affect:  Mood normal.          Assessment & Plan:      This visit occurred during the SARS-CoV-2 public health emergency.  Safety protocols were in place, including screening questions prior to the visit, additional usage of staff PPE, and  extensive cleaning of exam room while observing appropriate contact time as indicated for disinfecting solutions.

## 2020-08-17 NOTE — Assessment & Plan Note (Signed)
Overall appears stable. Continue sertraline 25 mg daily.

## 2020-09-05 ENCOUNTER — Telehealth: Payer: Self-pay

## 2020-09-05 NOTE — Telephone Encounter (Signed)
Pt's daughter LVM reporting pt has runny nose, cough and she is not sure if it is allergies.

## 2020-09-05 NOTE — Telephone Encounter (Signed)
Tried to call patient's daughter back and got her voicemail. Left a message on voicemail for patient's daughter to call the office back.

## 2020-09-06 ENCOUNTER — Telehealth (INDEPENDENT_AMBULATORY_CARE_PROVIDER_SITE_OTHER): Payer: Medicare PPO | Admitting: Family Medicine

## 2020-09-06 ENCOUNTER — Other Ambulatory Visit: Payer: Self-pay

## 2020-09-06 ENCOUNTER — Encounter: Payer: Self-pay | Admitting: Family Medicine

## 2020-09-06 VITALS — Temp 97.0°F | Ht 61.0 in | Wt 140.0 lb

## 2020-09-06 DIAGNOSIS — J069 Acute upper respiratory infection, unspecified: Secondary | ICD-10-CM

## 2020-09-06 MED ORDER — BENZONATATE 100 MG PO CAPS: 100.0000 mg | ORAL_CAPSULE | Freq: Three times a day (TID) | ORAL | 0 refills | Status: DC | PRN

## 2020-09-06 NOTE — Telephone Encounter (Signed)
Can add on pt today at 1140 if able to do virtual visit

## 2020-09-06 NOTE — Progress Notes (Signed)
I connected with [name] on 09/06/20 at 11:40 AM EDT by video and verified that I am speaking with the correct person using two identifiers.   I discussed the limitations, risks, security and privacy concerns of performing an evaluation and management service by video and the availability of in person appointments. I also discussed with the patient that there may be a patient responsible charge related to this service. The patient expressed understanding and agreed to proceed.  Patient location: Home Provider Location: [facility] Participants: [name] and [name] and Daughter [name]   Subjective:     [name] is a 87 y.o. female presenting for Nasal Congestion and Cough     Cough This is a new problem. The current episode started in the past 7 days. The problem has been unchanged. The cough is Productive of sputum. Associated symptoms include nasal congestion, postnasal drip and rhinorrhea. Pertinent negatives include no chills, fever, headaches, myalgias, sore throat, shortness of breath or wheezing. Associated symptoms comments: Throat congestion.   Sick contact: no Loss of taste or smell: no Eating and drinking well Sleeping ok  Treatment: no medications  Symptoms present for at least 1 week  Did a home covid test  Review of Systems  Constitutional:  Negative for chills and fever.  HENT:  Positive for postnasal drip and rhinorrhea. Negative for sore throat.   Respiratory:  Positive for cough. Negative for shortness of breath and wheezing.   Musculoskeletal:  Negative for myalgias.  Neurological:  Negative for headaches.    Social History   Tobacco Use  Smoking Status Never  Smokeless Tobacco Never        Objective:   BP Readings from Last 3 Encounters:  08/17/20 138/70  07/26/20 90/70  01/11/20 122/60   Wt Readings from Last 3 Encounters:  09/06/20 140 lb (63.5 kg)  08/17/20 139 lb (63 kg)  07/26/20  135 lb 8 oz (61.5 kg)   Temp (!) 97 F (36.1 C)   Ht '5\' 1"'$  (1.549 m)   Wt 140 lb (63.5 kg)   BMI 26.45 kg/m   Physical Exam Constitutional:      Appearance: Normal appearance. She is not ill-appearing.  HENT:     Head: Normocephalic and atraumatic.     Right Ear: External ear normal.     Left Ear: External ear normal.  Eyes:     Conjunctiva/sclera: Conjunctivae normal.  Pulmonary:     Effort: Pulmonary effort is normal. No respiratory distress.  Neurological:     Mental Status: She is alert. Mental status is at baseline.  Psychiatric:        Mood and Affect: Mood normal.        Behavior: Behavior normal.        Thought Content: Thought content normal.        Judgment: Judgment normal.           Assessment & Plan:   Problem List Items Addressed This Visit   None Visit Diagnoses     Viral URI with cough    -  Primary   Relevant Medications   benzonatate (TESSALON PERLES) 100 MG capsule      Negative home covid test and symptoms x 1 week so no indication for repeat/confirmation as tool late for antivirals  Advised saline rinse Symptomatic care Tessalon sent to pharmacy Return precautions - fevers/chills/worsening breathing  Return if symptoms worsen or fail to improve.  [name]  Kari Pheasant, MD

## 2020-09-06 NOTE — Telephone Encounter (Signed)
Spoke with daughter [name].  Dr. Einar Pheasant has opened up some slots today and we were able to get patient's appt moved up to today at 1140.  Daughter thanks [place] for our help.

## 2020-09-06 NOTE — Telephone Encounter (Signed)
Spoke to patient's daughter [name] and was advised that her mom seems to be a little better this morning. [name] stated that her mom started with symptoms late Sunday night or early yesterday morning. Patient's daughter stated that her mom has had a croupy cough, runny nose, headache and her throat feels funny. [name] denies that her mom has had a fever or SOB. [name] stated that she did a covid home test on her mom yesterday and it was negative. Patient scheduled for a virtual visit tomorrow 09/07/20 at 11:20 am with Dr. Einar Pheasant. Patient's daughter given ER precautions and she verbalized understanding.

## 2020-09-07 ENCOUNTER — Telehealth: Payer: Medicare PPO | Admitting: Family Medicine

## 2020-09-27 ENCOUNTER — Ambulatory Visit
Admission: RE | Admit: 2020-09-27 | Discharge: 2020-09-27 | Disposition: A | Discharge status: Home / Self Care | Payer: Medicare PPO | Source: Ambulatory Visit | Attending: Primary Care | Admitting: Primary Care

## 2020-09-27 ENCOUNTER — Other Ambulatory Visit: Payer: Self-pay

## 2020-09-27 DIAGNOSIS — Z1231 Encounter for screening mammogram for malignant neoplasm of breast: Secondary | ICD-10-CM | POA: Diagnosis not present

## 2020-10-20 ENCOUNTER — Other Ambulatory Visit: Payer: Self-pay | Admitting: Primary Care

## 2020-10-20 DIAGNOSIS — E785 Hyperlipidemia, unspecified: Secondary | ICD-10-CM

## 2020-12-02 ENCOUNTER — Encounter: Payer: Self-pay | Admitting: Emergency Medicine

## 2020-12-02 ENCOUNTER — Emergency Department: Payer: Medicare PPO

## 2020-12-02 ENCOUNTER — Emergency Department
Admission: EM | Admit: 2020-12-02 | Discharge: 2020-12-02 | Disposition: A | Discharge status: Home / Self Care | Payer: Medicare PPO | Attending: Emergency Medicine | Admitting: Emergency Medicine

## 2020-12-02 ENCOUNTER — Other Ambulatory Visit: Payer: Self-pay

## 2020-12-02 DIAGNOSIS — I10 Essential (primary) hypertension: Secondary | ICD-10-CM | POA: Diagnosis not present

## 2020-12-02 DIAGNOSIS — Z7982 Long term (current) use of aspirin: Secondary | ICD-10-CM | POA: Insufficient documentation

## 2020-12-02 DIAGNOSIS — Z96642 Presence of left artificial hip joint: Secondary | ICD-10-CM | POA: Diagnosis not present

## 2020-12-02 DIAGNOSIS — R519 Headache, unspecified: Secondary | ICD-10-CM | POA: Diagnosis not present

## 2020-12-02 DIAGNOSIS — N183 Chronic kidney disease, stage 3 unspecified: Secondary | ICD-10-CM | POA: Diagnosis not present

## 2020-12-02 DIAGNOSIS — W19XXXA Unspecified fall, initial encounter: Secondary | ICD-10-CM | POA: Diagnosis not present

## 2020-12-02 DIAGNOSIS — Z96653 Presence of artificial knee joint, bilateral: Secondary | ICD-10-CM | POA: Insufficient documentation

## 2020-12-02 DIAGNOSIS — E119 Type 2 diabetes mellitus without complications: Secondary | ICD-10-CM | POA: Insufficient documentation

## 2020-12-02 DIAGNOSIS — Z853 Personal history of malignant neoplasm of breast: Secondary | ICD-10-CM | POA: Diagnosis not present

## 2020-12-02 DIAGNOSIS — Z79899 Other long term (current) drug therapy: Secondary | ICD-10-CM | POA: Insufficient documentation

## 2020-12-02 DIAGNOSIS — S0990XA Unspecified injury of head, initial encounter: Secondary | ICD-10-CM | POA: Diagnosis not present

## 2020-12-02 DIAGNOSIS — W01198A Fall on same level from slipping, tripping and stumbling with subsequent striking against other object, initial encounter: Secondary | ICD-10-CM | POA: Diagnosis not present

## 2020-12-02 DIAGNOSIS — M25572 Pain in left ankle and joints of left foot: Secondary | ICD-10-CM | POA: Diagnosis not present

## 2020-12-02 DIAGNOSIS — I6782 Cerebral ischemia: Secondary | ICD-10-CM | POA: Diagnosis not present

## 2020-12-02 DIAGNOSIS — I129 Hypertensive chronic kidney disease with stage 1 through stage 4 chronic kidney disease, or unspecified chronic kidney disease: Secondary | ICD-10-CM | POA: Diagnosis not present

## 2020-12-02 DIAGNOSIS — I2510 Atherosclerotic heart disease of native coronary artery without angina pectoris: Secondary | ICD-10-CM | POA: Diagnosis not present

## 2020-12-02 DIAGNOSIS — M25551 Pain in right hip: Secondary | ICD-10-CM | POA: Diagnosis not present

## 2020-12-02 DIAGNOSIS — R52 Pain, unspecified: Secondary | ICD-10-CM | POA: Diagnosis not present

## 2020-12-02 LAB — COMPREHENSIVE METABOLIC PANEL
ALT: 65 U/L — ABNORMAL HIGH (ref 0–44)
AST: 119 U/L — ABNORMAL HIGH (ref 15–41)
Albumin: 4.1 g/dL (ref 3.5–5.0)
Alkaline Phosphatase: 124 U/L (ref 38–126)
Anion gap: 12 (ref 5–15)
BUN: 17 mg/dL (ref 8–23)
CO2: 24 mmol/L (ref 22–32)
Calcium: 9.4 mg/dL (ref 8.9–10.3)
Chloride: 103 mmol/L (ref 98–111)
Creatinine, Ser: 1 mg/dL (ref 0.44–1.00)
GFR, Estimated: 54 mL/min — ABNORMAL LOW (ref >60)
Glucose, Bld: 161 mg/dL — ABNORMAL HIGH (ref 70–99)
Potassium: 4.1 mmol/L (ref 3.5–5.1)
Sodium: 139 mmol/L (ref 135–145)
Total Bilirubin: 1.2 mg/dL (ref 0.3–1.2)
Total Protein: 6.9 g/dL (ref 6.5–8.1)

## 2020-12-02 LAB — CBC
HCT: 36.3 % (ref 36.0–46.0)
Hemoglobin: 12.6 g/dL (ref 12.0–15.0)
MCH: 32.1 pg (ref 26.0–34.0)
MCHC: 34.7 g/dL (ref 30.0–36.0)
MCV: 92.6 fL (ref 80.0–100.0)
Platelets: 192 10*3/uL (ref 150–400)
RBC: 3.92 MIL/uL (ref 3.87–5.11)
RDW: 12.8 % (ref 11.5–15.5)
WBC: 10.4 10*3/uL (ref 4.0–10.5)
nRBC: 0 % (ref 0.0–0.2)

## 2020-12-02 LAB — CK: Total CK: 428 U/L — ABNORMAL HIGH (ref 38–234)

## 2020-12-02 MED ORDER — ONDANSETRON HCL 4 MG/2ML IJ SOLN
4.0000 mg | Freq: Once | INTRAMUSCULAR | Status: AC
  Administered 2020-12-02: 4 mg via INTRAVENOUS
  Filled 2020-12-02: qty 2

## 2020-12-02 NOTE — ED Notes (Signed)
Dc paperwork provided. Pt assisted with dressing and assisted to [place] via wheelchair

## 2020-12-02 NOTE — ED Notes (Signed)
Pt verbalized understanding and verbal agree for dc wth family at bedside.

## 2020-12-02 NOTE — ED Provider Notes (Signed)
[facility] Emergency Department Provider Note   ____________________________________________    I have reviewed the triage vital signs and the nursing notes.   HISTORY  Chief Complaint Fall     HPI [name] is a 87 y.o. female with a history including diabetes and as below who presents after a fall.  Patient reports she fell last night, she is not entirely sure why but was unable to get up on her own.  She denies chest pain or back pain.  Is not sure if she hit her head.  No fevers or chills.  No abdominal pain.  No new medications.  Feels well currently.  No hip no lower extremity is able to move her lower extremities without pain  Past Medical History:  Diagnosis Date   Arthritis    Breast cancer ([place]) 2001   left    CAD (coronary artery disease)    Asymptomatic   Diabetes mellitus ([place])    Diverticulosis    Hiatal hernia    History of shingles    ON BACK   Hyperlipidemia    Hypertension    Kidney stone    PVC (premature ventricular contraction)    Vertigo     Patient Active Problem List   Diagnosis Date Noted   Tick bite of abdominal wall 07/26/2020   Renal stone 12/17/2018   Adjustment disorder with mixed anxiety and depressed mood 11/24/2018   Constipation 11/24/2018   Preventative health care 08/08/2018   Stage 3 chronic kidney disease ([place]) 02/19/2018   Medicare annual wellness visit, subsequent 06/30/2015   OA (osteoarthritis) of hip 11/03/2014   Ventricular bigeminy 12/16/2012   CAD (coronary artery disease) 12/16/2012   HTN (hypertension) 12/16/2012   Hyperlipidemia 12/16/2012   Prediabetes 12/16/2012   Dizziness of unknown cause 12/16/2012    Past Surgical History:  Procedure Laterality Date   BREAST LUMPECTOMY Left 2001   BREAST LUMPECTOMY WITH AXILLARY LYMPH NODE DISSECTION  2003   left   CARDIAC CATHETERIZATION  07/13/2009   Recommendation - medical therapy   CARDIOVASCULAR STRESS TEST  06/16/2009    Mild ischemia demonstrated in the basal inferior, mid inferior, and apical inferior regions.   CATARACTS REMOVED     CHOLECYSTECTOMY     JOINT REPLACEMENT     BILATERAL TOTAL KNEES   KNEE ARTHROPLASTY Right 2009   TOTAL HIP ARTHROPLASTY Left 11/03/2014   Procedure: LEFT TOTAL HIP ARTHROPLASTY ANTERIOR APPROACH;  Surgeon: Gaynelle Arabian, MD;  Location: [facility];  Service: Orthopedics;  Laterality: Left;   TRANSTHORACIC ECHOCARDIOGRAM  06/16/2009   EF >55%, no significant valvular abnormalities.    Prior to Admission medications   Medication Sig Start Date End Date Taking? Authorizing Provider  Accu-Chek FastClix Lancets MISC CHECK BLOOD SUGAR 1 TO 2 TIMES DAILY ANDAS DIRECTED 04/05/20   Pleas Koch, NP  ACCU-CHEK SMARTVIEW test strip CHECK BLOOD SUGAR 1 TO 2 TIMES DAILY ANDAS DIRECTED 04/05/20   Pleas Koch, NP  amLODipine (NORVASC) 5 MG tablet Take 5 mg by mouth daily.    [provider]  aspirin 81 MG tablet Take 81 mg by mouth daily.    [provider]  atorvastatin (LIPITOR) 10 MG tablet TAKE 1 TABLET BY MOUTH ONCE DAILY EVERY EVENING FOR CHOLESTEROL 10/21/20   Pleas Koch, NP  benzonatate (TESSALON PERLES) 100 MG capsule Take 1 capsule (100 mg total) by mouth 3 (three) times daily as needed. 09/06/20   Lesleigh Noe, MD  Blood Glucose Monitoring Suppl (ACCU-CHEK AVIVA PLUS) w/Device KIT Check blood sugar 1 - 2 times daily and as directed. E11.9 04/03/19   Pleas Koch, NP  losartan (COZAAR) 50 MG tablet TAKE 1 TABLET BY MOUTH ONCE DAILY FOR BLOOD PRESSURE 08/19/20   Pleas Koch, NP  sertraline (ZOLOFT) 25 MG tablet Take 1 tablet (25 mg total) by mouth daily. For anxiety. 01/11/20   Pleas Koch, NP  triamcinolone cream (KENALOG) 0.1 % Apply 1 application topically 2 (two) times daily. 07/26/20   Jinny Sanders, MD     Allergies Patient has no known allergies.  Family History  Problem Relation Age of Onset   Heart attack Mother    Cancer  Mother    Stroke Father    Stroke Brother    Stroke Brother     Social History Social History   Tobacco Use   Smoking status: Never   Smokeless tobacco: Never  Vaping Use   Vaping Use: Never used  Substance Use Topics   Alcohol use: No   Drug use: No    Review of Systems  Constitutional: No fever/chills Eyes: No visual changes.  ENT: No sore throat. Cardiovascular: Denies chest pain. Respiratory: Denies shortness of breath. Gastrointestinal: No abdominal pain.  No nausea, no vomiting.   Genitourinary: Negative for dysuria. Musculoskeletal: Negative for back pain. Skin: Negative for rash. Neurological: Negative for headaches    ____________________________________________   PHYSICAL EXAM:  VITAL SIGNS: ED Triage Vitals  Enc Vitals Group     BP 12/02/20 1157 (!) 160/68     Pulse Rate 12/02/20 1157 97     Resp 12/02/20 1157 20     Temp 12/02/20 1157 97.9 F (36.6 C)     Temp Source 12/02/20 1157 Oral     SpO2 12/02/20 1157 100 %     Weight 12/02/20 1159 63.5 kg (139 lb 15.9 oz)     Height 12/02/20 1159 1.549 m (_0 )     Head Circumference --      Peak Flow --      Pain Score 12/02/20 1158 5     Pain Loc --      Pain Edu? --      Excl. in [place]? --     Constitutional: Alert and oriented. No acute distress. Pleasant and interactive Eyes: Conjunctivae are normal.  Head: Atraumatic. Nose: No congestion/rhinnorhea. Mouth/Throat: Mucous membranes are moist.    Cardiovascular: Normal rate, regular rhythm. Grossly normal heart sounds.  Good peripheral circulation. Respiratory: Normal respiratory effort.  No retractions. Lungs CTAB. Gastrointestinal: Soft and nontender. No distention.  No CVA tenderness.  Musculoskeletal: No pain with axial load bilaterally, full range of motion of all extremities.  No vertebral tenderness palpation Neurologic:  Normal speech and language. No gross focal neurologic deficits are appreciated.  Skin:  Skin is warm, dry and  intact. No rash noted. Psychiatric: Mood and affect are normal. Speech and behavior are normal.  ____________________________________________   LABS (all labs ordered are listed, but only abnormal results are displayed)  Labs Reviewed  COMPREHENSIVE METABOLIC PANEL - Abnormal; Notable for the following components:      Result Value   Glucose, Bld 161 (*)    AST 119 (*)    ALT 65 (*)    GFR, Estimated 54 (*)    All other components within normal limits  CK - Abnormal; Notable for the following components:   Total CK 428 (*)    All other  components within normal limits  CBC   ____________________________________________  EKG   ____________________________________________  RADIOLOGY  Hip x-ray reviewed by me, no fracture CT head unremarkable ____________________________________________   PROCEDURES  Procedure(s) performed: No  Procedures   Critical Care performed: No ____________________________________________   INITIAL IMPRESSION / ASSESSMENT AND PLAN / ED COURSE  Pertinent labs & imaging results that were available during my care of the patient were reviewed by me and considered in my medical decision making (see chart for details).   Patient presents after fall as described above.  She was unable to get up on her own without assistance.  Her CK is only mildly elevated.  She was treated with IV fluids.  White blood cell count is normal.  Kidney function is reassuring.  Glucose is normal.  CT head and pelvic x-ray are unremarkable, no evidence of acute injury.  She is feeling well currently, appropriate for discharge with outpatient follow-up, return precautions discussed    ____________________________________________   FINAL CLINICAL IMPRESSION(S) / ED DIAGNOSES  Final diagnoses:  Fall, initial encounter  Fall in home, initial encounter        Note:  This document was prepared using Systems analyst and may include unintentional  dictation errors.    Lavonia Drafts, MD 12/02/20 [phone]

## 2020-12-02 NOTE — ED Triage Notes (Signed)
Pt to ED via EMS from Home with c/o a fall. She tripped over her own feet. Denies any LOC or being on blood thinners.  She was unable to get up on her own and laid on the floor all night. She is complaining of a headache and right hip pain. No deformities or shortening noted. She does have several contusions on her body. She is A&O x4

## 2020-12-05 ENCOUNTER — Telehealth: Payer: Self-pay | Admitting: Primary Care

## 2020-12-05 NOTE — Telephone Encounter (Signed)
Please call patient to triage.

## 2020-12-05 NOTE — Telephone Encounter (Signed)
She can take tylenol 1000 mg up to 3 times a day if needed.   Would recommend avoiding ibuprofen and aleve.

## 2020-12-05 NOTE — Telephone Encounter (Signed)
Mail box full not able to leave message.

## 2020-12-05 NOTE — Telephone Encounter (Signed)
Spoke to patient's daughter [name] and was advised that her mom is complaining of right upper thigh pain since  her fall.  [name] stated that her mom states that it is about a pain level #5. [name] stated that her mom is able to walk with her cane. [name] stated that the ER doctor did  not give her mom anything for pain and wants to know what she should take. Pharmacy [facility]

## 2020-12-05 NOTE — Telephone Encounter (Signed)
Pt daughter called stating that pt fell Thursday morning 12/01/20 and was taken to the ER. Pt daughter states that nothing was broken,but pt has been having trouble with her hip and is having pain. Pt daughter asking what would be good to take for pain. Please advise.

## 2020-12-07 NOTE — Telephone Encounter (Signed)
Mailbox full

## 2020-12-09 NOTE — Telephone Encounter (Signed)
Letter mailed as requested

## 2020-12-09 NOTE — Telephone Encounter (Signed)
Have called all numbers mail box was full on one number, no answer no v/m on other and l/m on daughters cell to call office. This it third attempt to contact ok to mail letter to call office.?

## 2020-12-09 NOTE — Telephone Encounter (Signed)
Okay to send letter.

## 2020-12-18 NOTE — Progress Notes (Deleted)
Subjective:   [name] is a 87 y.o. female who presents for Medicare Annual (Subsequent) preventive examination.  I connected with Emeli Pagel today by telephone and verified that I am speaking with the correct person using two identifiers. Location patient: home Location provider: work Persons participating in the virtual visit: patient, Marine scientist.    I discussed the limitations, risks, security and privacy concerns of performing an evaluation and management service by telephone and the availability of in person appointments. I also discussed with the patient that there may be a patient responsible charge related to this service. The patient expressed understanding and verbally consented to this telephonic visit.    Interactive audio and video telecommunications were attempted between this provider and patient, however failed, due to patient having technical difficulties OR patient did not have access to video capability.  We continued and completed visit with audio only.  Some vital signs may be absent or patient reported.   Time Spent with patient on telephone encounter: *** minutes  Review of Systems           Objective:    There were no vitals filed for this visit. There is no height or weight on file to calculate BMI.  Advanced Directives 12/02/2020 12/14/2018 08/05/2018 07/19/2017 07/17/2016 11/03/2014 11/03/2014  Does Patient Have a Medical Advance Directive? No Yes No No Yes Yes Yes  Type of Advance Directive - Living will - - [place];Living will [place];Living will [place];Living will  Does patient want to make changes to medical advance directive? - No - Patient declined - - - - -  Copy of Healthcare Power of Attorney in Chart? - - - - No - copy requested No - copy requested No - copy requested  Would patient like information on creating a medical advance directive? - No - Patient declined No - Patient  declined Yes (MAU/Ambulatory/Procedural Areas - Information given) - - -    Current Medications (verified) Outpatient Encounter Medications as of 12/22/2020  Medication Sig   Accu-Chek FastClix Lancets MISC CHECK BLOOD SUGAR 1 TO 2 TIMES DAILY ANDAS DIRECTED   ACCU-CHEK SMARTVIEW test strip CHECK BLOOD SUGAR 1 TO 2 TIMES DAILY ANDAS DIRECTED   amLODipine (NORVASC) 5 MG tablet Take 5 mg by mouth daily.   aspirin 81 MG tablet Take 81 mg by mouth daily.   atorvastatin (LIPITOR) 10 MG tablet TAKE 1 TABLET BY MOUTH ONCE DAILY EVERY EVENING FOR CHOLESTEROL   benzonatate (TESSALON PERLES) 100 MG capsule Take 1 capsule (100 mg total) by mouth 3 (three) times daily as needed.   Blood Glucose Monitoring Suppl (ACCU-CHEK AVIVA PLUS) w/Device KIT Check blood sugar 1 - 2 times daily and as directed. E11.9   losartan (COZAAR) 50 MG tablet TAKE 1 TABLET BY MOUTH ONCE DAILY FOR BLOOD PRESSURE   sertraline (ZOLOFT) 25 MG tablet Take 1 tablet (25 mg total) by mouth daily. For anxiety.   triamcinolone cream (KENALOG) 0.1 % Apply 1 application topically 2 (two) times daily.   No facility-administered encounter medications on file as of 12/22/2020.    Allergies (verified) Patient has no known allergies.   History: Past Medical History:  Diagnosis Date   Arthritis    Breast cancer ([place]) 2001   left    CAD (coronary artery disease)    Asymptomatic   Diabetes mellitus ([place])    Diverticulosis    Hiatal hernia    History of shingles  ON BACK   Hyperlipidemia    Hypertension    Kidney stone    PVC (premature ventricular contraction)    Vertigo    Past Surgical History:  Procedure Laterality Date   BREAST LUMPECTOMY Left 2001   BREAST LUMPECTOMY WITH AXILLARY LYMPH NODE DISSECTION  2003   left   CARDIAC CATHETERIZATION  07/13/2009   Recommendation - medical therapy   CARDIOVASCULAR STRESS TEST  06/16/2009   Mild ischemia demonstrated in the basal inferior, mid inferior, and apical inferior  regions.   CATARACTS REMOVED     CHOLECYSTECTOMY     JOINT REPLACEMENT     BILATERAL TOTAL KNEES   KNEE ARTHROPLASTY Right 2009   TOTAL HIP ARTHROPLASTY Left 11/03/2014   Procedure: LEFT TOTAL HIP ARTHROPLASTY ANTERIOR APPROACH;  Surgeon: Gaynelle Arabian, MD;  Location: [facility];  Service: Orthopedics;  Laterality: Left;   TRANSTHORACIC ECHOCARDIOGRAM  06/16/2009   EF >55%, no significant valvular abnormalities.   Family History  Problem Relation Age of Onset   Heart attack Mother    Cancer Mother    Stroke Father    Stroke Brother    Stroke Brother    Social History   Socioeconomic History   Marital status: Widowed    Spouse name: Not on file   Number of children: Not on file   Years of education: Not on file   Highest education level: Not on file  Occupational History   Not on file  Tobacco Use   Smoking status: Never   Smokeless tobacco: Never  Vaping Use   Vaping Use: Never used  Substance and Sexual Activity   Alcohol use: No   Drug use: No   Sexual activity: Not Currently  Other Topics Concern   Not on file  Social History Narrative   Widow.    Lives by herself at home.   Volunteers at [employer] [employer].   Enjoys volunteering, helping her neighbors, spending time with family, church.   Social Determinants of Health   Financial Resource Strain: Not on file  Food Insecurity: Not on file  Transportation Needs: Not on file  Physical Activity: Not on file  Stress: Not on file  Social Connections: Not on file    Tobacco Counseling Counseling given: Not Answered   Clinical Intake:                 Diabetes:  Is the patient diabetic?  No  If diabetic, was a CBG obtained today?  No , visit completed over the phone. Did the patient bring in their glucometer from home?  No , visit completed over the phone. How often do you monitor your CBG's? ***.   Financial Strains and Diabetes Management:  Are you having any financial strains with the device, your supplies or  your medication? {YES/NO:21197}.  Does the patient want to be seen by Chronic Care Management for management of their diabetes?  {YES/NO:21197} Would the patient like to be referred to a Nutritionist or for Diabetic Management?  {YES/NO:21197}  Diabetic Exams:  Diabetic Eye Exam: Completed ***. Overdue for diabetic eye exam. Pt has been advised about the importance in completing this exam. A referral has been placed today. Message sent to referral coordinator for scheduling purposes. Advised pt to expect a call from our office re: appt.  Diabetic Foot Exam: Completed ***. Pt has been advised about the importance in completing this exam. Pt is scheduled for diabetic foot exam on ***.  Activities of Daily Living In your present state of health, do you have any difficulty performing the following activities: 01/11/2020  Hearing? N  Vision? N  Difficulty concentrating or making decisions? N  Walking or climbing stairs? N  Dressing or bathing? N  Doing errands, shopping? Y  Comment doesn't run errands alone  Some recent data might be hidden    Patient Care Team: Pleas Koch, NP as PCP - General (Internal Medicine)  Indicate any recent Medical Services you may have received from other than [facility] providers in the past year (date may be approximate).     Assessment:   This is a routine wellness examination for [place].  Hearing/Vision screen No results found.  Dietary issues and exercise activities discussed:     Goals Addressed   None    Depression Screen PHQ 2/9 Scores 01/11/2020 12/17/2018 08/05/2018 07/19/2017 07/17/2016 06/30/2015  PHQ - 2 Score 4 5 0 0 0 0  PHQ- 9 Score 14 11 0 0 - -    Fall Risk Fall Risk  01/11/2020 08/05/2018 07/19/2017 07/17/2016 06/30/2015  Falls in the past year? 0 0 No No No  Number falls in past yr: 0 - - - -  Injury with Fall? 0 - - - -    FALL RISK PREVENTION PERTAINING TO THE HOME:  Any stairs in or around the home?  {YES/NO:21197} If so, are there any without handrails? {YES/NO:21197} Home free of loose throw rugs in walkways, pet beds, electrical cords, etc? {YES/NO:21197} Adequate lighting in your home to reduce risk of falls? {YES/NO:21197}  ASSISTIVE DEVICES UTILIZED TO PREVENT FALLS:  Life alert? {YES/NO:21197} Use of a cane, walker or w/c? {YES/NO:21197} Grab bars in the bathroom? {YES/NO:21197} Shower chair or bench in shower? {YES/NO:21197} Elevated toilet seat or a handicapped toilet? {YES/NO:21197}  TIMED UP AND GO:  Was the test performed? No , visit completed over the phone.   Cognitive Function: MMSE - Mini Mental State Exam 08/05/2018 07/19/2017 07/17/2016  Orientation to time 5 5 5   Orientation to Place 5 5 5   Registration 3 3 3   Attention/ Calculation 0 0 0  Recall 2 3 3   Recall-comments unable to recall 1 of 3 words - -  Language- name 2 objects 0 0 0  Language- repeat 1 1 1   Language- follow 3 step command 0 3 3  Language- read & follow direction 0 0 0  Write a sentence 0 0 0  Copy design 0 0 0  Total score 16 20 20         Immunizations Immunization History  Administered Date(s) Administered   Influenza, High Dose Seasonal PF 11/27/2017, 11/12/2018   Influenza-Unspecified 10/22/2016, 11/20/2019   Moderna Sars-Covid-2 Vaccination 04/11/2019, 05/01/2019   PFIZER(Purple Top)SARS-COV-2 Vaccination 04/03/2019, 04/10/2019   Pneumococcal Conjugate-13 06/30/2015   Pneumococcal Polysaccharide-23 07/17/2016   Zoster Recombinat (Shingrix) 08/11/2018, 10/03/2018    {TDAP status:2101805}  {Flu Vaccine status:2101806}  Pneumococcal vaccine status: Up to date  {Covid-19 vaccine status:2101808}  Qualifies for Shingles Vaccine? Yes   Zostavax completed No   Shingrix Completed?: Yes  Screening Tests Health Maintenance  Topic Date Due   OPHTHALMOLOGY EXAM  09/13/2018   COVID-19 Vaccine (5 - Booster) 06/26/2019   INFLUENZA VACCINE  09/12/2020   TETANUS/TDAP  07/18/2026  (Originally 10/11/1948)   FOOT EXAM  01/10/2021   HEMOGLOBIN A1C  02/09/2021   Pneumonia Vaccine 59+ Years old  Completed   DEXA SCAN  Completed   Zoster Vaccines- Shingrix  Completed  HPV VACCINES  Aged Out    Health Maintenance  Health Maintenance Due  Topic Date Due   OPHTHALMOLOGY EXAM  09/13/2018   COVID-19 Vaccine (5 - Booster) 06/26/2019   INFLUENZA VACCINE  09/12/2020    Colorectal cancer screening: No longer required.   Mammogram status: Completed 09/27/20. Repeat every year  {Bone Density status:21018021}  Lung Cancer Screening: (Low Dose CT Chest recommended if Age 36-80 years, 30 pack-year currently smoking OR have quit w/in 15years.) does not qualify.     Additional Screening:  Hepatitis C Screening: does not qualify  Vision Screening: Recommended annual ophthalmology exams for early detection of glaucoma and other disorders of the eye. Is the patient up to date with their annual eye exam?  {YES/NO:21197} Who is the provider or what is the name of the office in which the patient attends annual eye exams? *** If pt is not established with a provider, would they like to be referred to a provider to establish care? {YES/NO:21197}.   Dental Screening: Recommended annual dental exams for proper oral hygiene  Community Resource Referral / Chronic Care Management: CRR required this visit?  {YES/NO:21197}  CCM required this visit?  {YES/NO:21197}     Plan:     I have personally reviewed and noted the following in the patient's chart:   Medical and social history Use of alcohol, tobacco or illicit drugs  Current medications and supplements including opioid prescriptions.  Functional ability and status Nutritional status Physical activity Advanced directives List of other physicians Hospitalizations, surgeries, and ER visits in previous 12 months Vitals Screenings to include cognitive, depression, and falls Referrals and appointments  In addition, I  have reviewed and discussed with patient certain preventive protocols, quality metrics, and best practice recommendations. A written personalized care plan for preventive services as well as general preventive health recommendations were provided to patient.   I connected with *** today by telephone and verified that I am speaking with the correct person using two identifiers. Location patient: home Location provider: work Persons participating in the virtual visit: patient, Marine scientist.    I discussed the limitations, risks, security and privacy concerns of performing an evaluation and management service by telephone and the availability of in person appointments. I also discussed with the patient that there may be a patient responsible charge related to this service. The patient expressed understanding and verbally consented to this telephonic visit.    Interactive audio and video telecommunications were attempted between this provider and patient, however failed, due to patient having technical difficulties OR patient did not have access to video capability.  We continued and completed visit with audio only.  Some vital signs may be absent or patient reported.   Time Spent with patient on telephone encounter: *** minutes   Loma Messing, LPN   96/03/2295   Nurse Health Advisor  Nurse Notes: ***

## 2020-12-19 ENCOUNTER — Ambulatory Visit: Payer: Medicare PPO

## 2020-12-20 ENCOUNTER — Ambulatory Visit: Payer: Medicare PPO | Admitting: Primary Care

## 2020-12-22 ENCOUNTER — Ambulatory Visit: Payer: Medicare PPO

## 2021-01-09 ENCOUNTER — Other Ambulatory Visit: Payer: Self-pay | Admitting: Primary Care

## 2021-01-09 DIAGNOSIS — F4323 Adjustment disorder with mixed anxiety and depressed mood: Secondary | ICD-10-CM

## 2021-02-02 ENCOUNTER — Ambulatory Visit: Payer: Medicare PPO | Admitting: Primary Care

## 2021-02-20 DIAGNOSIS — N1832 Chronic kidney disease, stage 3b: Secondary | ICD-10-CM | POA: Diagnosis not present

## 2021-02-21 DIAGNOSIS — N1832 Chronic kidney disease, stage 3b: Secondary | ICD-10-CM | POA: Diagnosis not present

## 2021-02-21 DIAGNOSIS — R8290 Unspecified abnormal findings in urine: Secondary | ICD-10-CM | POA: Diagnosis not present

## 2021-02-22 ENCOUNTER — Other Ambulatory Visit: Payer: Medicare PPO

## 2021-02-23 DIAGNOSIS — N1831 Chronic kidney disease, stage 3a: Secondary | ICD-10-CM | POA: Diagnosis not present

## 2021-02-23 DIAGNOSIS — Z87442 Personal history of urinary calculi: Secondary | ICD-10-CM | POA: Diagnosis not present

## 2021-02-23 DIAGNOSIS — I10 Essential (primary) hypertension: Secondary | ICD-10-CM | POA: Diagnosis not present

## 2021-02-23 DIAGNOSIS — E1122 Type 2 diabetes mellitus with diabetic chronic kidney disease: Secondary | ICD-10-CM | POA: Diagnosis not present

## 2021-02-27 ENCOUNTER — Telehealth: Payer: Self-pay

## 2021-02-27 NOTE — Telephone Encounter (Signed)
Unable to lm to schedule appt

## 2021-02-27 NOTE — Telephone Encounter (Signed)
[place] Night - Client Nonclinical Telephone Record  [facility] Client [facility] Night - Client Client Site [place] - Night Provider Alma Friendly - NP Contact Type Call Who Is Calling Patient / Member / Family / Caregiver Caller Name [place] Phone Number [phone] Patient Name [name] Patient DOB not sure Call Type Message Only Information Provided Reason for Call Request to Schedule Office Appointment Initial Comment Caller states she would like to schedule either a virtual or over the phone visit for a patient who has memory decline. Patient request to speak to RN No Additional Comment Office hours provided. Disp. Time Disposition Final User 02/26/2021 2:28:08 PM General Information Provided Yes Janith Lima Call Closed By: Janith Lima Transaction Date/Time: 02/26/2021 2:19:28 PM (ET

## 2021-03-10 ENCOUNTER — Encounter: Payer: Self-pay | Admitting: Primary Care

## 2021-03-10 ENCOUNTER — Other Ambulatory Visit: Payer: Self-pay

## 2021-03-10 ENCOUNTER — Ambulatory Visit: Payer: Medicare PPO | Admitting: Primary Care

## 2021-03-10 VITALS — BP 142/70 | HR 64 | Temp 98.1°F | Resp 18 | Ht 61.0 in | Wt 137.0 lb

## 2021-03-10 DIAGNOSIS — E1165 Type 2 diabetes mellitus with hyperglycemia: Secondary | ICD-10-CM | POA: Diagnosis not present

## 2021-03-10 DIAGNOSIS — M1611 Unilateral primary osteoarthritis, right hip: Secondary | ICD-10-CM

## 2021-03-10 DIAGNOSIS — Z794 Long term (current) use of insulin: Secondary | ICD-10-CM | POA: Diagnosis not present

## 2021-03-10 DIAGNOSIS — R229 Localized swelling, mass and lump, unspecified: Secondary | ICD-10-CM

## 2021-03-10 DIAGNOSIS — D1720 Benign lipomatous neoplasm of skin and subcutaneous tissue of unspecified limb: Secondary | ICD-10-CM | POA: Insufficient documentation

## 2021-03-10 DIAGNOSIS — R7303 Prediabetes: Secondary | ICD-10-CM | POA: Diagnosis not present

## 2021-03-10 LAB — POCT GLYCOSYLATED HEMOGLOBIN (HGB A1C): Hemoglobin A1C: 6 % — AB (ref 4.0–5.6)

## 2021-03-10 NOTE — Progress Notes (Signed)
Knot on right thigh X 3 months

## 2021-03-10 NOTE — Patient Instructions (Signed)
You will be contacted regarding your referral to podiatry.  Please let us know if you have not been contacted within two weeks.   You will be contacted regarding your ultrasound.  Please let us know if you have not been contacted within two weeks.   Schedule a visit for your annual follow up for July 2023.  It was a pleasure to see you today!

## 2021-03-10 NOTE — Assessment & Plan Note (Signed)
Exam today suggestive of benign cyst. No hematoma, injury/trauma, or infection noted.  Will order ultrasound soft tissue for further evaluation.   Her fall in October 2022 could have exacerbated an existing cyst or this could be new. She has lost nearly 30 pounds over the last 2-3 years so it may be more visible now.

## 2021-03-10 NOTE — Assessment & Plan Note (Signed)
Overall appears stable since fall in October 2022. She has no concerns today about her hip.  Reviewed xray from ED visit from OCtober 2022.

## 2021-03-10 NOTE — Progress Notes (Signed)
Subjective:    Patient ID: [name], female    DOB: 1929/12/10, 88 y.o.   MRN: [id number]  HPI  [name] is a very pleasant 88 y.o. female with a history of CAD, hypertension, OA of hip, prediabetes, hyperlipidemia  who presents today to discuss skin mass and for prediabetes follow up. She is here with her daughter and daughter in law  1) Skin Mass: She's noticed a "knot" on the right lateral upper thigh since falling in October 2022.  Evaluated in the ED in late October 2022, unclear cause for fall. She underwent CT head which was negative and right hip xray which showed severe arthritis but without fracture.   Since then she's continued to notice the "knot" to the right lateral hip. She denies pain but her daughter sees that she rubs it all the time. She also denies color changes. She has not fallen since October 2022.  She has lost nearly 30 pounds since 2020. She is residing with her family who Is monitoring and preparing meals.   2) Prediabetes:  Her family is wanting a referral for podiatry for toenail trimming and routine foot care.   Current medications include: None   Last A1C: 6.4 in June 2022, 6.0 today Last Eye Exam: UTD Last Foot Exam: Due today Pneumonia Vaccination: Urine Microalbumin: None. Managed on ARB Statin: atorvastatin   Dietary changes since last visit: None   Exercise: None   Wt Readings from Last 3 Encounters:  03/10/21 137 lb (62.1 kg)  12/02/20 139 lb 15.9 oz (63.5 kg)  09/06/20 140 lb (63.5 kg)      Review of Systems  Respiratory:  Negative for shortness of breath.   Cardiovascular:  Negative for chest pain.  Skin:  Negative for color change.       Right lateral thigh mass  Neurological:  Negative for numbness.        Past Medical History:  Diagnosis Date   Arthritis    Breast cancer ([place]) 2001   left    CAD (coronary artery disease)    Asymptomatic   Diabetes mellitus ([place])    Diverticulosis    Hiatal  hernia    History of shingles    ON BACK   Hyperlipidemia    Hypertension    Kidney stone    PVC (premature ventricular contraction)    Vertigo     Social History   Socioeconomic History   Marital status: Widowed    Spouse name: Not on file   Number of children: Not on file   Years of education: Not on file   Highest education level: Not on file  Occupational History   Not on file  Tobacco Use   Smoking status: Never   Smokeless tobacco: Never  Vaping Use   Vaping Use: Never used  Substance and Sexual Activity   Alcohol use: No   Drug use: No   Sexual activity: Not Currently  Other Topics Concern   Not on file  Social History Narrative   Widow.    Lives by herself at home.   Volunteers at [facility] [employer] [facility].   Enjoys volunteering, helping her neighbors, spending time with family, church.   Social Determinants of Health   Financial Resource Strain: Not on file  Food Insecurity: Not on file  Transportation Needs: Not on file  Physical Activity: Not on file  Stress: Not on file  Social Connections: Not on file  Intimate Partner Violence: Not on file  Past Surgical History:  Procedure Laterality Date   BREAST LUMPECTOMY Left 2001   BREAST LUMPECTOMY WITH AXILLARY LYMPH NODE DISSECTION  2003   left   CARDIAC CATHETERIZATION  07/13/2009   Recommendation - medical therapy   CARDIOVASCULAR STRESS TEST  06/16/2009   Mild ischemia demonstrated in the basal inferior, mid inferior, and apical inferior regions.   CATARACTS REMOVED     CHOLECYSTECTOMY     JOINT REPLACEMENT     BILATERAL TOTAL KNEES   KNEE ARTHROPLASTY Right 2009   TOTAL HIP ARTHROPLASTY Left 11/03/2014   Procedure: LEFT TOTAL HIP ARTHROPLASTY ANTERIOR APPROACH;  Surgeon: Gaynelle Arabian, MD;  Location: [facility];  Service: Orthopedics;  Laterality: Left;   TRANSTHORACIC ECHOCARDIOGRAM  06/16/2009   EF >55%, no significant valvular abnormalities.    Family History  Problem Relation Age of Onset   Heart attack Mother     Cancer Mother    Stroke Father    Stroke Brother    Stroke Brother     No Known Allergies  Current Outpatient Medications on File Prior to Visit  Medication Sig Dispense Refill   amLODipine (NORVASC) 5 MG tablet Take 5 mg by mouth daily.     atorvastatin (LIPITOR) 10 MG tablet TAKE 1 TABLET BY MOUTH ONCE DAILY EVERY EVENING FOR CHOLESTEROL 90 tablet 2   losartan (COZAAR) 50 MG tablet TAKE 1 TABLET BY MOUTH ONCE DAILY FOR BLOOD PRESSURE 90 tablet 3   Accu-Chek FastClix Lancets MISC CHECK BLOOD SUGAR 1 TO 2 TIMES DAILY ANDAS DIRECTED (Patient not taking: Reported on 03/10/2021) 102 each 3   ACCU-CHEK SMARTVIEW test strip CHECK BLOOD SUGAR 1 TO 2 TIMES DAILY ANDAS DIRECTED (Patient not taking: Reported on 03/10/2021) 100 each 3   aspirin 81 MG tablet Take 81 mg by mouth daily. (Patient not taking: Reported on 03/10/2021)     benzonatate (TESSALON PERLES) 100 MG capsule Take 1 capsule (100 mg total) by mouth 3 (three) times daily as needed. (Patient not taking: Reported on 03/10/2021) 20 capsule 0   Blood Glucose Monitoring Suppl (ACCU-CHEK AVIVA PLUS) w/Device KIT Check blood sugar 1 - 2 times daily and as directed. E11.9 (Patient not taking: Reported on 03/10/2021) 1 kit 0   sertraline (ZOLOFT) 25 MG tablet TAKE 1 TABLET BY MOUTH ONCE A DAY FOR ANXIETY (Patient not taking: Reported on 03/10/2021) 90 tablet 1   triamcinolone cream (KENALOG) 0.1 % Apply 1 application topically 2 (two) times daily. (Patient not taking: Reported on 03/10/2021) 30 g 0   No current facility-administered medications on file prior to visit.    BP (!) 142/70    Pulse 64    Temp 98.1 F (36.7 C) (Temporal)    Resp 18    Ht 5' 1"  (1.549 m)    Wt 137 lb (62.1 kg)    SpO2 97%    BMI 25.89 kg/m  Objective:   Physical Exam Cardiovascular:     Rate and Rhythm: Normal rate and regular rhythm.  Pulmonary:     Effort: Pulmonary effort is normal.     Breath sounds: Normal breath sounds.  Musculoskeletal:     Cervical back:  Neck supple.  Skin:    General: Skin is warm and dry.     Comments: Tennis ball sized, superficial soft, non tender, flesh colored mass to right lateral upper thigh.   Slightly elongated and thickened toenails bilaterally           Assessment & Plan:  This visit occurred during the SARS-CoV-2 public health emergency.  Safety protocols were in place, including screening questions prior to the visit, additional usage of staff PPE, and extensive cleaning of exam room while observing appropriate contact time as indicated for disinfecting solutions.

## 2021-03-10 NOTE — Assessment & Plan Note (Signed)
Improved with A1C today of 6.0!  Continue to monitor.  Referral placed to podiatry for routine foot care.  Foot exam today negative.

## 2021-04-04 ENCOUNTER — Ambulatory Visit
Admission: RE | Admit: 2021-04-04 | Discharge: 2021-04-04 | Disposition: A | Discharge status: Home / Self Care | Payer: Medicare PPO | Source: Ambulatory Visit | Attending: Primary Care | Admitting: Primary Care

## 2021-04-04 ENCOUNTER — Other Ambulatory Visit: Payer: Self-pay

## 2021-04-04 DIAGNOSIS — R229 Localized swelling, mass and lump, unspecified: Secondary | ICD-10-CM | POA: Diagnosis not present

## 2021-04-04 DIAGNOSIS — R2241 Localized swelling, mass and lump, right lower limb: Secondary | ICD-10-CM | POA: Diagnosis not present

## 2021-04-06 ENCOUNTER — Ambulatory Visit: Payer: Medicare PPO | Admitting: Podiatry

## 2021-04-06 ENCOUNTER — Encounter: Payer: Self-pay | Admitting: Podiatry

## 2021-04-06 ENCOUNTER — Other Ambulatory Visit: Payer: Self-pay

## 2021-04-06 VITALS — BP 130/67 | HR 75 | Temp 97.9°F | Resp 16

## 2021-04-06 DIAGNOSIS — M79675 Pain in left toe(s): Secondary | ICD-10-CM

## 2021-04-06 DIAGNOSIS — B351 Tinea unguium: Secondary | ICD-10-CM

## 2021-04-06 DIAGNOSIS — M79674 Pain in right toe(s): Secondary | ICD-10-CM | POA: Diagnosis not present

## 2021-04-06 NOTE — Progress Notes (Signed)
°  Subjective:  Patient ID: [name], female    DOB: 07-30-1929,  MRN: [id number]  Chief Complaint  Patient presents with   Nail Problem    "Her doctor made an appointment for her, she's Diabetic."   88 y.o. female returns for the above complaint.  Patient presents with thickened elongated dystrophic toenails x10.  Pain on palpation.  She is a diabetic.  She wanted to get it evaluated.  She has not seen anyone else prior to seeing me.  She would like to have the nails debrided down.  Her last A1c was 6.0  Objective:   Vitals:   04/06/21 1044  BP: 130/67  Pulse: 75  Resp: 16  Temp: 97.9 F (36.6 C)   Podiatric Exam: Vascular: dorsalis pedis and posterior tibial pulses are palpable bilateral. Capillary return is immediate. Temperature gradient is WNL. Skin turgor WNL  Sensorium: Normal Semmes Weinstein monofilament test. Normal tactile sensation bilaterally. Nail Exam: Pt has thick disfigured discolored nails with subungual debris noted bilateral entire nail hallux through fifth toenails.  Pain on palpation to the nails. Ulcer Exam: There is no evidence of ulcer or pre-ulcerative changes or infection. Orthopedic Exam: Muscle tone and strength are WNL. No limitations in general ROM. No crepitus or effusions noted.  Skin: No Porokeratosis. No infection or ulcers    Assessment & Plan:   1. Pain due to onychomycosis of toenails of both feet     Patient was evaluated and treated and all questions answered.  Onychomycosis with pain  -Nails palliatively debrided as below. -Educated on self-care  Procedure: Nail Debridement Rationale: pain  Type of Debridement: manual, sharp debridement. Instrumentation: Nail nipper, rotary burr. Number of Nails: 10  Procedures and Treatment: Consent by patient was obtained for treatment procedures. The patient understood the discussion of treatment and procedures well. All questions were answered thoroughly reviewed. Debridement of  mycotic and hypertrophic toenails, 1 through 5 bilateral and clearing of subungual debris. No ulceration, no infection noted.  Return Visit-Office Procedure: Patient instructed to return to the office for a follow up visit 3 months for continued evaluation and treatment.  Boneta Lucks, DPM    No follow-ups on file.

## 2021-04-10 ENCOUNTER — Other Ambulatory Visit: Payer: Self-pay | Admitting: Primary Care

## 2021-04-10 DIAGNOSIS — R229 Localized swelling, mass and lump, unspecified: Secondary | ICD-10-CM

## 2021-04-22 ENCOUNTER — Other Ambulatory Visit: Payer: Self-pay | Admitting: Primary Care

## 2021-04-22 DIAGNOSIS — E785 Hyperlipidemia, unspecified: Secondary | ICD-10-CM

## 2021-05-03 ENCOUNTER — Other Ambulatory Visit: Payer: Self-pay

## 2021-05-03 ENCOUNTER — Ambulatory Visit
Admission: RE | Admit: 2021-05-03 | Discharge: 2021-05-03 | Disposition: A | Discharge status: Home / Self Care | Payer: Medicare PPO | Source: Ambulatory Visit | Attending: Primary Care | Admitting: Primary Care

## 2021-05-03 DIAGNOSIS — R600 Localized edema: Secondary | ICD-10-CM | POA: Diagnosis not present

## 2021-05-03 DIAGNOSIS — R229 Localized swelling, mass and lump, unspecified: Secondary | ICD-10-CM | POA: Insufficient documentation

## 2021-05-03 DIAGNOSIS — M1611 Unilateral primary osteoarthritis, right hip: Secondary | ICD-10-CM | POA: Diagnosis not present

## 2021-05-03 DIAGNOSIS — Z853 Personal history of malignant neoplasm of breast: Secondary | ICD-10-CM | POA: Diagnosis not present

## 2021-05-03 DIAGNOSIS — M25451 Effusion, right hip: Secondary | ICD-10-CM | POA: Diagnosis not present

## 2021-05-15 ENCOUNTER — Encounter: Payer: Self-pay | Admitting: Family Medicine

## 2021-05-15 ENCOUNTER — Ambulatory Visit: Payer: Medicare PPO | Admitting: Family Medicine

## 2021-05-15 VITALS — BP 140/60 | HR 87 | Temp 97.8°F | Ht 61.0 in | Wt 139.6 lb

## 2021-05-15 DIAGNOSIS — F03B0 Unspecified dementia, moderate, without behavioral disturbance, psychotic disturbance, mood disturbance, and anxiety: Secondary | ICD-10-CM | POA: Diagnosis not present

## 2021-05-15 DIAGNOSIS — D1723 Benign lipomatous neoplasm of skin and subcutaneous tissue of right leg: Secondary | ICD-10-CM

## 2021-05-15 DIAGNOSIS — M1711 Unilateral primary osteoarthritis, right knee: Secondary | ICD-10-CM

## 2021-05-15 DIAGNOSIS — M1611 Unilateral primary osteoarthritis, right hip: Secondary | ICD-10-CM

## 2021-05-15 DIAGNOSIS — Z789 Other specified health status: Secondary | ICD-10-CM

## 2021-05-15 DIAGNOSIS — M25561 Pain in right knee: Secondary | ICD-10-CM

## 2021-05-15 DIAGNOSIS — M79651 Pain in right thigh: Secondary | ICD-10-CM

## 2021-05-15 NOTE — Progress Notes (Signed)
? ? ?[name] T. Kari Roza, MD, [name] ?Therapist, music at [facility] ?[place] ?[facility] [place], [address] ? ?Phone: [phone]  FAX: [phone] ? ?[name] - 88 y.o. female  MRN [id number]  Date of Birth: 1929-09-25 ? ?Date: 05/15/2021  PCP: Kari Koch, NP  Referral: Kari Koch, NP ? ?Chief Complaint  ?Patient presents with  ? Hip Pain  ?  Right-MRI 05/03/21 ?  ? ? ?This visit occurred during the SARS-CoV-2 public health emergency.  Safety protocols were in place, including screening questions prior to the visit, additional usage of staff PPE, and extensive cleaning of exam room while observing appropriate contact time as indicated for disinfecting solutions.  ? ?Subjective:  ? ?[name] is a 88 y.o. very pleasant female patient with Body mass index is 26.37 kg/m?. who presents with the following: ? ?This is a new patient to me.  She is seen in consultation regarding right-sided thigh pain. ? ?She is here in the office, and her daughter-in-law provides much of the history.  She does appear moderately demented on my questioning. ? ?She has been having some ongoing right-sided thigh pain.  She denies any back pain.  She denies any focal groin pain. ? ?According to the patient's daughter-in-law, she is ambulating without much difficulty.  Her pain localizes to the lateral thigh.  There is a very large lipoma.  This was recently visualized on an MRI of the femur.  It is obvious on direct visualization.   ? ?I independently reviewed the patient's MRI of the femur.  I reviewed this with the patient and her family.  She does have a very large 7.8 x 5.8 x 2.8 lipoma on the lateral aspect of her thigh.  There is severe arthritic change in the right hip.  There is some significant bone marrow edema at the femoral head.  I do not see a fracture line. ? ?She points to this directly as the source of her pain.  I asked her multiple times, and she directs her  fingers at this time. ? ?She ambulates without difficulty in the examination room. ? ?Review of Systems is noted in the HPI, as appropriate ? ?Objective:  ? ?BP 140/60   Pulse 87   Temp 97.8 ?F (36.6 ?C) (Oral)   Ht '5\' 1"'$  (1.549 m)   Wt 139 lb 9 oz (63.3 kg)   SpO2 99%   BMI 26.37 kg/m?  ? ?GEN: Mild distress on lateral thigh palpation  ?PULM: Breathing comfortably in no respiratory distress ?PSYCH: Normally interactive.  ? ?She ambulates in the examination room without any difficulty. ? ?HIP EXAM: SIDE: Right ?ROM: Abduction, Flexion, Internal and External range of motion: Abduction is mildly limited.  With the hip flexed at 90 degrees patient is able to rotate her hip grossly 25 degrees, and this does not cause any significant pain ?Logroll is negative ?GTB: NT ?SLR: NEG ?Knees: No effusion ?Piriformis: NT at direct palpation ?Strength testing is somewhat limited due to interaction, that I think is mostly dementia and cause. ?Does appear grossly normal ? ?Focal examination around the lipoma.  The patient does seem to be in mild distress when I palpate the lipoma directly, this appears worse distally compared to proximally, but this does appear tender circumferentially around the lipoma.  There also appears to be some fairly notable tenderness on the lateral thigh, she has some pain when I palpate the ITB. ? ?Strength testing non-tender ?  ? ?  Laboratory and Imaging Data: ?MR [facility] RIGHT WO CONTRAST ? ?Result Date: 05/05/2021 ?CLINICAL DATA:  Right thigh lump for 6 months. History of breast cancer EXAM: MRI OF THE RIGHT FEMUR WITHOUT CONTRAST TECHNIQUE: Multiplanar, multisequence MR imaging of the right femur was performed. No intravenous contrast was administered. COMPARISON:  X-ray 12/02/2020.  Ultrasound 04/04/2021 FINDINGS: Bones/Joint/Cartilage Severe osteoarthritis of the right hip with complete joint space loss and marginal osteophytes. There is prominent bone marrow edema within the right femoral head.  A subchondral fracture at this location would be difficult to exclude. Small right hip joint effusion. 2.2 x 1.2 x 2.1 cm paralabral cyst or ganglion along the superolateral aspect of the acetabulum. Postsurgical changes from prior arthroplasties of the left hip and bilateral knees. Osseous structures are otherwise within normal limits. No suspicious bone lesion. Ligaments Intact. Muscles and Tendons No acute musculotendinous abnormality. No intramuscular mass or fluid collection. Mild intramuscular edema within the right adductor compartment. Soft tissues At the lateral aspect of the proximal right thigh is a prominent area of fat within the subcutaneous soft tissues measuring 7.8 x 5.8 x 2.8 cm (series 7, image 21; series 14, image 26). This follows fat signal on all sequences. There a few wispy thin internal septations. No thickened septation or or solid/nodular component. No T2 signal abnormality. No additional masses. No fluid collection. No right inguinal lymphadenopathy. Diverticular changes within the sigmoid colon. IMPRESSION: 1. Focal 7.8 x 5.8 x 2.8 cm area of fat signal within the subcutaneous soft tissues at the lateral aspect of the proximal right thigh. Findings are most consistent with a lipoma. No suspicious features by MRI. 2. Severe osteoarthritis of the right hip. Prominent subchondral bone marrow edema within the right femoral head. A subchondral fracture at this location would be difficult to exclude. Electronically Signed   By: Davina Poke D.O.   On: 05/05/2021 09:00    ? ?CLINICAL DATA:  Right hip pain following fall ?  ?EXAM: ?DG HIP (WITH OR WITHOUT PELVIS) 2-3V RIGHT ?  ?COMPARISON:  None. ?  ?FINDINGS: ?There is no acute fracture or dislocation. Femoroacetabular ?alignment is normal. There is severe degenerative change about the ?right hip. The patient is status post left hip arthroplasty. ?Hardware alignment is within expected limits, without evidence of ?complication. The SI joints  and symphysis pubis are intact. ?  ?IMPRESSION: ?1. No acute fracture or dislocation. ?2. Severe degenerative changes about the right hip. ?  ?  ?Electronically Signed ?  By: Valetta Mole M.D. ?  On: 12/02/2020 12:52 ?  ? ?Assessment and Plan:  ? ?  ICD-10-CM   ?1. Lipoma of right thigh  D17.23   ?  ?2. Moderate dementia without behavioral disturbance (HCC)  F03.B0   ?  ?3. Right thigh pain  M79.651   ?  ?4. Primary localized osteoarthritis of right hip  M16.11   ?  ?5. Poor historian  Z78.9   ?  ? ?Total encounter time: 30 minutes. This includes total time spent on the day of encounter.  Independent film review, discussion with family. ? ?Lipoma of the patient's right thigh is very large and the tissue surrounding it is notably tender to palpation.  Asked multiple times, the patient points her finger directly to this area.  I also elicit significant pain response when palpating the tissue surrounding it. ? ?Patient walks with minimal limp, and I am able to rotate and torque her hip without much significant pain.  There is bone edema at  the femoral head.  This is very common in end-stage arthritis.  I do not see a fracture line.  While a subchondral fracture at the femoral head cannot fully be excluded, clinically this does not seem to be the patient's focal point of pain. ? ?Challenging case.  89.  Lateral thigh lipoma very large.  Spoke about this with the patient's daughter-in-law.  Excision by general surgery could be considered, but they will think about it. ? ?Patient does not want to go under general anesthesia.  I think that her end-stage arthritis of the hip would only be amenable to total hip arthroplasty. ? ?I will communicate all of this with the patient's primary care doctor, and also alert regarding the patient's dementia. ? ?Follow-up: She can follow-up with me only as needed.  I do not think that this is a nonoperative sports medicine case. ? ?Dragon [employer] One speech-to-text software was used for  transcription in this dictation.  Possible transcriptional errors can occur using Editor, commissioning.  ? ?Signed, ? ?Lothar Prehn T. Santino Kinsella, MD ? ? ?Outpatient Encounter Medications as of 05/15/2021  ?Medication Sig  ? a

## 2021-05-22 NOTE — Telephone Encounter (Signed)
Noted, will evaluate as scheduled.  

## 2021-05-22 NOTE — Telephone Encounter (Signed)
Spoke to patient's son Harrie Jeans and appointment is moved up to 05/23/21 at 9:00 am. Harrie Jeans stated that he works in the lab at [employer] and is off tomorrow. Harrie Jeans is going to collect a urine sample and bring to  his mom's appointment tomorrow. ER  precautions were given and he verbalized understanding. Appointment cancelled for 06/15/21 since patient is being seen tomorrow. ?

## 2021-05-23 ENCOUNTER — Ambulatory Visit: Payer: Medicare PPO | Admitting: Primary Care

## 2021-05-23 ENCOUNTER — Encounter: Payer: Self-pay | Admitting: Primary Care

## 2021-05-23 VITALS — BP 118/60 | HR 80 | Ht 64.0 in | Wt 140.0 lb

## 2021-05-23 DIAGNOSIS — I10 Essential (primary) hypertension: Secondary | ICD-10-CM | POA: Diagnosis not present

## 2021-05-23 DIAGNOSIS — R413 Other amnesia: Secondary | ICD-10-CM | POA: Diagnosis not present

## 2021-05-23 DIAGNOSIS — M1611 Unilateral primary osteoarthritis, right hip: Secondary | ICD-10-CM

## 2021-05-23 DIAGNOSIS — F4323 Adjustment disorder with mixed anxiety and depressed mood: Secondary | ICD-10-CM | POA: Diagnosis not present

## 2021-05-23 DIAGNOSIS — R600 Localized edema: Secondary | ICD-10-CM

## 2021-05-23 DIAGNOSIS — D1720 Benign lipomatous neoplasm of skin and subcutaneous tissue of unspecified limb: Secondary | ICD-10-CM | POA: Diagnosis not present

## 2021-05-23 LAB — POC URINALSYSI DIPSTICK (AUTOMATED)
Bilirubin, UA: NEGATIVE
Blood, UA: NEGATIVE
Glucose, UA: NEGATIVE
Leukocytes, UA: NEGATIVE
Nitrite, UA: NEGATIVE
Protein, UA: NEGATIVE
Spec Grav, UA: 1.02 (ref 1.010–1.025)
Urobilinogen, UA: 0.2 E.U./dL
pH, UA: 6.5 (ref 5.0–8.0)

## 2021-05-23 LAB — COMPREHENSIVE METABOLIC PANEL
ALT: 41 U/L — ABNORMAL HIGH (ref 0–35)
AST: 38 U/L — ABNORMAL HIGH (ref 0–37)
Albumin: 3.8 g/dL (ref 3.5–5.2)
Alkaline Phosphatase: 121 U/L — ABNORMAL HIGH (ref 39–117)
BUN: 21 mg/dL (ref 6–23)
CO2: 26 mEq/L (ref 19–32)
Calcium: 9.5 mg/dL (ref 8.4–10.5)
Chloride: 106 mEq/L (ref 96–112)
Creatinine, Ser: 1.11 mg/dL (ref 0.40–1.20)
GFR: 44.04 mL/min — ABNORMAL LOW (ref >60.00)
Glucose, Bld: 122 mg/dL — ABNORMAL HIGH (ref 70–99)
Potassium: 4.3 mEq/L (ref 3.5–5.1)
Sodium: 141 mEq/L (ref 135–145)
Total Bilirubin: 0.6 mg/dL (ref 0.2–1.2)
Total Protein: 5.9 g/dL — ABNORMAL LOW (ref 6.0–8.3)

## 2021-05-23 LAB — TSH: TSH: 1.86 u[IU]/mL (ref 0.35–5.50)

## 2021-05-23 LAB — CBC
HCT: 34.9 % — ABNORMAL LOW (ref 36.0–46.0)
Hemoglobin: 11.7 g/dL — ABNORMAL LOW (ref 12.0–15.0)
MCHC: 33.5 g/dL (ref 30.0–36.0)
MCV: 93.7 fl (ref 78.0–100.0)
Platelets: 300 10*3/uL (ref 150.0–400.0)
RBC: 3.72 Mil/uL — ABNORMAL LOW (ref 3.87–5.11)
RDW: 13.4 % (ref 11.5–15.5)
WBC: 6.3 10*3/uL (ref 4.0–10.5)

## 2021-05-23 LAB — VITAMIN B12: Vitamin B-12: 72 pg/mL — ABNORMAL LOW (ref 211–911)

## 2021-05-23 MED ORDER — CITALOPRAM HYDROBROMIDE 10 MG PO TABS: 10.0000 mg | ORAL_TABLET | Freq: Every day | ORAL | 0 refills | Status: DC

## 2021-05-23 NOTE — Assessment & Plan Note (Signed)
Trial of citalopram 10 mg at bedtime sent to pharmacy. ? ?We will see her back in a few months for follow-up.  Family will also closely monitor. ?

## 2021-05-23 NOTE — Assessment & Plan Note (Signed)
Discussed use of Tylenol as needed. ?

## 2021-05-23 NOTE — Assessment & Plan Note (Signed)
Mostly isolated to ankles and feet. ? ?Suspect her sedentary lifestyle to be contributing.  Encouraged her to elevate her legs when resting. ? ?We will hold amlodipine 5 mg for 1 week, family will update. ?

## 2021-05-23 NOTE — Progress Notes (Signed)
? ?Subjective:  ? ? Patient ID: [name], female    DOB: [birth year], 88 y.o.   MRN: [id number] ? ?HPI ? ?[name] is a very pleasant 88 y.o. female with a history of CAD, hypertension, osteoarthritis, CKD, hyperlipidemia, prediabetes, anxiety and depression who presents today with her son to discuss memory changes and confusion. Her son is providing most of the information for her HPI.  ? ?Her son endorses symptoms of agitation, tearfulness, screaming "like she's dying", getting in and out of bed that occur only at night. Symptoms have been slowly progressing for months. Her son recently gave her Benadryl and played piano music and she slept well.  ? ?Her son has noticed some daytime memory changes, but her confusion and agitation mostly comes in the evening. Her son doesn't recall a history of dementia in the family. She will sometimes refuse to bathe at times, appetite has declined a few times but is mostly good, she will pick at her face, is more non verbal.  ? ?Her right hip and leg have been bothering her for months. She will rub her leg during the day. Her son as applied ice with some improvement. Her son endorses that she doesn't really complain of her leg during the day, but at night she will start reporting increased symptoms. Her son has been providing her with Tylenol with some improvement. She's undergone ultrasound, xray, CT and MRI of her right hip and lower extremity. MR revealed lipoma, severe osteoarthritis of the right hip, potential hairline fracture. ? ?Her family began to notice lower extremity swelling bilaterally. She is mostly sedentary during the day, does get up and walk some. Her family tries to keep her feet elevated, however this is challenging at times. ?  ?BP Readings from Last 3 Encounters:  ?05/23/21 118/60  ?05/15/21 140/60  ?04/06/21 130/67  ? ? ? ? ? ? ?Review of Systems  ?Constitutional:  Negative for fever.  ?Cardiovascular:  Positive for leg swelling.   ?Neurological:   ?     Memory changes  ?Psychiatric/Behavioral:  Positive for agitation, confusion and sleep disturbance. The patient is nervous/anxious.   ? ?   ? ? ?Past Medical History:  ?Diagnosis Date  ? Arthritis   ? Breast cancer ([place]) 2001  ? left   ? CAD (coronary artery disease)   ? Asymptomatic  ? Diabetes mellitus ([place])   ? Diverticulosis   ? Hiatal hernia   ? History of shingles   ? ON BACK  ? Hyperlipidemia   ? Hypertension   ? Kidney stone   ? PVC (premature ventricular contraction)   ? Vertigo   ? ? ?Social History  ? ?Socioeconomic History  ? Marital status: Widowed  ?  Spouse name: Not on file  ? Number of children: Not on file  ? Years of education: Not on file  ? Highest education level: Not on file  ?Occupational History  ? Not on file  ?Tobacco Use  ? Smoking status: Never  ? Smokeless tobacco: Never  ?Vaping Use  ? Vaping Use: Never used  ?Substance and Sexual Activity  ? Alcohol use: No  ? Drug use: No  ? Sexual activity: Not Currently  ?Other Topics Concern  ? Not on file  ?Social History Narrative  ? Widow.   ? Lives by herself at home.  ? Volunteers at [employer]  ? Enjoys volunteering, helping her neighbors, spending time with family, church.  ? ?Social Determinants of Health  ? ?  Financial Resource Strain: Not on file  ?Food Insecurity: Not on file  ?Transportation Needs: Not on file  ?Physical Activity: Not on file  ?Stress: Not on file  ?Social Connections: Not on file  ?Intimate Partner Violence: Not on file  ? ? ?Past Surgical History:  ?Procedure Laterality Date  ? BREAST LUMPECTOMY Left 2001  ? BREAST LUMPECTOMY WITH AXILLARY LYMPH NODE DISSECTION  2003  ? left  ? CARDIAC CATHETERIZATION  07/13/2009  ? Recommendation - medical therapy  ? CARDIOVASCULAR STRESS TEST  06/16/2009  ? Mild ischemia demonstrated in the basal inferior, mid inferior, and apical inferior regions.  ? CATARACTS REMOVED    ? CHOLECYSTECTOMY    ? JOINT REPLACEMENT    ? BILATERAL TOTAL KNEES  ? KNEE ARTHROPLASTY Right  2009  ? TOTAL HIP ARTHROPLASTY Left 11/03/2014  ? Procedure: LEFT TOTAL HIP ARTHROPLASTY ANTERIOR APPROACH;  Surgeon: Gaynelle Arabian, MD;  Location: [facility];  Service: Orthopedics;  Laterality: Left;  ? TRANSTHORACIC ECHOCARDIOGRAM  06/16/2009  ? EF >55%, no significant valvular abnormalities.  ? ? ?Family History  ?Problem Relation Age of Onset  ? Heart attack Mother   ? Cancer Mother   ? Stroke Father   ? Stroke Brother   ? Stroke Brother   ? ? ?No Known Allergies ? ?Current Outpatient Medications on File Prior to Visit  ?Medication Sig Dispense Refill  ? amLODipine (NORVASC) 5 MG tablet Take 5 mg by mouth daily.    ? atorvastatin (LIPITOR) 10 MG tablet TAKE 1 TABLET BY MOUTH ONCE DAILY EVERY EVENING FOR CHOLESTEROL 90 tablet 2  ? losartan (COZAAR) 50 MG tablet TAKE 1 TABLET BY MOUTH ONCE DAILY FOR BLOOD PRESSURE 90 tablet 3  ? triamcinolone cream (KENALOG) 0.1 % Apply 1 application topically 2 (two) times daily. 30 g 0  ? ?No current facility-administered medications on file prior to visit.  ? ? ?BP 118/60   Pulse 80   Ht '5\' 4"'$  (1.626 m)   Wt 140 lb (63.5 kg)   SpO2 99%   BMI 24.03 kg/m?  ?Objective:  ? Physical Exam ?Constitutional:   ?   General: She is not in acute distress. ?   Appearance: She is not ill-appearing.  ?Cardiovascular:  ?   Rate and Rhythm: Normal rate and regular rhythm.  ?   Comments: Moderate pedal and ankle edema bilaterally. ?No pitting to lower extremities. ?Pulmonary:  ?   Effort: Pulmonary effort is normal.  ?   Breath sounds: Normal breath sounds.  ?Musculoskeletal:  ?   Cervical back: Neck supple.  ?Skin: ?   General: Skin is warm and dry.  ?Neurological:  ?   Mental Status: She is alert and oriented to person, place, and time.  ?   Comments: Follows basic commands, unable to answer questions regarding her agitation and anxiety.  ?Psychiatric:     ?   Mood and Affect: Mood normal.  ? ? ? ? ? ?   ?Assessment & Plan:  ? ? ? ? ?This visit occurred during the SARS-CoV-2 public health  emergency.  Safety protocols were in place, including screening questions prior to the visit, additional usage of staff PPE, and extensive cleaning of exam room while observing appropriate contact time as indicated for disinfecting solutions.  ?

## 2021-05-23 NOTE — Assessment & Plan Note (Signed)
Identified to right upper portion of lower extremity. ? ?Reviewed ultrasound, CT, MRI. ?

## 2021-05-23 NOTE — Patient Instructions (Addendum)
For agitation and anxiety start citalopram 10 mg.  Take 1 tablet by mouth at bedtime. ? ?You may take Tylenol as needed for your hip and leg pain.  Do not exceed 3000 mg in 24 hours. ? ?Stop taking amlodipine 5 mg for one week. This is for blood pressure and it may be contributing to your foot swelling. ? ?Stop by the lab prior to leaving today. I will notify you of your results once received.  ? ?Please update me in about a week regarding your foot swelling. ? ?It was a pleasure to see you today! ? ? ?

## 2021-05-23 NOTE — Assessment & Plan Note (Signed)
Controlled, however we will be holding amlodipine for 1 week to see if this helps to reduce pedal edema. ? ?Continue losartan 50 mg daily. ?Family will update ?

## 2021-05-23 NOTE — Assessment & Plan Note (Addendum)
Slowly progressing over months. ? ?UA was checked today which is negative ? ?We will check labs today including CBC, CMP, TSH, B12. ? ?She does seem to be experiencing sundowning, especially as her symptoms are exclusively at night.  She was once managed on sertraline, however has not been taking for quite some time. ? ?Prescription for citalopram 10 mg sent to pharmacy.  Family will update, or report if any issues. ?

## 2021-05-31 ENCOUNTER — Ambulatory Visit (INDEPENDENT_AMBULATORY_CARE_PROVIDER_SITE_OTHER): Payer: Medicare PPO

## 2021-05-31 DIAGNOSIS — E538 Deficiency of other specified B group vitamins: Secondary | ICD-10-CM

## 2021-05-31 MED ORDER — CYANOCOBALAMIN 1000 MCG/ML IJ SOLN
1000.0000 ug | Freq: Once | INTRAMUSCULAR | Status: AC
Start: 2021-05-31 — End: 2021-05-31
  Administered 2021-05-31: 1000 ug via INTRAMUSCULAR

## 2021-05-31 NOTE — Progress Notes (Signed)
Pt came in to get a vit b12 injection , pt was given a vit b12 injection IM 1,'000mg'$ /ml  in the left deltoid. Pt 's son also wanted to have her b/p check because pt's pcp had stopped   b/p medication for 7 days. Pt b/p was 110/60 . Spoke with Allie Bossier, NP , pt is to remain off of  b/p medication, notified son of this information.  ?

## 2021-06-07 ENCOUNTER — Ambulatory Visit (INDEPENDENT_AMBULATORY_CARE_PROVIDER_SITE_OTHER): Payer: Medicare PPO

## 2021-06-07 VITALS — BP 118/68 | HR 76

## 2021-06-07 DIAGNOSIS — E538 Deficiency of other specified B group vitamins: Secondary | ICD-10-CM

## 2021-06-07 MED ORDER — CYANOCOBALAMIN 1000 MCG/ML IJ SOLN
1000.0000 ug | Freq: Once | INTRAMUSCULAR | Status: AC
  Administered 2021-06-07: 1000 ug via INTRAMUSCULAR

## 2021-06-07 NOTE — Progress Notes (Signed)
? ?  Nurse Visit  ? ?Date of Encounter: 06/07/2021 ?ID: [name], DOB May 24, 1929, MRN [id number] ? ?PCP:  Pleas Koch, NP ?  ? ?Visit Details  ? ?VS: Pt came in today for a Vit B 12 injection. Pt 's son also wanted to have his mother Blood Pressure Check. Informed patient and son that Silvestre Moment was out of the office this week and I will have to sent the note to other provider in the office. He said the Anda Kraft wanted it check at every nurse visit , to see if she need to remain off of b/p  medication. Per Anda Kraft last note pt was advised to d/c medication. Per  son request check I checked pt's b/p 118/68. Also mention to he might mention her b/p at home and sent [place] message though my chart ,so if there are any changes we can let the provider know . ? ?Pt was given Vit b12 injection IM in rt deltoid successfully.   ? ?Did notices swelling in both legs and feet. ? ?Wt Readings from Last 3 Encounters:  ?05/23/21 140 lb (63.5 kg)  ?05/15/21 139 lb 9 oz (63.3 kg)  ?03/10/21 137 lb (62.1 kg)  ?  ? ? ? ? ?Medications Adjustments/Labs and Tests Ordered: ?No orders of the defined types were placed in this encounter. ? ?Meds ordered this encounter  ?Medications  ? cyanocobalamin ((VITAMIN B-12)) injection 1,000 mcg  ? ? ? ?Signed, ?Mardelle Matte, CMA  ?06/07/2021 4:05 PM  ?

## 2021-06-14 ENCOUNTER — Ambulatory Visit (INDEPENDENT_AMBULATORY_CARE_PROVIDER_SITE_OTHER): Payer: Medicare PPO

## 2021-06-14 DIAGNOSIS — E538 Deficiency of other specified B group vitamins: Secondary | ICD-10-CM

## 2021-06-14 MED ORDER — CYANOCOBALAMIN 1000 MCG/ML IJ SOLN
1000.0000 ug | Freq: Once | INTRAMUSCULAR | Status: AC
  Administered 2021-06-14: 1000 ug via INTRAMUSCULAR

## 2021-06-14 NOTE — Progress Notes (Signed)
Per orders of Kate Clark, AGNP-C, injection of B-12 given by Larin Weissberg in left deltoid. Patient tolerated injection well.    

## 2021-06-15 ENCOUNTER — Ambulatory Visit: Payer: Medicare PPO | Admitting: Primary Care

## 2021-06-20 ENCOUNTER — Emergency Department
Admission: EM | Admit: 2021-06-20 | Discharge: 2021-06-20 | Disposition: A | Discharge status: Home / Self Care | Payer: Medicare PPO | Attending: Emergency Medicine | Admitting: Emergency Medicine

## 2021-06-20 ENCOUNTER — Other Ambulatory Visit: Payer: Self-pay

## 2021-06-20 ENCOUNTER — Emergency Department: Payer: Medicare PPO

## 2021-06-20 ENCOUNTER — Encounter: Payer: Self-pay | Admitting: Emergency Medicine

## 2021-06-20 DIAGNOSIS — W19XXXA Unspecified fall, initial encounter: Secondary | ICD-10-CM

## 2021-06-20 DIAGNOSIS — W01198A Fall on same level from slipping, tripping and stumbling with subsequent striking against other object, initial encounter: Secondary | ICD-10-CM | POA: Insufficient documentation

## 2021-06-20 DIAGNOSIS — S199XXA Unspecified injury of neck, initial encounter: Secondary | ICD-10-CM | POA: Diagnosis not present

## 2021-06-20 DIAGNOSIS — I10 Essential (primary) hypertension: Secondary | ICD-10-CM | POA: Insufficient documentation

## 2021-06-20 DIAGNOSIS — E119 Type 2 diabetes mellitus without complications: Secondary | ICD-10-CM | POA: Diagnosis not present

## 2021-06-20 DIAGNOSIS — R58 Hemorrhage, not elsewhere classified: Secondary | ICD-10-CM | POA: Diagnosis not present

## 2021-06-20 DIAGNOSIS — S0181XA Laceration without foreign body of other part of head, initial encounter: Secondary | ICD-10-CM

## 2021-06-20 DIAGNOSIS — M47812 Spondylosis without myelopathy or radiculopathy, cervical region: Secondary | ICD-10-CM | POA: Diagnosis not present

## 2021-06-20 DIAGNOSIS — M4319 Spondylolisthesis, multiple sites in spine: Secondary | ICD-10-CM | POA: Diagnosis not present

## 2021-06-20 DIAGNOSIS — Y92009 Unspecified place in unspecified non-institutional (private) residence as the place of occurrence of the external cause: Secondary | ICD-10-CM | POA: Insufficient documentation

## 2021-06-20 DIAGNOSIS — I2510 Atherosclerotic heart disease of native coronary artery without angina pectoris: Secondary | ICD-10-CM | POA: Insufficient documentation

## 2021-06-20 DIAGNOSIS — S0990XA Unspecified injury of head, initial encounter: Secondary | ICD-10-CM | POA: Diagnosis not present

## 2021-06-20 DIAGNOSIS — G319 Degenerative disease of nervous system, unspecified: Secondary | ICD-10-CM | POA: Diagnosis not present

## 2021-06-20 DIAGNOSIS — S0101XA Laceration without foreign body of scalp, initial encounter: Secondary | ICD-10-CM | POA: Diagnosis not present

## 2021-06-20 DIAGNOSIS — M4802 Spinal stenosis, cervical region: Secondary | ICD-10-CM | POA: Diagnosis not present

## 2021-06-20 DIAGNOSIS — F0390 Unspecified dementia without behavioral disturbance: Secondary | ICD-10-CM | POA: Insufficient documentation

## 2021-06-20 DIAGNOSIS — I639 Cerebral infarction, unspecified: Secondary | ICD-10-CM | POA: Diagnosis not present

## 2021-06-20 MED ORDER — LIDOCAINE-EPINEPHRINE (PF) 1 %-1:200000 IJ SOLN
10.0000 mL | Freq: Once | INTRAMUSCULAR | Status: AC
  Administered 2021-06-20: 10 mL via INTRADERMAL
  Filled 2021-06-20: qty 10

## 2021-06-20 NOTE — ED Notes (Signed)
First nurse note-pt brought in via ems from home with a mechanical fall in the gravel driveway without her walker.  Pt has forehead lac.  No loc  no blood thinner.  Hx dementia. Bp-130/72,p-88,oxygen sats 98% rm air, fsbs 132 per ems.  ?

## 2021-06-20 NOTE — Discharge Instructions (Signed)
[name] has a normal exam following her fall.  No CT evidence of closed head or spinal cord injury.  Patient laceration has been closed using a subcutaneous dissolvable suture as well as wound glue.  Keep the wound glue area free of any lotions, creams, ointments.  Take OTC Tylenol as needed for pain. ?

## 2021-06-20 NOTE — ED Triage Notes (Signed)
Present via EMS s/p fall  per family she tripped on some rocks in the yard  hitting her head    no LOC  small laceration noted to forehed ?

## 2021-06-20 NOTE — ED Provider Notes (Signed)
? ? ?[facility] ?Emergency Department Provider Note ? ? ? ? Event Date/Time  ? First MD Initiated Contact with Patient 06/20/21 1740   ?  (approximate) ? ? ?History  ? ?Fall ? ? ?HPI ? ?[name] is a 88 y.o. female with a history of hypertension, diabetes, CAD without anticoagulation, osteoarthritis, dementia with memory loss presents to the ED from home via EMS.  Patient sustained a mechanical fall at home in the ER.  She apparently tripped over some rocks, falling hitting her head, resulting in a small laceration to the forehead.  No reported LOC, weakness, vision loss, or change from baseline reported. ? ? ?Physical Exam  ? ?Triage Vital Signs: ?ED Triage Vitals  ?Enc Vitals Group  ?   BP 06/20/21 1739 120/70  ?   Pulse Rate 06/20/21 1739 78  ?   Resp 06/20/21 1739 20  ?   Temp 06/20/21 1739 98 ?F (36.7 ?C)  ?   Temp Source 06/20/21 1739 Oral  ?   SpO2 06/20/21 1739 98 %  ?   Weight 06/20/21 1737 139 lb 15.9 oz (63.5 kg)  ?   Height 06/20/21 1737 '5\' 4"'$  (1.626 m)  ?   Head Circumference --   ?   Peak Flow --   ?   Pain Score 06/20/21 1737 0  ?   Pain Loc --   ?   Pain Edu? --   ?   Excl. in [place]? --   ? ? ?Most recent vital signs: ?Vitals:  ? 06/20/21 1739  ?BP: 120/70  ?Pulse: 78  ?Resp: 20  ?Temp: 98 ?F (36.7 ?C)  ?SpO2: 98%  ? ? ?General Awake, no distress.  ?HEENT NCAT, except for a L-shaped laceration to the central forehead. PERRL. EOMI. No epistaxis or nasal septal hematoma. Mucous membranes are moist.  ?CV:  Good peripheral perfusion.  ?RESP:  Normal effort.  CTA ?ABD:  No distention.  ?MSK:  Normal spinal alignment without midline tenderness, spasm, deformity, or step-off.  Neck is supple. ? ? ?ED Results / Procedures / Treatments  ? ?Labs ?(all labs ordered are listed, but only abnormal results are displayed) ?Labs Reviewed - No data to display ? ? ?EKG ? ? ?RADIOLOGY ? ?I personally viewed and evaluated these images as part of my medical decision making, as well as  reviewing the written report by the radiologist. ? ?ED Provider Interpretation: no acute findings} ? ?CT HEAD WO CONTRAST (5MM) ? ?Result Date: 06/20/2021 ?CLINICAL DATA:  Head trauma, minor (Age >= 65y) Mechanical fall in gravel driveway.  Forehead laceration. EXAM: CT HEAD WITHOUT CONTRAST TECHNIQUE: Contiguous axial images were obtained from the base of the skull through the vertex without intravenous contrast. RADIATION DOSE REDUCTION: This exam was performed according to the departmental dose-optimization program which includes automated exposure control, adjustment of the mA and/or kV according to patient size and/or use of iterative reconstruction technique. COMPARISON:  Head CT 12/02/2020 FINDINGS: Brain: Stable degree of atrophy and chronic small vessel ischemic change. No intracranial hemorrhage, mass effect, or midline shift. No hydrocephalus. The basilar cisterns are patent. Tiny remote lacunar infarct in the right caudate. No evidence of territorial infarct or acute ischemia. No extra-axial or intracranial fluid collection. Vascular: Atherosclerosis of skullbase vasculature without hyperdense vessel or abnormal calcification. Skull: No fracture or focal lesion. Sinuses/Orbits: Small mucous retention cyst in left ethmoid air cells no fracture or acute findings. Minimal partial opacification of the lower left mastoid air cells,  chronic. Other: Frontal scalp laceration minimal frontal scalp contusion. IMPRESSION: 1. Frontal scalp laceration and minimal frontal scalp contusion. No acute intracranial abnormality. No skull fracture. 2. Stable atrophy and chronic small vessel ischemic change. Electronically Signed   By: Keith Rake M.D.   On: 06/20/2021 18:20  ? ?CT Cervical Spine Wo Contrast ? ?Result Date: 06/20/2021 ?CLINICAL DATA:  Mechanical fall on gravel driveway. Forehead laceration. EXAM: CT CERVICAL SPINE WITHOUT CONTRAST TECHNIQUE: Multidetector CT imaging of the cervical spine was performed  without intravenous contrast. Multiplanar CT image reconstructions were also generated. RADIATION DOSE REDUCTION: This exam was performed according to the departmental dose-optimization program which includes automated exposure control, adjustment of the mA and/or kV according to patient size and/or use of iterative reconstruction technique. COMPARISON:  None Available. FINDINGS: Alignment: No traumatic subluxation. There is degenerative anterolisthesis of C4 on C5 and C7 on T1, degenerative retrolisthesis of C6 on C7. Skull base and vertebrae: No acute fracture. Vertebral body heights are maintained. The dens and skull base are intact. Soft tissues and spinal canal: No prevertebral fluid or swelling. No visible canal hematoma. Disc levels: Degenerative disc disease from C3-C4 through C6-C7. Calcified posterior disc osteophyte complex at C6-C7 causes narrowing of the spinal canal. There is moderate multilevel facet hypertrophy. Upper chest: No acute or unexpected findings. Biapical pleuroparenchymal scarring. Aortic and branch atherosclerosis. Other: Carotid calcifications. IMPRESSION: Degenerative change in the cervical spine without acute fracture or subluxation. Aortic Atherosclerosis (ICD10-I70.0). Electronically Signed   By: Keith Rake M.D.   On: 06/20/2021 18:24   ? ? ?PROCEDURES: ? ?Critical Care performed: No ? ?Marland Kitchen.Laceration Repair ? ?Date/Time: 06/20/2021 5:56 PM ?Performed by: Melvenia Needles, PA-C ?Authorized by: Melvenia Needles, PA-C  ? ?Consent:  ?  Consent obtained:  Verbal ?  Consent given by:  Patient ?  Risks, benefits, and alternatives were discussed: yes   ?  Risks discussed:  Pain and poor cosmetic result ?Universal protocol:  ?  Site/side marked: yes   ?  Immediately prior to procedure, a time out was called: yes   ?  Patient identity confirmed:  Verbally with patient ?Anesthesia:  ?  Anesthesia method:  Local infiltration ?  Local anesthetic:  Lidocaine 1% WITH  epi ?Laceration details:  ?  Location:  Face ?  Face location:  Forehead ?  Length (cm):  2 ?  Depth (mm):  5 ?Pre-procedure details:  ?  Preparation:  Patient was prepped and draped in usual sterile fashion ?Exploration:  ?  Limited defect created (wound extended): no   ?  Contaminated: no   ?Treatment:  ?  Area cleansed with:  Saline and povidone-iodine ?  Amount of cleaning:  Standard ?  Irrigation solution:  Sterile saline ?  Irrigation volume:  5 ?  Irrigation method:  Syringe ?  Debridement:  None ?  Undermining:  None ?  Scar revision: no   ?Skin repair:  ?  Repair method:  Sutures and tissue adhesive ?  Suture size:  5-0 ?  Suture material:  Fast-absorbing gut ?  Suture technique:  Running ?  Number of sutures:  1 ?Approximation:  ?  Approximation:  Close ?Repair type:  ?  Repair type:  Intermediate ?Post-procedure details:  ?  Dressing:  Open (no dressing) ?  Procedure completion:  Tolerated well, no immediate complications ? ? ?MEDICATIONS ORDERED IN ED: ?Medications  ?lidocaine-EPINEPHrine (PF) (XYLOCAINE-EPINEPHrine) 1 %-1:200000 (PF) injection 10 mL (10 mLs Intradermal Given by Other 06/20/21 1815)  ? ? ? ?  IMPRESSION / MDM / ASSESSMENT AND PLAN / ED COURSE  ?I reviewed the triage vital signs and the nursing notes. ?             ?               ? ?Differential diagnosis includes, but is not limited to, patient contusion, facial laceration, head contusion, SDH, cervical fracture, cervical sprain ? ?Geriatric patient to the ED for evaluation of injury sustained following a mechanical fall.  Patient presents accompanied by her adult son, for evaluation after she apparently tripped at home.  She sustained a laceration to the forehead.  No reported LOC.  Patient was further evaluated with a CT scan which did not reveal any acute intracranial process or cervical spine injury.  This is based on my review of images and radiology report.  Patient does consent to a facial laceration repair.  The laceration was  repaired with a single running subcutaneous absorbable fall at home with suture, the skin is approximated further with Dermabond.  Patient's diagnosis is consistent with fall at home with minor head injury and facial laceration. P

## 2021-06-21 ENCOUNTER — Ambulatory Visit (INDEPENDENT_AMBULATORY_CARE_PROVIDER_SITE_OTHER): Payer: Medicare PPO

## 2021-06-21 DIAGNOSIS — E538 Deficiency of other specified B group vitamins: Secondary | ICD-10-CM | POA: Diagnosis not present

## 2021-06-21 MED ORDER — CYANOCOBALAMIN 1000 MCG/ML IJ SOLN
1000.0000 ug | Freq: Once | INTRAMUSCULAR | Status: AC
  Administered 2021-06-21: 1000 ug via INTRAMUSCULAR

## 2021-06-21 NOTE — Progress Notes (Signed)
Patient presented for B 12 injection given by Gussie Murton, CMA to right deltoid, patient voiced no concerns nor showed any signs of distress during injection.  

## 2021-07-04 NOTE — Telephone Encounter (Signed)
Patient son came by office this afternoon. Due to this being added to later message I did not see. I have printed off last office note and gave to him but did not see that she was requesting last five. If what I have given is not right I have advised him at that time to reach out to our office.

## 2021-07-04 NOTE — Telephone Encounter (Signed)
Can you assist with this request? Does patient or family need to sign anything first?

## 2021-07-05 NOTE — Telephone Encounter (Signed)
Noted  

## 2021-07-07 ENCOUNTER — Telehealth: Payer: Self-pay

## 2021-07-07 NOTE — Telephone Encounter (Signed)
Please first call the patient and her son to ensure that they initiated the claim for benefits under long-term care coverage through [employer].  It looks like she has an appointment scheduled with me for 07/11/2021, we can complete any forms during this visit.

## 2021-07-07 NOTE — Telephone Encounter (Signed)
Received ppw from [employer]. Placed in folder for review.

## 2021-07-11 ENCOUNTER — Ambulatory Visit: Payer: Medicare PPO | Admitting: Primary Care

## 2021-07-11 VITALS — BP 136/88 | HR 100 | Temp 98.6°F | Ht 64.0 in | Wt 144.0 lb

## 2021-07-11 DIAGNOSIS — F4323 Adjustment disorder with mixed anxiety and depressed mood: Secondary | ICD-10-CM

## 2021-07-11 DIAGNOSIS — R413 Other amnesia: Secondary | ICD-10-CM | POA: Diagnosis not present

## 2021-07-11 DIAGNOSIS — E538 Deficiency of other specified B group vitamins: Secondary | ICD-10-CM

## 2021-07-11 LAB — POC URINALSYSI DIPSTICK (AUTOMATED)
Bilirubin, UA: NEGATIVE
Blood, UA: NEGATIVE
Glucose, UA: NEGATIVE
Ketones, UA: NEGATIVE
Leukocytes, UA: NEGATIVE
Nitrite, UA: NEGATIVE
Protein, UA: NEGATIVE
Spec Grav, UA: 1.015 (ref 1.010–1.025)
Urobilinogen, UA: 0.2 E.U./dL
pH, UA: 6 (ref 5.0–8.0)

## 2021-07-11 LAB — BASIC METABOLIC PANEL
BUN: 16 mg/dL (ref 6–23)
CO2: 27 mEq/L (ref 19–32)
Calcium: 9 mg/dL (ref 8.4–10.5)
Chloride: 105 mEq/L (ref 96–112)
Creatinine, Ser: 1.07 mg/dL (ref 0.40–1.20)
GFR: 45.97 mL/min — ABNORMAL LOW (ref >60.00)
Glucose, Bld: 157 mg/dL — ABNORMAL HIGH (ref 70–99)
Potassium: 4 mEq/L (ref 3.5–5.1)
Sodium: 139 mEq/L (ref 135–145)

## 2021-07-11 LAB — VITAMIN B12: Vitamin B-12: 301 pg/mL (ref 211–911)

## 2021-07-11 MED ORDER — HYDROXYZINE HCL 10 MG PO TABS
10.0000 mg | ORAL_TABLET | Freq: Every evening | ORAL | 0 refills | Status: DC | PRN
Start: 2021-07-11 — End: 2021-10-03

## 2021-07-11 MED ORDER — CITALOPRAM HYDROBROMIDE 20 MG PO TABS: 20.0000 mg | ORAL_TABLET | Freq: Every day | ORAL | 0 refills | Status: DC

## 2021-07-11 NOTE — Patient Instructions (Signed)
Stop by the lab prior to leaving today. I will notify you of your results once received.   We increased the dose of your citalopram medication to 20 mg for anxiety.  You may take 2 of the 10 mg tablets to equal 20 mg, I did send a new prescription for the 20 mg dose to your pharmacy.  Only take one of the 20 mg tablets once daily.  Please update me in 1 month regarding symptoms.  It was a pleasure to see you today!

## 2021-07-11 NOTE — Progress Notes (Signed)
Subjective:    Patient ID: [name], female    DOB: 1930/01/01, 88 y.o.   MRN: [id number]  HPI  [name] is a very pleasant 88 y.o. female with a history of CAD, hypertension, vitamin B12 deficiency, osteoarthritis of hip, CKD, prediabetes, anxiety and depression, memory changes, lipoma of right lower extremity who presents today for form completion and evaluation of dementia.  Her son joins [place] today who is providing most of the HPI.  We received a cognitive questionnaire form from [employer] for long term care benefits.   She was last evaluated on 05/23/2021 with her son to discuss memory changes and confusion.  Symptoms included agitation, tearfulness, screaming at night, getting out of bed at night, daytime memory changes.  During this visit we checked labs which revealed low vitamin B12.    We also initiated citalopram 10 mg daily for anxiety/agitation symptoms; and shortly after we initiated weekly IM vitamin B12 1000 mcg injections. She has completed 4 vitamin B12 injections in total.   Since her last visit her son mentions that she continues to remain confused at night, getting out of bed, yelling at night. She is starting to forget her son's identity. He's started to notice some symptoms during the day whereas previously most of her symptoms were at night. Her family has noticed improvement since starting citalopram as she is not complaining of her right lower extremity pain. Her family will provide her with Benadryl every other night for rest.   She was visited by a nurse from her insurance company, [employer], she is needing a form to be completed. She is visited by nurse aids twice weekly which help with ADL's. She is needing the form to be completed for coverage of her nurse aid treatment.   Her son has noticed a smell to her urine lately. He brought in a urine sample today for which she provided last night.   BP Readings  from Last 3 Encounters:  07/11/21 136/88  06/20/21 120/70  06/07/21 118/68     Review of Systems  Constitutional:  Negative for appetite change.  Genitourinary:        Foul smelling urine  Psychiatric/Behavioral:  Positive for confusion and sleep disturbance. The patient is nervous/anxious.         Past Medical History:  Diagnosis Date   Arthritis    Breast cancer ([place]) 2001   left    CAD (coronary artery disease)    Asymptomatic   Diabetes mellitus ([place])    Diverticulosis    Hiatal hernia    History of shingles    ON BACK   Hyperlipidemia    Hypertension    Kidney stone    PVC (premature ventricular contraction)    Vertigo     Social History   Socioeconomic History   Marital status: Widowed    Spouse name: Not on file   Number of children: Not on file   Years of education: Not on file   Highest education level: Not on file  Occupational History   Not on file  Tobacco Use   Smoking status: Never   Smokeless tobacco: Never  Vaping Use   Vaping Use: Never used  Substance and Sexual Activity   Alcohol use: No   Drug use: No   Sexual activity: Not Currently  Other Topics Concern   Not on file  Social History Narrative   Widow.    Lives by herself  at home.   Volunteers at [facility].   Enjoys volunteering, helping her neighbors, spending time with family, church.   Social Determinants of Health   Financial Resource Strain: Not on file  Food Insecurity: Not on file  Transportation Needs: Not on file  Physical Activity: Not on file  Stress: Not on file  Social Connections: Not on file  Intimate Partner Violence: Not on file    Past Surgical History:  Procedure Laterality Date   BREAST LUMPECTOMY Left 2001   BREAST LUMPECTOMY WITH AXILLARY LYMPH NODE DISSECTION  2003   left   CARDIAC CATHETERIZATION  07/13/2009   Recommendation - medical therapy   CARDIOVASCULAR STRESS TEST  06/16/2009   Mild ischemia demonstrated in the basal inferior, mid inferior, and  apical inferior regions.   CATARACTS REMOVED     CHOLECYSTECTOMY     JOINT REPLACEMENT     BILATERAL TOTAL KNEES   KNEE ARTHROPLASTY Right 2009   TOTAL HIP ARTHROPLASTY Left 11/03/2014   Procedure: LEFT TOTAL HIP ARTHROPLASTY ANTERIOR APPROACH;  Surgeon: Gaynelle Arabian, MD;  Location: [facility];  Service: Orthopedics;  Laterality: Left;   TRANSTHORACIC ECHOCARDIOGRAM  06/16/2009   EF >55%, no significant valvular abnormalities.    Family History  Problem Relation Age of Onset   Heart attack Mother    Cancer Mother    Stroke Father    Stroke Brother    Stroke Brother     No Known Allergies  Current Outpatient Medications on File Prior to Visit  Medication Sig Dispense Refill   atorvastatin (LIPITOR) 10 MG tablet TAKE 1 TABLET BY MOUTH ONCE DAILY EVERY EVENING FOR CHOLESTEROL 90 tablet 2   losartan (COZAAR) 50 MG tablet TAKE 1 TABLET BY MOUTH ONCE DAILY FOR BLOOD PRESSURE 90 tablet 3   triamcinolone cream (KENALOG) 0.1 % Apply 1 application topically 2 (two) times daily. 30 g 0   [DISCONTINUED] citalopram (CELEXA) 10 MG tablet Take 1 tablet (10 mg total) by mouth at bedtime. For anxiety and agitation 90 tablet 0   No current facility-administered medications on file prior to visit.    BP 136/88   Pulse 100   Temp 98.6 F (37 C) (Oral)   Ht '5\' 4"'$  (1.626 m)   Wt 144 lb (65.3 kg)   SpO2 100%   BMI 24.72 kg/m  Objective:   Physical Exam Cardiovascular:     Rate and Rhythm: Normal rate and regular rhythm.  Pulmonary:     Effort: Pulmonary effort is normal.     Breath sounds: Normal breath sounds.  Musculoskeletal:     Cervical back: Neck supple.  Skin:    General: Skin is warm and dry.  Neurological:     Mental Status: She is alert.     Comments: Follows some commands.  Alert to person and place, not to time.  See MMSE          Assessment & Plan:

## 2021-07-11 NOTE — Assessment & Plan Note (Signed)
Deteriorated with MMSE score of 14 today. Discussed results with patient and family.  We had a long discussion regarding the normal progression of dementia and treatment options.  We will start by increasing her citalopram to 20 mg as family has seen some benefit from this.  We discussed to stop Benadryl, start hydroxyzine 10 to 20 mg at bedtime as needed for anxiety and sleep.  Consider addition of Namenda 5 mg twice daily.   We will run her urinalysis with a culture.   Agree to complete cognitive questionnaire form per her insurance company for home health aide coverage.  Family will update in 1 month.

## 2021-07-11 NOTE — Assessment & Plan Note (Signed)
Slight improvement.  Increase citalopram to 20 mg daily. We will update 1 month

## 2021-07-11 NOTE — Telephone Encounter (Signed)
Ppw faxed and sent to scan

## 2021-07-11 NOTE — Telephone Encounter (Signed)
Completed and placed in Joellen's inbox.  We need to fax to the number listed on the form.

## 2021-07-11 NOTE — Assessment & Plan Note (Signed)
Repeat B12 level pending.  Continue injections as prescribed.

## 2021-07-12 LAB — URINE CULTURE
MICRO NUMBER:: 13458413
Result:: NO GROWTH
SPECIMEN QUALITY:: ADEQUATE

## 2021-07-26 NOTE — Telephone Encounter (Signed)
We received another copy of the "Cognitive questionnaire signed and completed by a physician" form.  I called [employer] and spoke to [place], who stated they have received this faxed questionnaire and we can disregard this request.

## 2021-07-31 ENCOUNTER — Other Ambulatory Visit: Payer: Self-pay | Admitting: Primary Care

## 2021-07-31 DIAGNOSIS — E785 Hyperlipidemia, unspecified: Secondary | ICD-10-CM

## 2021-08-10 ENCOUNTER — Ambulatory Visit: Payer: Medicare PPO | Admitting: Podiatry

## 2021-08-10 DIAGNOSIS — M79675 Pain in left toe(s): Secondary | ICD-10-CM

## 2021-08-10 DIAGNOSIS — B351 Tinea unguium: Secondary | ICD-10-CM | POA: Diagnosis not present

## 2021-08-10 DIAGNOSIS — M79674 Pain in right toe(s): Secondary | ICD-10-CM | POA: Diagnosis not present

## 2021-08-17 NOTE — Progress Notes (Signed)
  Subjective:  Patient ID: [name], female    DOB: 1929/09/09,  MRN: [id number]  No chief complaint on file.  88 y.o. female returns for the above complaint.  Patient presents with thickened elongated dystrophic toenails x10.  Pain on palpation.  She is a diabetic.  She wanted to get it evaluated.  She has not seen anyone else prior to seeing me.  She would like to have the nails debrided down.  Her last A1c was 6.0  Objective:   There were no vitals filed for this visit.  Podiatric Exam: Vascular: dorsalis pedis and posterior tibial pulses are palpable bilateral. Capillary return is immediate. Temperature gradient is WNL. Skin turgor WNL  Sensorium: Normal Semmes Weinstein monofilament test. Normal tactile sensation bilaterally. Nail Exam: Pt has thick disfigured discolored nails with subungual debris noted bilateral entire nail hallux through fifth toenails.  Pain on palpation to the nails. Ulcer Exam: There is no evidence of ulcer or pre-ulcerative changes or infection. Orthopedic Exam: Muscle tone and strength are WNL. No limitations in general ROM. No crepitus or effusions noted.  Skin: No Porokeratosis. No infection or ulcers    Assessment & Plan:   No diagnosis found.   Patient was evaluated and treated and all questions answered.  Onychomycosis with pain  -Nails palliatively debrided as below. -Educated on self-care  Procedure: Nail Debridement Rationale: pain  Type of Debridement: manual, sharp debridement. Instrumentation: Nail nipper, rotary burr. Number of Nails: 10  Procedures and Treatment: Consent by patient was obtained for treatment procedures. The patient understood the discussion of treatment and procedures well. All questions were answered thoroughly reviewed. Debridement of mycotic and hypertrophic toenails, 1 through 5 bilateral and clearing of subungual debris. No ulceration, no infection noted.  Return Visit-Office Procedure: Patient  instructed to return to the office for a follow up visit 3 months for continued evaluation and treatment.  Boneta Lucks, DPM    No follow-ups on file.

## 2021-08-24 ENCOUNTER — Other Ambulatory Visit: Payer: Self-pay | Admitting: Primary Care

## 2021-08-24 DIAGNOSIS — I10 Essential (primary) hypertension: Secondary | ICD-10-CM

## 2021-08-29 ENCOUNTER — Telehealth: Payer: Self-pay | Admitting: Primary Care

## 2021-08-29 NOTE — Telephone Encounter (Signed)
Patient daughter-in-law called and wanted to know if she can get a TB test scheduled. Call back number for her son [phone].

## 2021-08-29 NOTE — Telephone Encounter (Signed)
Called spoke to son has several forms that need to be filled out for day program at [facility]. He is going to send over in my chart so that we are able to review and make sure it is only a TB test that is needed and if we can do a quant gold or if needs to be PPD. Will hold for ppw.

## 2021-08-31 ENCOUNTER — Telehealth: Payer: Self-pay | Admitting: Primary Care

## 2021-08-31 NOTE — Telephone Encounter (Signed)
This is duplicate message. I have placed form in [place] office for review.

## 2021-08-31 NOTE — Telephone Encounter (Signed)
Ppw has been placed in your box for review. Do we need to set up for office visit to fill out or just nurse visit for ppd?

## 2021-08-31 NOTE — Telephone Encounter (Signed)
Daughter came in and dropped off forms  from [facility] for [name]. I placed forms in [employer] folder up front.

## 2021-09-01 ENCOUNTER — Other Ambulatory Visit: Payer: Self-pay | Admitting: Primary Care

## 2021-09-01 DIAGNOSIS — Z1231 Encounter for screening mammogram for malignant neoplasm of breast: Secondary | ICD-10-CM

## 2021-09-01 NOTE — Telephone Encounter (Signed)
I completed my part of the paperwork, there is still a lot of paperwork for the family to complete.  We need to attach the medication list.  Placed in Joellen's inbox for family to pick up.

## 2021-09-04 ENCOUNTER — Telehealth: Payer: Self-pay | Admitting: Primary Care

## 2021-09-04 NOTE — Telephone Encounter (Signed)
I am happy to see her at their earliest convenience.

## 2021-09-04 NOTE — Telephone Encounter (Signed)
[place]auighter -in-law called in per request of Dr Carlis Abbott about health declining of mother in law. She states that she has gotten weaker,not speaking well, and not eating. Would like to know if she needs to be sooner than her physical that's scheduled for 8/3? States for her son to be contacted regarding their mother at [phone].

## 2021-09-05 NOTE — Telephone Encounter (Signed)
Have form will hold for follow up

## 2021-09-05 NOTE — Telephone Encounter (Signed)
Patient has appointment for 7/27.  No further action needed at this time.

## 2021-09-05 NOTE — Telephone Encounter (Signed)
Are we ok making nurse visit for ppd placement for patient? Ppw holding in my box until we can call and set up appointment at same time we let them know ppw is completed.

## 2021-09-05 NOTE — Telephone Encounter (Signed)
Looks like patient has an appointment scheduled on 09/07/2021.

## 2021-09-07 ENCOUNTER — Ambulatory Visit: Payer: Medicare PPO | Admitting: Primary Care

## 2021-09-07 ENCOUNTER — Ambulatory Visit (INDEPENDENT_AMBULATORY_CARE_PROVIDER_SITE_OTHER)
Admission: RE | Admit: 2021-09-07 | Discharge: 2021-09-07 | Disposition: A | Discharge status: Home / Self Care | Payer: Medicare PPO | Source: Ambulatory Visit | Attending: Primary Care | Admitting: Primary Care

## 2021-09-07 VITALS — BP 122/64 | HR 79 | Temp 97.6°F | Ht 64.0 in | Wt 144.0 lb

## 2021-09-07 DIAGNOSIS — F4323 Adjustment disorder with mixed anxiety and depressed mood: Secondary | ICD-10-CM | POA: Diagnosis not present

## 2021-09-07 DIAGNOSIS — Z111 Encounter for screening for respiratory tuberculosis: Secondary | ICD-10-CM | POA: Diagnosis not present

## 2021-09-07 DIAGNOSIS — R0789 Other chest pain: Secondary | ICD-10-CM | POA: Insufficient documentation

## 2021-09-07 DIAGNOSIS — E538 Deficiency of other specified B group vitamins: Secondary | ICD-10-CM

## 2021-09-07 DIAGNOSIS — R413 Other amnesia: Secondary | ICD-10-CM | POA: Diagnosis not present

## 2021-09-07 DIAGNOSIS — R0781 Pleurodynia: Secondary | ICD-10-CM

## 2021-09-07 DIAGNOSIS — I517 Cardiomegaly: Secondary | ICD-10-CM | POA: Diagnosis not present

## 2021-09-07 DIAGNOSIS — S2241XA Multiple fractures of ribs, right side, initial encounter for closed fracture: Secondary | ICD-10-CM | POA: Diagnosis not present

## 2021-09-07 DIAGNOSIS — I700 Atherosclerosis of aorta: Secondary | ICD-10-CM | POA: Diagnosis not present

## 2021-09-07 LAB — POC URINALSYSI DIPSTICK (AUTOMATED)
Bilirubin, UA: NEGATIVE
Blood, UA: NEGATIVE
Glucose, UA: POSITIVE — AB
Ketones, UA: NEGATIVE
Nitrite, UA: NEGATIVE
Protein, UA: POSITIVE — AB
Spec Grav, UA: 1.025 (ref 1.010–1.025)
Urobilinogen, UA: 0.2 E.U./dL
pH, UA: 5.5 (ref 5.0–8.0)

## 2021-09-07 NOTE — Assessment & Plan Note (Signed)
Gradual decline per family, she participates very little in HPI today.  Discussion today regarding goals of care with her son. He does not want to pursue aggressive care at this point.  Offered prescription for Namenda 5 mg BID, he kindly declines.   Paperwork completed for [employer]. TB test pending.  Continue to monitor.

## 2021-09-07 NOTE — Progress Notes (Signed)
Subjective:    Patient ID: Dollene Cleveland, female    DOB: 1929/09/28, 88 y.o.   MRN: [id number]  [facility] Pertinent negatives include no fever.    Marca Emaleigh Guimond is a very pleasant 88 y.o. female with a history of CAD, hypertension, osteoarthritis, CKD, prediabetes, hyperlipidemia, anxiety and depression, memory changes, bilateral lower extremity edema who presents today with her family for follow up. Her son joins [place] who provides most of the information for HPI  She was last evaluated on 07/11/2021 with her family for ongoing memory changes, increased agitation, screaming at night, getting out of bed.  During this visit her Mini-Mental status score was 14.  We decided to increase the dose of her citalopram to 20 mg and adding hydroxyzine at bedtime PRN.  Since her last visit her memory continues to decline. About one week ago her son noticed that she "would space out" when her family was speaking directly to her. This occurred for about 1 week. At the time, her son checked her hand strength and evaluated for facial dropping and there were no signs of either. Since then he's noticed right side weakness to her right upper extremity, imbalance with ambulation, and also a tremor to her hands with movement. She has difficulty holding a utensil and a cup. She now has difficulty putting in her teeth everyday.   She can communicate and participate in simple conversations. She sometimes doesn't know who her son is, most of the time recognizes him.   They have a home health aid coming to the home twice weekly who helps the family to care for her. The aid has also noticed increased weakness and imbalance. Her appetite is overall unchanged, she eats well for the most part. She is much less agitated and is not getting out of bed at night like before.   A few weeks ago she was sitting on the side of the bed, she slipped off the bed and landed onto her right lateral side. Her son didn't see anything  at the time. The home health nurse came a few days later who noticed a bruise to the right breast. She has mentioned right lateral chest wall pain a few times.   She is currently enrolled in [facility] outpatient memory care program. She will participate twice weekly. She is needing a TB testing today to start the program.    Review of Systems  Constitutional:  Negative for fever.  Respiratory:  Negative for shortness of breath.   Musculoskeletal:  Positive for arthralgias.  Neurological:  Positive for tremors.       See HPI  Psychiatric/Behavioral:  Positive for confusion. The patient is not nervous/anxious.          Past Medical History:  Diagnosis Date   Arthritis    Breast cancer ([place]) 2001   left    CAD (coronary artery disease)    Asymptomatic   Diabetes mellitus ([place])    Diverticulosis    Hiatal hernia    History of shingles    ON BACK   Hyperlipidemia    Hypertension    Kidney stone    PVC (premature ventricular contraction)    Vertigo     Social History   Socioeconomic History   Marital status: Widowed    Spouse name: Not on file   Number of children: Not on file   Years of education: Not on file   Highest education level: Not on file  Occupational History  Not on file  Tobacco Use   Smoking status: Never   Smokeless tobacco: Never  Vaping Use   Vaping Use: Never used  Substance and Sexual Activity   Alcohol use: No   Drug use: No   Sexual activity: Not Currently  Other Topics Concern   Not on file  Social History Narrative   Widow.    Lives by herself at home.   Volunteers at [employer].   Enjoys volunteering, helping her neighbors, spending time with family, church.   Social Determinants of Health   Financial Resource Strain: Not on file  Food Insecurity: Not on file  Transportation Needs: Not on file  Physical Activity: Not on file  Stress: Not on file  Social Connections: Not on file  Intimate Partner Violence: Not on file    Past  Surgical History:  Procedure Laterality Date   BREAST LUMPECTOMY Left 2001   BREAST LUMPECTOMY WITH AXILLARY LYMPH NODE DISSECTION  2003   left   CARDIAC CATHETERIZATION  07/13/2009   Recommendation - medical therapy   CARDIOVASCULAR STRESS TEST  06/16/2009   Mild ischemia demonstrated in the basal inferior, mid inferior, and apical inferior regions.   CATARACTS REMOVED     CHOLECYSTECTOMY     JOINT REPLACEMENT     BILATERAL TOTAL KNEES   KNEE ARTHROPLASTY Right 2009   TOTAL HIP ARTHROPLASTY Left 11/03/2014   Procedure: LEFT TOTAL HIP ARTHROPLASTY ANTERIOR APPROACH;  Surgeon: Gaynelle Arabian, MD;  Location: [facility];  Service: Orthopedics;  Laterality: Left;   TRANSTHORACIC ECHOCARDIOGRAM  06/16/2009   EF >55%, no significant valvular abnormalities.    Family History  Problem Relation Age of Onset   Heart attack Mother    Cancer Mother    Stroke Father    Stroke Brother    Stroke Brother     No Known Allergies  Current Outpatient Medications on File Prior to Visit  Medication Sig Dispense Refill   losartan (COZAAR) 50 MG tablet TAKE 1 TABLET BY MOUTH ONCE DAILY FOR BLOOD PRESSURE 90 tablet 2   atorvastatin (LIPITOR) 10 MG tablet TAKE 1 TABLET BY MOUTH ONCE DAILY EVERY EVENING FOR CHOLESTEROL 90 tablet 0   citalopram (CELEXA) 20 MG tablet Take 1 tablet (20 mg total) by mouth daily. For anxiety 90 tablet 0   hydrOXYzine (ATARAX) 10 MG tablet Take 1-2 tablets (10-20 mg total) by mouth at bedtime as needed for anxiety (or sleep). 90 tablet 0   triamcinolone cream (KENALOG) 0.1 % Apply 1 application topically 2 (two) times daily. 30 g 0   No current facility-administered medications on file prior to visit.    BP 122/64   Pulse 79   Temp 97.6 F (36.4 C) (Oral)   Ht '5\' 4"'$  (1.626 m)   Wt 144 lb (65.3 kg)   SpO2 100%   BMI 24.72 kg/m  Objective:   Physical Exam Cardiovascular:     Rate and Rhythm: Normal rate and regular rhythm.  Pulmonary:     Effort: Pulmonary effort is normal.      Breath sounds: Normal breath sounds.  Chest:     Chest wall: Tenderness present. No deformity.       Comments: Pain to right lateral chest wall with palpation and movement of right upper extremity  Musculoskeletal:     Cervical back: Neck supple.  Skin:    General: Skin is warm and dry.     Findings: No bruising.  Neurological:     Mental Status: She is  alert.     Comments: Alert, follows most commands. Little participation in HPI  Psychiatric:        Mood and Affect: Mood normal.           Assessment & Plan:   Problem List Items Addressed This Visit       Other   Adjustment disorder with mixed anxiety and depressed mood    Improved.  Continue citalopram 20 mg daily.      Memory changes - Primary    Gradual decline per family, she participates very little in HPI today.  Discussion today regarding goals of care with her son. He does not want to pursue aggressive care at this point.  Offered prescription for Namenda 5 mg BID, he kindly declines.   Paperwork completed for [employer]. TB test pending.  Continue to monitor.      Relevant Orders   POCT Urinalysis Dipstick (Automated) (Completed)   Urine Culture   Vitamin B12 deficiency    Improving.  Continue B12 1000 mcg daily. Repeat B12 level pending      Relevant Orders   Vitamin B12   Rib pain on right side    Concerning for fractures given fall and tenderness on exam. Xray of right ribs pending.      Relevant Orders   DG Ribs Unilateral Right   Other Visit Diagnoses     Screening for tuberculosis       Relevant Orders   QuantiFERON-TB Gold Plus          Pleas Koch, NP

## 2021-09-07 NOTE — Patient Instructions (Signed)
Complete xray(s) and labs prior to leaving today. I will notify you of your results once received.  It was a pleasure to see you today!

## 2021-09-07 NOTE — Assessment & Plan Note (Signed)
Concerning for fractures given fall and tenderness on exam. Xray of right ribs pending.

## 2021-09-07 NOTE — Assessment & Plan Note (Signed)
Improving.  Continue B12 1000 mcg daily. Repeat B12 level pending

## 2021-09-07 NOTE — Assessment & Plan Note (Signed)
Improved.  Continue citalopram 20 mg daily.

## 2021-09-08 ENCOUNTER — Encounter: Payer: Medicare PPO | Admitting: Primary Care

## 2021-09-08 LAB — URINE CULTURE
MICRO NUMBER:: 13702984
SPECIMEN QUALITY:: ADEQUATE

## 2021-09-08 LAB — VITAMIN B12: Vitamin B-12: 461 pg/mL (ref 211–911)

## 2021-09-11 ENCOUNTER — Telehealth: Payer: Self-pay | Admitting: Primary Care

## 2021-09-11 LAB — QUANTIFERON-TB GOLD PLUS
Mitogen-NIL: >10 IU/mL
NIL: 0.05 IU/mL
QuantiFERON-TB Gold Plus: NEGATIVE
TB1-NIL: 0.01 IU/mL
TB2-NIL: <0 IU/mL

## 2021-09-11 NOTE — Telephone Encounter (Signed)
[facility] called to make sure NP saw the results for the test the patient had done on 7.27.23, since we are closed on the weekend.

## 2021-09-12 NOTE — Telephone Encounter (Signed)
I did see these results, but I was not provided with a phone call as noted in the xray report. I also do not see documentation where anyone took the call.   Patient and family are aware of results and instructions moving forward.

## 2021-09-14 ENCOUNTER — Encounter: Payer: Medicare PPO | Admitting: Primary Care

## 2021-09-26 DIAGNOSIS — Z87442 Personal history of urinary calculi: Secondary | ICD-10-CM | POA: Diagnosis not present

## 2021-09-26 DIAGNOSIS — N1831 Chronic kidney disease, stage 3a: Secondary | ICD-10-CM | POA: Diagnosis not present

## 2021-09-26 DIAGNOSIS — E1122 Type 2 diabetes mellitus with diabetic chronic kidney disease: Secondary | ICD-10-CM | POA: Diagnosis not present

## 2021-09-26 DIAGNOSIS — I10 Essential (primary) hypertension: Secondary | ICD-10-CM | POA: Diagnosis not present

## 2021-09-28 ENCOUNTER — Ambulatory Visit: Payer: Medicare PPO

## 2021-09-28 ENCOUNTER — Telehealth: Payer: Self-pay | Admitting: Nurse Practitioner

## 2021-09-28 NOTE — Telephone Encounter (Signed)
Spoke with son Rhys Anchondo and discussed Palliative services and he was in agreement with this.  Since his wife, Selinda Eon, is there with patient during the day he requested I call and speak with her to schedule the visit.  Attempted to contact Selinda Eon to schedule visit, no answer and unable to leave a message due to mailbox was full.

## 2021-09-28 NOTE — Telephone Encounter (Signed)
Rec'd call from patient's daughter-in-law Maiko Salais and have scheduled a MyChart Consult for 10/11/21 @ 11 AM.

## 2021-10-03 ENCOUNTER — Other Ambulatory Visit: Payer: Self-pay | Admitting: Primary Care

## 2021-10-03 DIAGNOSIS — R413 Other amnesia: Secondary | ICD-10-CM

## 2021-10-11 ENCOUNTER — Telehealth: Payer: Medicare PPO | Admitting: Nurse Practitioner

## 2021-10-11 ENCOUNTER — Encounter: Payer: Self-pay | Admitting: Nurse Practitioner

## 2021-10-11 DIAGNOSIS — F02818 Dementia in other diseases classified elsewhere, unspecified severity, with other behavioral disturbance: Secondary | ICD-10-CM

## 2021-10-11 DIAGNOSIS — R451 Restlessness and agitation: Secondary | ICD-10-CM | POA: Diagnosis not present

## 2021-10-11 DIAGNOSIS — R634 Abnormal weight loss: Secondary | ICD-10-CM

## 2021-10-11 DIAGNOSIS — R443 Hallucinations, unspecified: Secondary | ICD-10-CM

## 2021-10-11 DIAGNOSIS — Z515 Encounter for palliative care: Secondary | ICD-10-CM

## 2021-10-11 DIAGNOSIS — R630 Anorexia: Secondary | ICD-10-CM | POA: Diagnosis not present

## 2021-10-11 NOTE — Progress Notes (Signed)
[facility] Consult Note Telephone: [phone]  Fax: [phone]   Date of encounter: 10/11/21 8:15 PM PATIENT NAME: Yamilett Anastos [place] [id number]   [phone] (home)  DOB: 02-Feb-1930 MRN: [id number] PRIMARY CARE PROVIDER:    Pleas Koch, NP,  [place] [address] [phone]  REFERRING PROVIDER:   Pleas Koch, NP [place],  [place] [address] [phone]  RESPONSIBLE PARTY:    Contact Information     Name Relation Home Work Mobile   [place] Daughter [phone]  [phone]   [place], [place]   [phone]      Due to the COVID-19 crisis, this visit was done via telemedicine from my office and it was initiated and consent by this patient and or family.  I connected with daughter in law, Sharyn Lull with  [facility] on 10/11/21 by a video enabled telemedicine application and verified that I am speaking with the correct person using two identifiers.   I discussed the limitations of evaluation and management by telemedicine. The patient expressed understanding and agreed to proceed. Palliative Care was asked to follow this patient by consultation request of  Pleas Koch, NP to address advance care planning and complex medical decision making. This is the initial visit.                     ASSESSMENT AND PLAN / RECOMMENDATIONS:  Symptom Management/Plan: 1. Advance Care Planning;  Ongoing discussions; Discussed Hospice benefit under Medicare program. Will schedule f/u visit with Sharyn Lull, Ms. Kye and son tomorrow for further discussion. We did talk about how the program works with Sharyn Lull being very familiar due to her mother just passed with hospice services at home then transferred to [facility].   2. Goals of Care: Goals include to maximize quality of life and symptom management. Our advance care planning conversation included  a discussion about:    The value and importance of advance care planning  Exploration of personal, cultural or spiritual beliefs that might influence medical decisions  Exploration of goals of care in the event of a sudden injury or illness  Identification and preparation of a healthcare agent  Review and updating or creation of an advance directive document.  3. Anorexia; weight loss reported by family 50 lbs + over last 2 years with in last year going from a size 28; women XL to women's small in sizes <8. Current listed weight with last primary visit was 09/07/2021 listed 144 lbs but Sharyn Lull endorses she did not think was an accurate weight. Appetite is decreased eating <25 to 50% at meals. Sharyn Lull endorses she does eat better for her son. Sharyn Lull endorses with recent rib fx's her appetite has declined more. Nutritional education complet  4. Agitation with hallucinations in the setting of dementia; currently taking hydroxyzine; discussed at length Sharyn Lull endorses is not effective, the hallucinations are worsening. Discussed and reviewed medications, alternative modalities; we talked about disease progression of dementia with behaviors. Discussed starting seroquel scheduled with haldol prn; Sharyn Lull in agreement; will send rx's Rx: Seroquel 25 mg bid (discussed with Sharyn Lull to start with 1/2 tablet 12.'5mg'$  bid for 2 weeks then if no improvement increase to '25mg'$  bid); #60; No RF escribed  Rx: Haldol '2mg'$  q8hrs as needed for severe agitation; #30 No RF; escribed 5. Palliative care encounter; Palliative care encounter; Palliative medicine team will continue to support patient, patient's  family, and medical team. Visit consisted of counseling and education dealing with the complex and emotionally intense issues of symptom management and palliative care in the setting of serious and potentially life-threatening illness Follow up Palliative Care Visit: Palliative care will continue to follow for complex  medical decision making, advance care planning, and clarification of goals. Return 2 weeks or prn.  I spent 61 minutes providing this consultation. More than 50% of the time in this consultation was spent in counseling and care coordination.  PPS: 40%  Chief Complaint: Initial palliative consult for complex medical decision making, address goals, manage ongoing symptoms  HISTORY OF PRESENT ILLNESS:  Taraneh Collin Hendley is a 88 y.o. year old female  with multiple medical problems including dementia with visual hallucinations; HTN; HLD; h/o left breast cancer, CAD, DM, h/o hiatal hernia, h/o shingles, h/o kidney stones. I connected with Sharyn Lull, Ms. Bilyeu's daughter in law with Ms. Campo. We talked about the last time Ms. Gugel has been living alone, independent which has been over a year ago. Sharyn Lull endorses she and he husband live with Ms. Srinivasan. We talked about past medical history, events that lead up to not being able to stay by herself. We talked about ros including recent rib fractures which pain has improved. We talked about frequent falling, functional ability has to be lifted up out of a chair to a standing position to take a few steps with a walker. She requires to be lifted to a standing position to be pivoted. She requires to be bathed, dressed. She requires prompting for feeding, decrease appetite with significant change in weight reported over 50 lbs loss; down 5 clothes sizes; We talked about appetite, weights, nutrition, supplements at length. We talked about incontinence of bowel and bladder reported by [facility]. We talked about daily routine. We talked about and reviewed social, family history. We talked about her daughter [name] and son Harrie Jeans. We talked about medical goals at length. We talked about Hospice benefit under Medicare with services provided which Sharyn Lull was familiar with. We talked about chronic disease progression with worsening agitation, medications reviewed and discussed  including with increase in agitation, no improvement in symptoms of agitation or hallucinations with hydroxyzine will d/c and start seroquel scheduled with haldol prn. We talked about f/u pc visit tomorrow with son to further revisit goc, option of hospice, further discussion of disease progression. Sharyn Lull in agreement, appointment scheduled. Therapeutic listening, emotional support provided. Questions answered. Contact information provided.   History obtained from review of EMR, discussion with primary team, and interview with family, facility staff/caregiver and/or Ms. Ghosh.  I reviewed available labs, medications, imaging, studies and related documents from the EMR.  Records reviewed and summarized above.   ROS 10 point system reviewed all negative except HPI  Physical Exam: Deferred CURRENT PROBLEM LIST:  Patient Active Problem List   Diagnosis Date Noted   Rib pain on right side 09/07/2021   Vitamin B12 deficiency 07/11/2021   Bilateral lower extremity edema 05/23/2021   Memory changes 05/23/2021   Lipoma of extremity 03/10/2021   Tick bite of abdominal wall 07/26/2020   Renal stone 12/17/2018   Adjustment disorder with mixed anxiety and depressed mood 11/24/2018   Constipation 11/24/2018   Preventative health care 08/08/2018   Stage 3 chronic kidney disease ([place]) 02/19/2018   Medicare annual wellness visit, subsequent 06/30/2015   OA (osteoarthritis) of hip 11/03/2014   Ventricular bigeminy 12/16/2012   CAD (coronary artery disease) 12/16/2012   HTN (hypertension) 12/16/2012  Hyperlipidemia 12/16/2012   Prediabetes 12/16/2012   Dizziness of unknown cause 12/16/2012   PAST MEDICAL HISTORY:  Active Ambulatory Problems    Diagnosis Date Noted   Ventricular bigeminy 12/16/2012   CAD (coronary artery disease) 12/16/2012   HTN (hypertension) 12/16/2012   Hyperlipidemia 12/16/2012   Prediabetes 12/16/2012   Dizziness of unknown cause 12/16/2012   OA (osteoarthritis) of hip  11/03/2014   Medicare annual wellness visit, subsequent 06/30/2015   Stage 3 chronic kidney disease ([place]) 02/19/2018   Preventative health care 08/08/2018   Adjustment disorder with mixed anxiety and depressed mood 11/24/2018   Constipation 11/24/2018   Renal stone 12/17/2018   Tick bite of abdominal wall 07/26/2020   Lipoma of extremity 03/10/2021   Bilateral lower extremity edema 05/23/2021   Memory changes 05/23/2021   Vitamin B12 deficiency 07/11/2021   Rib pain on right side 09/07/2021   Resolved Ambulatory Problems    Diagnosis Date Noted   No Resolved Ambulatory Problems   Past Medical History:  Diagnosis Date   Arthritis    Breast cancer ([place]) 2001   Diabetes mellitus ([place])    Diverticulosis    Hiatal hernia    History of shingles    Hypertension    Kidney stone    PVC (premature ventricular contraction)    Vertigo    SOCIAL HX:  Social History   Tobacco Use   Smoking status: Never   Smokeless tobacco: Never  Substance Use Topics   Alcohol use: No   FAMILY HX:  Family History  Problem Relation Age of Onset   Heart attack Mother    Cancer Mother    Stroke Father    Stroke Brother    Stroke Brother       ALLERGIES: No Known Allergies   PERTINENT MEDICATIONS:  Outpatient Encounter Medications as of 10/11/2021  Medication Sig   losartan (COZAAR) 50 MG tablet TAKE 1 TABLET BY MOUTH ONCE DAILY FOR BLOOD PRESSURE   atorvastatin (LIPITOR) 10 MG tablet TAKE 1 TABLET BY MOUTH ONCE DAILY EVERY EVENING FOR CHOLESTEROL   citalopram (CELEXA) 20 MG tablet Take 1 tablet (20 mg total) by mouth daily. For anxiety   hydrOXYzine (ATARAX) 10 MG tablet TAKE 1 TO 2 TABLETS BY MOUTH AT BEDTIME AS NEEDED FOR ANXIETY OR SLEEP   triamcinolone cream (KENALOG) 0.1 % Apply 1 application topically 2 (two) times daily.   No facility-administered encounter medications on file as of 10/11/2021.   Thank you for the opportunity to participate in the care of Ms. Mccombs.  The palliative  care team will continue to follow. Please call our office at [phone] if we can be of additional assistance.   Kya Mayfield Z Laura-Lee Villegas, NP ,

## 2021-10-12 ENCOUNTER — Encounter: Payer: Self-pay | Admitting: Nurse Practitioner

## 2021-10-12 ENCOUNTER — Telehealth: Payer: Medicare PPO | Admitting: Nurse Practitioner

## 2021-10-12 DIAGNOSIS — Z515 Encounter for palliative care: Secondary | ICD-10-CM

## 2021-10-12 DIAGNOSIS — R634 Abnormal weight loss: Secondary | ICD-10-CM

## 2021-10-12 DIAGNOSIS — R451 Restlessness and agitation: Secondary | ICD-10-CM

## 2021-10-12 DIAGNOSIS — R443 Hallucinations, unspecified: Secondary | ICD-10-CM

## 2021-10-12 DIAGNOSIS — R630 Anorexia: Secondary | ICD-10-CM

## 2021-10-12 DIAGNOSIS — F02818 Dementia in other diseases classified elsewhere, unspecified severity, with other behavioral disturbance: Secondary | ICD-10-CM

## 2021-10-12 NOTE — Progress Notes (Signed)
[facility] Consult Note Telephone: [phone]  Fax: [phone]    Date of encounter: 10/12/21 1:37 PM PATIENT NAME: [name] [name] [id number]   [phone] (home)  DOB: [name] MRN: [id number] PRIMARY CARE PROVIDER:    Pleas Koch, NP,  [place] [address] [phone]  RESPONSIBLE PARTY:    Contact Information     Name Relation Home Work Mobile   [name] Daughter [phone]  [phone]   [name]   [phone]     Due to the COVID-19 crisis, this visit was done via telemedicine from my office and it was initiated and consent by this patient and or family.   I connected with daughter in law, [name], son [name] with  [facility] on 10/11/21 by a video enabled telemedicine application and verified that I am speaking with the correct person using two identifiers.   I discussed the limitations of evaluation and management by telemedicine. The patient expressed understanding and agreed to proceed. Palliative Care was asked to follow this patient by consultation request of  Kari Koch, NP to address advance care planning and complex medical decision making. This is the initial visit.                     ASSESSMENT AND PLAN / RECOMMENDATIONS:  Symptom Management/Plan: 1. Advance Care Planning;  Wishes are for DNR, goldenrod form completed, will put in vynca; mail to Mr Shearman; We talked about medical goal, revisited hospice benefit under medicare program. Will continue to follow for now to see how she does with the day care program.    2. Goals of Care: Goals include to maximize quality of life and symptom management. Our advance care planning conversation included a discussion about:    The value and importance of advance care planning  Exploration of personal, cultural or spiritual beliefs that might influence medical decisions   Exploration of goals of care in the event of a sudden injury or illness  Identification and preparation of a healthcare agent  Review and updating or creation of an advance directive document.   3. Anorexia; reviewed with [name]; weight loss reported by family 50 lbs + over last 2 years with in last year going from a size 38; women XL to women's small in sizes <8. Current listed weight with last primary visit was 09/07/2021 listed 144 lbs but [name] endorses she did not think was an accurate weight. Appetite is decreased eating <25 to 50% at meals unless [name] is feeding [name] she will then eat more. Nutritional education complet   4. Agitation with hallucinations in the setting of dementia; We reviewed medications, revisited seroquel low dose may start at bedtime; the hallucinations are worsening. We talked about haldol only in cases of severe agitation or scary causing suffering hallucinations, Discussed and reviewed medications, alternative modalities; we talked about disease progression of dementia with behaviors. We talked about close monitoring with f/u visit scheduled. We talked about continue adult daycare at [facility] for now as she is in probationary period to see if will be able to stay though exhausts her. We talked about medical goal, revisited hospice benefit under medicare program. Will continue to follow for now to see how she does with the day care program.   5. Palliative care encounter; Palliative care encounter; Palliative medicine team will continue to support patient, patient's family, and medical  team. Visit consisted of counseling and education dealing with the complex and emotionally intense issues of symptom management and palliative care in the setting of serious and potentially life-threatening illness Follow up Palliative Care Visit: Palliative care will continue to follow for complex medical decision making, advance care planning, and clarification of goals. Return 2  weeks or prn.   I spent 41 minutes providing this consultation. More than 50% of the time in this consultation was spent in counseling and care coordination.   PPS: 40%   Chief Complaint: Initial palliative consult for complex medical decision making, address goals, manage ongoing symptoms   HISTORY OF PRESENT ILLNESS:  [name] is a 88 y.o. year old female  with multiple medical problems including dementia with visual hallucinations; HTN; HLD; h/o left breast cancer, CAD, DM, h/o hiatal hernia, h/o shingles, h/o kidney stones. I connected with [name], [name] daughter in law, son [name] with [name]. We revisited visit yesterday, reviewed ros, functional and cognitive decline, medical goals including hospice benefit under medicare. We talked about [facility] daycare program she does one a week. We talked about chronic disease progression with ongoing decline and worsening hallucinations. Medications reviewed and discussed at length. We talked about appetite, weight loss, and nutrition. We talked about f/u pc visit, scheduled with [facility] in agreement, appointment scheduled. Therapeutic listening, emotional support provided. Questions answered. Contact information provided.    History obtained from review of EMR, discussion with primary team, and interview with family, facility staff/caregiver and/or [name].  I reviewed available labs, medications, imaging, studies and related documents from the EMR.  Records reviewed and summarized above.    ROS 10 point system reviewed all negative except HPI   Physical Exam: Deferred  Thank you for the opportunity to participate in the care of [name].  The palliative care team will continue to follow. Please call our office at [phone] if we can be of additional assistance.   Kari Cozby Ihor Gully, NP

## 2021-10-23 ENCOUNTER — Other Ambulatory Visit: Payer: Self-pay | Admitting: Primary Care

## 2021-10-23 DIAGNOSIS — R413 Other amnesia: Secondary | ICD-10-CM

## 2021-10-23 DIAGNOSIS — F4323 Adjustment disorder with mixed anxiety and depressed mood: Secondary | ICD-10-CM

## 2021-11-01 ENCOUNTER — Other Ambulatory Visit: Payer: Self-pay | Admitting: Primary Care

## 2021-11-01 DIAGNOSIS — E785 Hyperlipidemia, unspecified: Secondary | ICD-10-CM

## 2021-11-02 ENCOUNTER — Telehealth: Payer: Medicare PPO | Admitting: Nurse Practitioner

## 2021-11-02 ENCOUNTER — Encounter: Payer: Self-pay | Admitting: Nurse Practitioner

## 2021-11-02 ENCOUNTER — Other Ambulatory Visit: Payer: Self-pay | Admitting: Primary Care

## 2021-11-02 DIAGNOSIS — R634 Abnormal weight loss: Secondary | ICD-10-CM

## 2021-11-02 DIAGNOSIS — R443 Hallucinations, unspecified: Secondary | ICD-10-CM

## 2021-11-02 DIAGNOSIS — R413 Other amnesia: Secondary | ICD-10-CM

## 2021-11-02 DIAGNOSIS — F03B0 Unspecified dementia, moderate, without behavioral disturbance, psychotic disturbance, mood disturbance, and anxiety: Secondary | ICD-10-CM

## 2021-11-02 DIAGNOSIS — R451 Restlessness and agitation: Secondary | ICD-10-CM

## 2021-11-02 DIAGNOSIS — R630 Anorexia: Secondary | ICD-10-CM

## 2021-11-02 DIAGNOSIS — F02818 Dementia in other diseases classified elsewhere, unspecified severity, with other behavioral disturbance: Secondary | ICD-10-CM

## 2021-11-02 NOTE — Addendum Note (Signed)
Addended by: Shawn Stall on: 11/02/2021 12:42 PM   Modules accepted: Level of Service

## 2021-11-02 NOTE — Progress Notes (Signed)
[facility] Consult Note Telephone: [phone]  Fax: [phone]    Date of encounter: 11/02/21 12:20 PM PATIENT NAME: [name] [id number]   [phone] (home)  DOB: 14-Oct-1929 MRN: [id number] PRIMARY CARE PROVIDER:    Pleas Koch, NP,  [place] [address] [phone]  RESPONSIBLE PARTY:    Contact Information     Name Relation Home Work Mobile   [place] Daughter [phone]  [phone]   Kari, White   [phone]          Due to the COVID-19 crisis, this visit was done via telemedicine from my office and it was initiated and consent by this patient and or family.   I connected with daughter in law, [name], son [name] with  [name] [facility] on 10/11/21 by a video enabled telemedicine application and verified that I am speaking with the correct person using two identifiers.   I discussed the limitations of evaluation and management by telemedicine. The patient expressed understanding and agreed to proceed. Palliative Care was asked to follow this patient by consultation request of  Kari Koch, NP to address advance care planning and complex medical decision making. This is the initial visit.                     ASSESSMENT AND PLAN / RECOMMENDATIONS:   Wishes are for hospice:   Weight loss reported by family 50 lbs + over last 2 years with in last year going from a size 8; women XL to women's small in sizes <8. Current listed weight with last primary visit was 09/07/2021 listed 144 lbs NOT accurate weight. Appetite is decreased eating <25 to 50% at meals unless Does require to be fed.  3 months ago, feeding self Now requires to be fed, prompted to eat, pocketing food, dysphagia  6 months ago FAST 6a Now FAST 7B   6 months ago PPS 50%/Helping get dressed, feeding herself, toilet herself Now low 40%; requires to be lifted  to standing position, total care for bathing, dressing, mobility, incontinence bowel and bladder, requires to be fed.  Worsening dementia; increase in hallucinations/agitation, talking to people who have passed; requiring initiating seroquel and haldol; sleeping >16 hrs daily if allowed;   Symptom Management/Plan: 1. Advance Care Planning;  Wishes are for DNR   2. Anorexia; weight loss reported by family 50 lbs + over last 2 years with in last year going from a size 62; women XL to women's small in sizes <8. Current listed weight with last primary visit was 09/07/2021 listed 144 lbs NOT accurate weight. Appetite is decreased eating <25 to 50% at meals unless Does require to be fed.   4. Agitation with hallucinations in the setting of dementia worsening; We reviewed medications, Seroquel at bedtime and receiving haldol prn during the day which does help; Talked about chronic disease progression of dementia, realistic expectations.    5. Palliative care encounter; Palliative care encounter; Palliative medicine team will continue to support patient, patient's family, and medical team. Visit consisted of counseling and education dealing with the complex and emotionally intense issues of symptom management and palliative care in the setting of serious and potentially life-threatening illness   I spent 32 minutes providing this consultation. More than 50% of the time in this consultation was spent in counseling and care coordination.   PPS: 40% low   Chief Complaint:  Follow up palliative consult for complex medical decision making, address goals, manage ongoing symptoms   HISTORY OF PRESENT ILLNESS:  [name] is a 88 y.o. year old female  with multiple medical problems including dementia with visual hallucinations; HTN; HLD; h/o left breast cancer, CAD, DM, h/o hiatal hernia, h/o shingles, h/o kidney stones. I connected with [name], [name]'s daughter in law,  with [name]. We talked about  purpose of PC visit, ros, symptoms including worsening hallucinations, behaviors, ongoing decrease appetite, weight loss; functional, cognitive decline.  We talked about chronic disease progression with ongoing decline and worsening hallucinations. Medications reviewed and discussed at length. [name], son did start seroquel and haldol which has seemed to help a little, overall decline progressive. We talked about appetite, weight loss, and nutrition. We talked about revisiting option of hospice and [name] in agreement, reviewed services. Notified [name]; NP and order sent for hospice av evaluation to see for eligibility. Therapeutic listening, emotional support provided. Questions answered. Contact information provided.    History obtained from review of EMR, discussion with primary team, and interview with family, facility staff/caregiver and/or [name].  I reviewed available labs, medications, imaging, studies and related documents from the EMR.  Records reviewed and summarized above.    ROS 10 point system reviewed all negative except HPI   Physical Exam: Deferred Thank you for the opportunity to participate in the care of [name].  The palliative care team will continue to follow. Please call our office at [phone] if we can be of additional assistance.   [name] Ihor Gully, NP

## 2021-11-14 ENCOUNTER — Ambulatory Visit: Payer: Medicare PPO | Admitting: Podiatry

## 2021-11-14 DIAGNOSIS — M79675 Pain in left toe(s): Secondary | ICD-10-CM | POA: Diagnosis not present

## 2021-11-14 DIAGNOSIS — B351 Tinea unguium: Secondary | ICD-10-CM | POA: Diagnosis not present

## 2021-11-14 DIAGNOSIS — M79674 Pain in right toe(s): Secondary | ICD-10-CM

## 2021-11-15 NOTE — Progress Notes (Signed)
  Subjective:  Patient ID: [name], female    DOB: 08-Oct-1929,  MRN: [id number]  Chief Complaint  Patient presents with   Nail Problem   88 y.o. female returns for the above complaint.  Patient presents with thickened elongated dystrophic toenails x10.  Pain on palpation.  She is a diabetic.  She wanted to get it evaluated.  She has not seen anyone else prior to seeing me.  She would like to have the nails debrided down.  Her last A1c was 6.0  Objective:   There were no vitals filed for this visit.  Podiatric Exam: Vascular: dorsalis pedis and posterior tibial pulses are palpable bilateral. Capillary return is immediate. Temperature gradient is WNL. Skin turgor WNL  Sensorium: Normal Semmes Weinstein monofilament test. Normal tactile sensation bilaterally. Nail Exam: Pt has thick disfigured discolored nails with subungual debris noted bilateral entire nail hallux through fifth toenails.  Pain on palpation to the nails. Ulcer Exam: There is no evidence of ulcer or pre-ulcerative changes or infection. Orthopedic Exam: Muscle tone and strength are WNL. No limitations in general ROM. No crepitus or effusions noted.  Skin: No Porokeratosis. No infection or ulcers    Assessment & Plan:   1. Pain due to onychomycosis of toenails of both feet      Patient was evaluated and treated and all questions answered.  Onychomycosis with pain  -Nails palliatively debrided as below. -Educated on self-care  Procedure: Nail Debridement Rationale: pain  Type of Debridement: manual, sharp debridement. Instrumentation: Nail nipper, rotary burr. Number of Nails: 10  Procedures and Treatment: Consent by patient was obtained for treatment procedures. The patient understood the discussion of treatment and procedures well. All questions were answered thoroughly reviewed. Debridement of mycotic and hypertrophic toenails, 1 through 5 bilateral and clearing of subungual debris. No ulceration, no  infection noted.  Return Visit-Office Procedure: Patient instructed to return to the office for a follow up visit 3 months for continued evaluation and treatment.  Boneta Lucks, DPM    No follow-ups on file.

## 2022-02-13 ENCOUNTER — Ambulatory Visit: Payer: Medicare PPO | Admitting: Podiatry

## 2022-03-12 ENCOUNTER — Other Ambulatory Visit: Payer: Self-pay | Admitting: Primary Care

## 2022-03-12 DIAGNOSIS — R413 Other amnesia: Secondary | ICD-10-CM

## 2022-03-12 DIAGNOSIS — F4323 Adjustment disorder with mixed anxiety and depressed mood: Secondary | ICD-10-CM

## 2022-04-23 ENCOUNTER — Telehealth: Payer: Self-pay

## 2022-04-23 NOTE — Patient Outreach (Signed)
  Care Coordination   04/23/2022 Name: Niaja Stickley MRN: [id number] DOB: [birth year]   Care Coordination Outreach Attempts:  An unsuccessful telephone outreach was attempted today to offer the patient information about available care coordination services as a benefit of their health plan.   Follow Up Plan:  Additional outreach attempts will be made to offer the patient care coordination information and services.   Encounter Outcome:  No Answer   Care Coordination Interventions:  No, not indicated    Quinn Plowman [facility] [place] [phone] direct line

## 2022-07-14 DEATH — deceased
